# Patient Record
Sex: Female | Born: 1935 | Race: White | Hispanic: No | Smoking: Former smoker
Health system: Southern US, Community
[De-identification: ages and names within clinical notes are randomized; demographics above are authoritative.]

## PROBLEM LIST (undated history)

## (undated) DIAGNOSIS — I739 Peripheral vascular disease, unspecified: Secondary | ICD-10-CM

## (undated) DIAGNOSIS — I480 Paroxysmal atrial fibrillation: Secondary | ICD-10-CM

## (undated) DIAGNOSIS — I499 Cardiac arrhythmia, unspecified: Secondary | ICD-10-CM

## (undated) DIAGNOSIS — I2510 Atherosclerotic heart disease of native coronary artery without angina pectoris: Secondary | ICD-10-CM

## (undated) DIAGNOSIS — I340 Nonrheumatic mitral (valve) insufficiency: Secondary | ICD-10-CM

## (undated) DIAGNOSIS — I272 Pulmonary hypertension, unspecified: Secondary | ICD-10-CM

## (undated) DIAGNOSIS — I10 Essential (primary) hypertension: Secondary | ICD-10-CM

## (undated) DIAGNOSIS — N289 Disorder of kidney and ureter, unspecified: Secondary | ICD-10-CM

## (undated) DIAGNOSIS — K922 Gastrointestinal hemorrhage, unspecified: Secondary | ICD-10-CM

## (undated) DIAGNOSIS — R001 Bradycardia, unspecified: Secondary | ICD-10-CM

## (undated) HISTORY — DX: Atherosclerotic heart disease of native coronary artery without angina pectoris: I25.10

## (undated) HISTORY — DX: Pulmonary hypertension, unspecified: I27.20

## (undated) HISTORY — DX: Nonrheumatic mitral (valve) insufficiency: I34.0

## (undated) HISTORY — DX: Paroxysmal atrial fibrillation: I48.0

## (undated) HISTORY — PX: AMPUTATION: SHX166

## (undated) HISTORY — DX: Peripheral vascular disease, unspecified: I73.9

## (undated) HISTORY — DX: Gastrointestinal hemorrhage, unspecified: K92.2

## (undated) HISTORY — DX: Essential (primary) hypertension: I10

## (undated) HISTORY — PX: CHOLECYSTECTOMY: SHX55

## (undated) HISTORY — DX: Disorder of kidney and ureter, unspecified: N28.9

## (undated) HISTORY — DX: Bradycardia, unspecified: R00.1

## (undated) HISTORY — PX: CORONARY ARTERY BYPASS GRAFT: SHX141

---

## 1999-02-23 ENCOUNTER — Other Ambulatory Visit: Admission: RE | Admit: 1999-02-23 | Discharge: 1999-02-23 | Payer: Self-pay | Admitting: Family Medicine

## 1999-04-04 ENCOUNTER — Encounter: Payer: Self-pay | Admitting: Emergency Medicine

## 1999-04-04 ENCOUNTER — Inpatient Hospital Stay (HOSPITAL_COMMUNITY): Admission: EM | Admit: 1999-04-04 | Discharge: 1999-04-21 | Payer: Self-pay | Admitting: *Deleted

## 1999-04-06 ENCOUNTER — Encounter: Payer: Self-pay | Admitting: *Deleted

## 1999-04-08 ENCOUNTER — Encounter: Payer: Self-pay | Admitting: *Deleted

## 1999-04-09 ENCOUNTER — Encounter: Payer: Self-pay | Admitting: Thoracic Surgery (Cardiothoracic Vascular Surgery)

## 1999-04-14 ENCOUNTER — Encounter: Payer: Self-pay | Admitting: Thoracic Surgery (Cardiothoracic Vascular Surgery)

## 1999-04-15 ENCOUNTER — Encounter: Payer: Self-pay | Admitting: Thoracic Surgery (Cardiothoracic Vascular Surgery)

## 1999-04-16 ENCOUNTER — Encounter: Payer: Self-pay | Admitting: Thoracic Surgery (Cardiothoracic Vascular Surgery)

## 1999-04-17 ENCOUNTER — Encounter: Payer: Self-pay | Admitting: Thoracic Surgery (Cardiothoracic Vascular Surgery)

## 1999-04-18 ENCOUNTER — Encounter: Payer: Self-pay | Admitting: Thoracic Surgery (Cardiothoracic Vascular Surgery)

## 1999-04-19 ENCOUNTER — Encounter: Payer: Self-pay | Admitting: Thoracic Surgery (Cardiothoracic Vascular Surgery)

## 2000-11-05 ENCOUNTER — Encounter: Admission: RE | Admit: 2000-11-05 | Discharge: 2000-12-26 | Payer: Self-pay | Admitting: Family Medicine

## 2001-03-05 ENCOUNTER — Ambulatory Visit (HOSPITAL_COMMUNITY): Admission: RE | Admit: 2001-03-05 | Discharge: 2001-03-05 | Payer: Self-pay | Admitting: Gastroenterology

## 2001-03-05 ENCOUNTER — Encounter (INDEPENDENT_AMBULATORY_CARE_PROVIDER_SITE_OTHER): Payer: Self-pay

## 2001-03-12 ENCOUNTER — Ambulatory Visit (HOSPITAL_COMMUNITY): Admission: RE | Admit: 2001-03-12 | Discharge: 2001-03-12 | Payer: Self-pay | Admitting: Gastroenterology

## 2001-03-12 ENCOUNTER — Encounter (INDEPENDENT_AMBULATORY_CARE_PROVIDER_SITE_OTHER): Payer: Self-pay

## 2001-08-29 ENCOUNTER — Encounter: Payer: Self-pay | Admitting: Family Medicine

## 2001-08-29 ENCOUNTER — Ambulatory Visit (HOSPITAL_COMMUNITY): Admission: RE | Admit: 2001-08-29 | Discharge: 2001-08-29 | Payer: Self-pay | Admitting: Family Medicine

## 2001-10-21 ENCOUNTER — Ambulatory Visit (HOSPITAL_COMMUNITY): Admission: RE | Admit: 2001-10-21 | Discharge: 2001-10-21 | Payer: Self-pay | Admitting: General Surgery

## 2002-10-25 IMAGING — CT CT HEAD W/O CM
1 series · 16 of 28 positions shown, 20 images · non-contrast
Comparison: none

FINDINGS
CLINICAL DATA: HEADACHES.
CT HEAD WITHOUT CONTRAST MEDIA
NO EVIDENCE OF ACUTE INTRACRANIAL ABNORMALITY.  NO MASS OR MASS EFFECT, HYDROCEPHALUS, EXTRA-AXIAL
FLUID COLLECTION, MIDLINE SHIFT, HEMORRHAGE, INFARCT.  ACUTE INFARCT MAY BE MISSED BY CT FOR 24-48
HOURS.  MILD GENERALIZED CEREBRAL VOLUME LOSS SLIGHTLY INCREASED IN THE FRONTAL LOBES IS NOTED.
BONY CALVARIUM AND VISUALIZED PARANASAL SINUSES GROSSLY UNREMARKABLE.
IMPRESSION
NO EVIDENCE OF ACUTE INTRACRANIAL ABNORMALITY.
MILD ATROPHY, SKIGHTLY INCREASED IN THE FRONTAL LOBES BILATERALLY.

[Series 2653: — · axial · 0.40mm/px · z∈[-597,-472]mm · 16 of 28 slices shown, 20 images]
[im 2/28  brain]
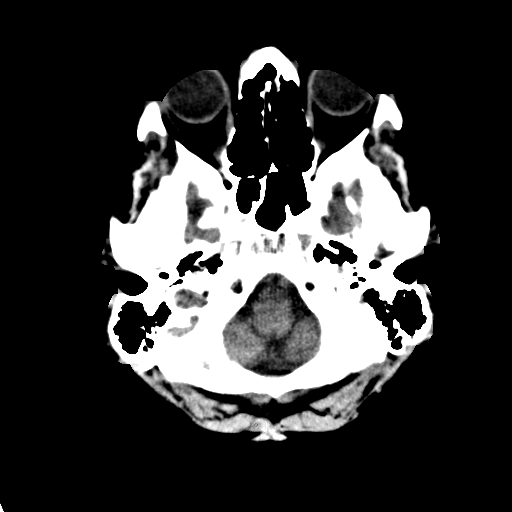
[im 2/28  bone]
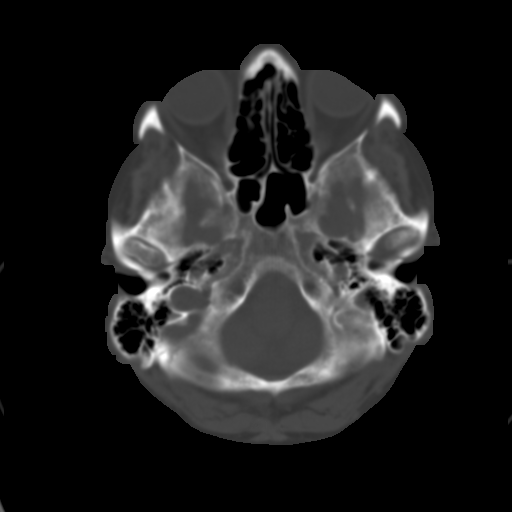
[im 4/28  brain]
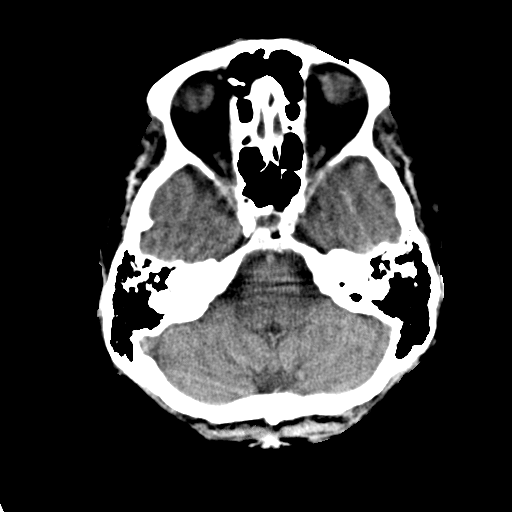
[im 6/28  brain]
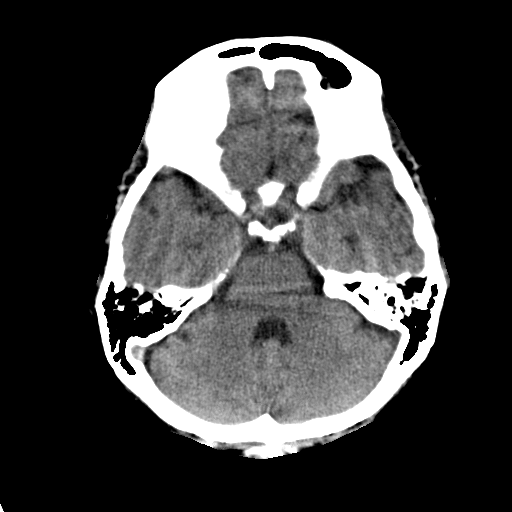
[im 7/28  brain]
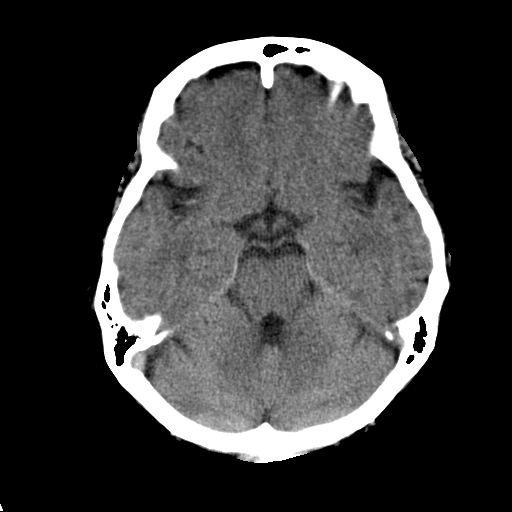
[im 9/28  brain]
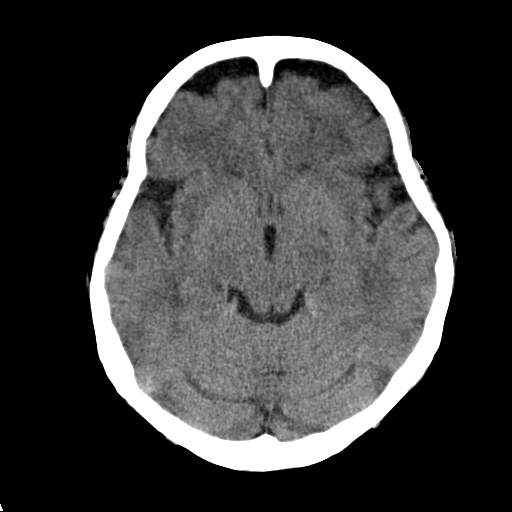
[im 9/28  bone]
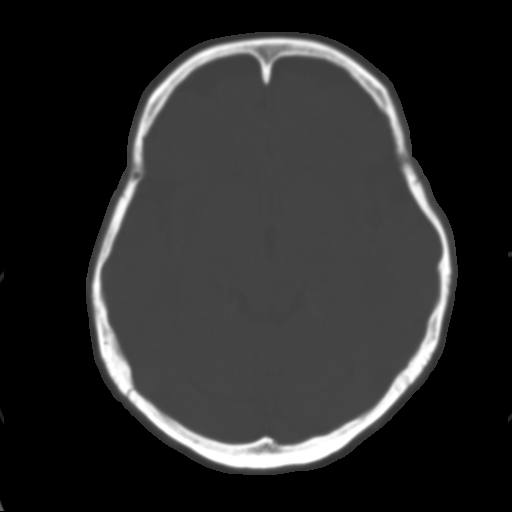
[im 10/28  brain]
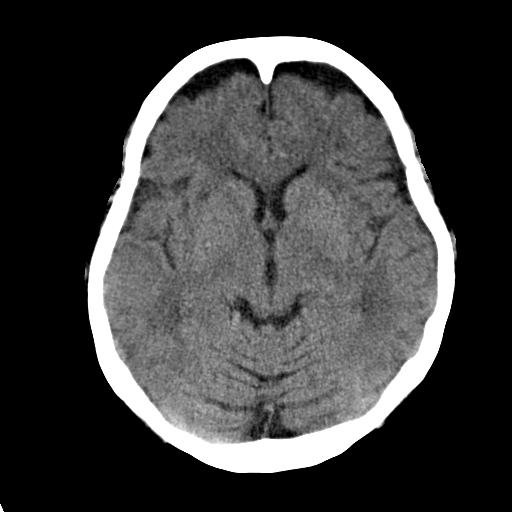
[im 12/28  brain]
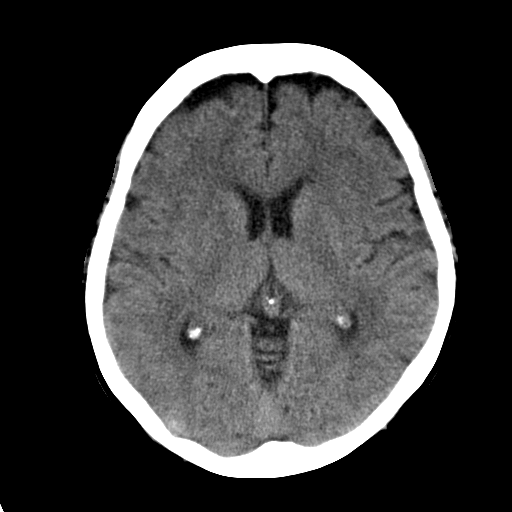
[im 14/28  brain]
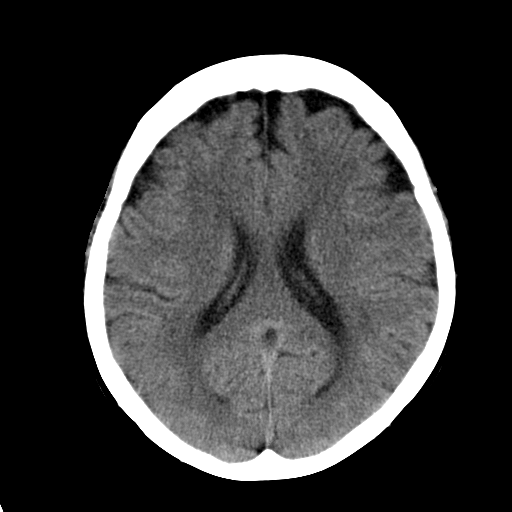
[im 15/28  brain]
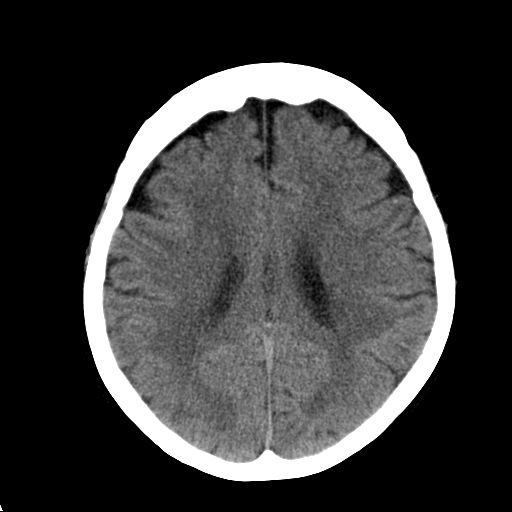
[im 15/28  bone]
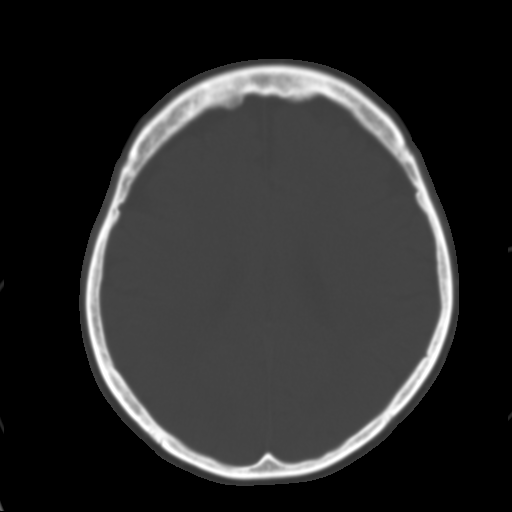
[im 17/28  brain]
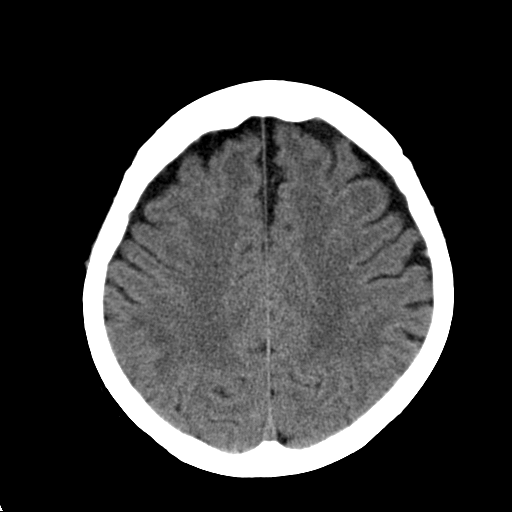
[im 19/28  brain]
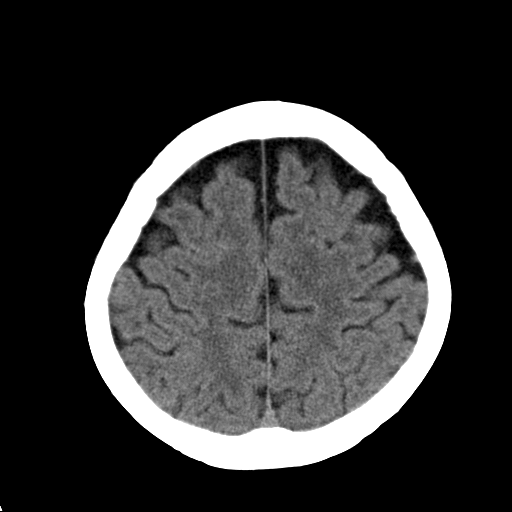
[im 20/28  brain]
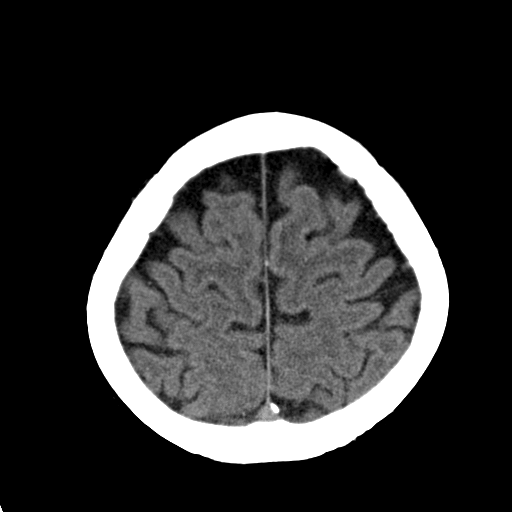
[im 22/28  brain]
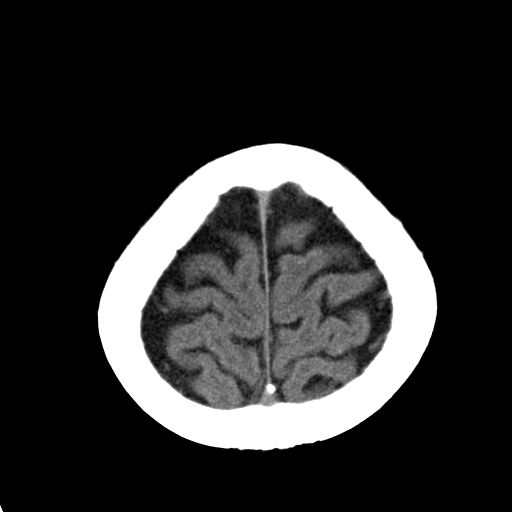
[im 22/28  bone]
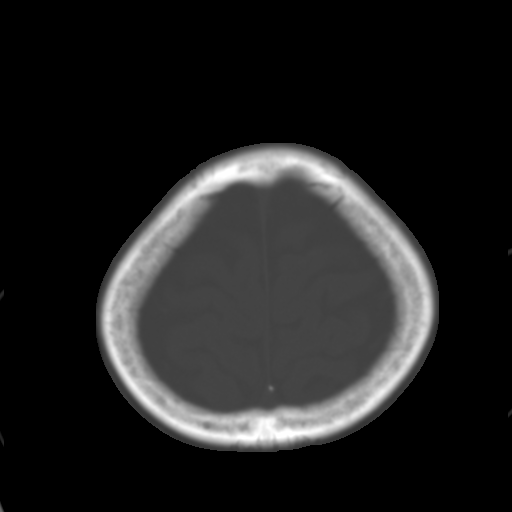
[im 23/28  brain]
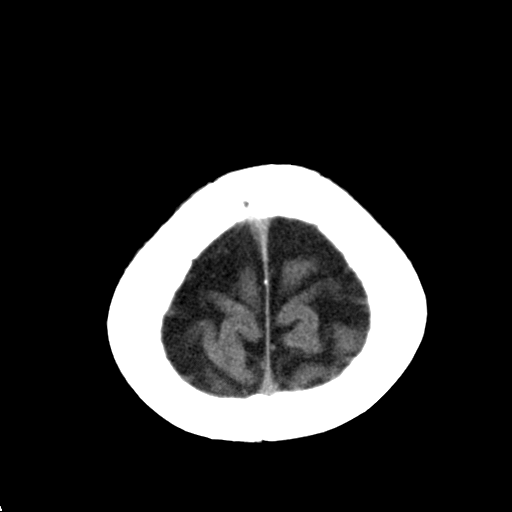
[im 25/28  brain]
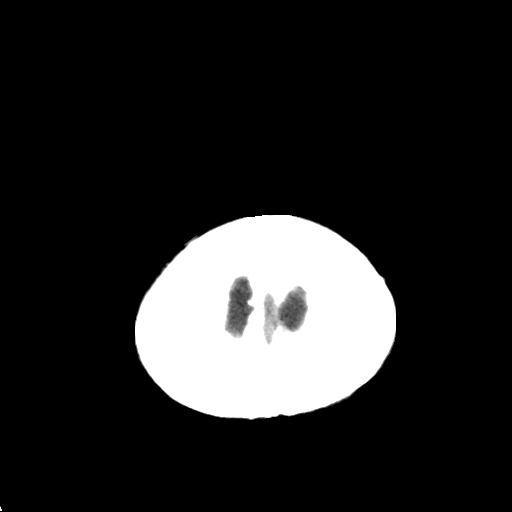
[im 27/28  brain]
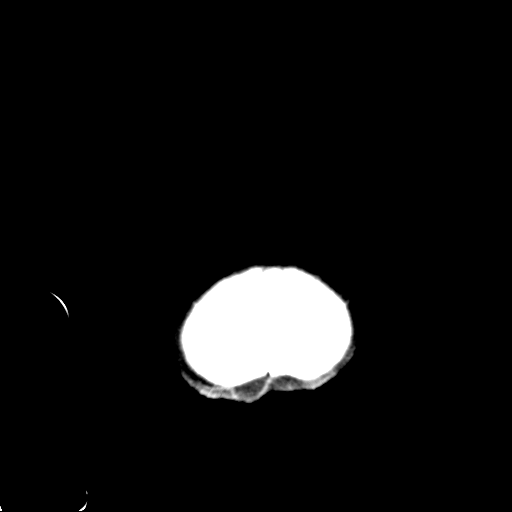

[16 of 28 positions shown; findings below may reference images not displayed]

## 2003-07-09 ENCOUNTER — Ambulatory Visit (HOSPITAL_COMMUNITY): Admission: RE | Admit: 2003-07-09 | Discharge: 2003-07-09 | Payer: Self-pay | Admitting: Cardiology

## 2004-05-04 ENCOUNTER — Ambulatory Visit: Payer: Self-pay | Admitting: Family Medicine

## 2004-08-09 ENCOUNTER — Ambulatory Visit: Payer: Self-pay | Admitting: Family Medicine

## 2004-11-09 ENCOUNTER — Ambulatory Visit: Payer: Self-pay | Admitting: Family Medicine

## 2004-12-14 ENCOUNTER — Ambulatory Visit: Payer: Self-pay | Admitting: Family Medicine

## 2005-02-20 ENCOUNTER — Ambulatory Visit: Payer: Self-pay | Admitting: Family Medicine

## 2005-05-23 ENCOUNTER — Ambulatory Visit: Payer: Self-pay | Admitting: Family Medicine

## 2005-05-24 ENCOUNTER — Ambulatory Visit (HOSPITAL_COMMUNITY): Admission: RE | Admit: 2005-05-24 | Discharge: 2005-05-24 | Payer: Self-pay | Admitting: Family Medicine

## 2005-05-28 ENCOUNTER — Ambulatory Visit: Payer: Self-pay | Admitting: Family Medicine

## 2005-06-18 ENCOUNTER — Ambulatory Visit: Payer: Self-pay | Admitting: Family Medicine

## 2005-08-29 ENCOUNTER — Ambulatory Visit: Payer: Self-pay | Admitting: Cardiology

## 2005-09-26 ENCOUNTER — Ambulatory Visit: Payer: Self-pay | Admitting: Family Medicine

## 2005-11-05 ENCOUNTER — Ambulatory Visit: Payer: Self-pay | Admitting: Family Medicine

## 2005-12-11 ENCOUNTER — Ambulatory Visit: Payer: Self-pay | Admitting: Family Medicine

## 2006-03-18 ENCOUNTER — Ambulatory Visit: Payer: Self-pay | Admitting: Family Medicine

## 2006-06-27 ENCOUNTER — Ambulatory Visit: Payer: Self-pay | Admitting: Family Medicine

## 2006-06-30 ENCOUNTER — Ambulatory Visit: Payer: Self-pay | Admitting: Cardiology

## 2006-06-30 ENCOUNTER — Inpatient Hospital Stay (HOSPITAL_COMMUNITY): Admission: EM | Admit: 2006-06-30 | Discharge: 2006-07-03 | Payer: Self-pay | Admitting: Cardiology

## 2006-06-30 ENCOUNTER — Encounter: Payer: Self-pay | Admitting: Emergency Medicine

## 2006-07-01 ENCOUNTER — Encounter: Payer: Self-pay | Admitting: Cardiology

## 2006-07-31 ENCOUNTER — Ambulatory Visit: Payer: Self-pay | Admitting: Cardiology

## 2006-08-01 ENCOUNTER — Ambulatory Visit: Payer: Self-pay | Admitting: Gastroenterology

## 2006-08-05 ENCOUNTER — Encounter (INDEPENDENT_AMBULATORY_CARE_PROVIDER_SITE_OTHER): Payer: Self-pay | Admitting: Specialist

## 2006-08-05 ENCOUNTER — Ambulatory Visit: Payer: Self-pay | Admitting: Gastroenterology

## 2006-08-14 ENCOUNTER — Ambulatory Visit: Payer: Self-pay | Admitting: Family Medicine

## 2006-09-02 ENCOUNTER — Ambulatory Visit: Payer: Self-pay | Admitting: Family Medicine

## 2006-09-09 ENCOUNTER — Ambulatory Visit: Payer: Self-pay | Admitting: Family Medicine

## 2006-09-16 ENCOUNTER — Ambulatory Visit: Payer: Self-pay | Admitting: Family Medicine

## 2006-09-25 ENCOUNTER — Ambulatory Visit: Payer: Self-pay | Admitting: Family Medicine

## 2006-10-28 ENCOUNTER — Ambulatory Visit: Payer: Self-pay | Admitting: Family Medicine

## 2006-10-30 ENCOUNTER — Ambulatory Visit: Payer: Self-pay | Admitting: Cardiology

## 2007-04-30 ENCOUNTER — Ambulatory Visit: Payer: Self-pay | Admitting: Cardiology

## 2007-12-30 ENCOUNTER — Ambulatory Visit: Payer: Self-pay | Admitting: Vascular Surgery

## 2008-01-05 ENCOUNTER — Ambulatory Visit (HOSPITAL_COMMUNITY): Admission: RE | Admit: 2008-01-05 | Discharge: 2008-01-05 | Payer: Self-pay | Admitting: Vascular Surgery

## 2008-01-05 ENCOUNTER — Ambulatory Visit: Payer: Self-pay | Admitting: Vascular Surgery

## 2008-01-13 ENCOUNTER — Ambulatory Visit: Payer: Self-pay | Admitting: Vascular Surgery

## 2008-01-28 ENCOUNTER — Inpatient Hospital Stay (HOSPITAL_COMMUNITY): Admission: RE | Admit: 2008-01-28 | Discharge: 2008-01-31 | Payer: Self-pay | Admitting: Vascular Surgery

## 2008-01-29 ENCOUNTER — Ambulatory Visit: Payer: Self-pay | Admitting: Vascular Surgery

## 2008-01-29 ENCOUNTER — Encounter: Payer: Self-pay | Admitting: Vascular Surgery

## 2008-02-10 ENCOUNTER — Ambulatory Visit: Payer: Self-pay | Admitting: Vascular Surgery

## 2008-05-19 ENCOUNTER — Ambulatory Visit: Payer: Self-pay | Admitting: Cardiology

## 2008-05-27 ENCOUNTER — Ambulatory Visit: Payer: Self-pay | Admitting: *Deleted

## 2008-06-02 ENCOUNTER — Ambulatory Visit: Payer: Self-pay | Admitting: Vascular Surgery

## 2008-06-17 ENCOUNTER — Inpatient Hospital Stay (HOSPITAL_COMMUNITY): Admission: RE | Admit: 2008-06-17 | Discharge: 2008-06-22 | Payer: Self-pay | Admitting: *Deleted

## 2008-06-17 ENCOUNTER — Ambulatory Visit: Payer: Self-pay | Admitting: Cardiology

## 2008-06-17 ENCOUNTER — Ambulatory Visit: Payer: Self-pay | Admitting: Vascular Surgery

## 2008-06-29 ENCOUNTER — Ambulatory Visit: Payer: Self-pay | Admitting: Cardiology

## 2008-07-07 ENCOUNTER — Ambulatory Visit: Payer: Self-pay | Admitting: Cardiology

## 2008-07-13 ENCOUNTER — Ambulatory Visit: Payer: Self-pay | Admitting: Vascular Surgery

## 2008-08-18 ENCOUNTER — Ambulatory Visit: Payer: Self-pay | Admitting: Cardiology

## 2008-08-31 ENCOUNTER — Ambulatory Visit: Payer: Self-pay | Admitting: Vascular Surgery

## 2008-12-30 ENCOUNTER — Encounter (INDEPENDENT_AMBULATORY_CARE_PROVIDER_SITE_OTHER): Payer: Self-pay | Admitting: *Deleted

## 2009-03-03 ENCOUNTER — Ambulatory Visit: Payer: Self-pay | Admitting: Vascular Surgery

## 2009-03-15 DIAGNOSIS — I4891 Unspecified atrial fibrillation: Secondary | ICD-10-CM | POA: Insufficient documentation

## 2009-03-15 DIAGNOSIS — E119 Type 2 diabetes mellitus without complications: Secondary | ICD-10-CM | POA: Insufficient documentation

## 2009-03-15 DIAGNOSIS — I739 Peripheral vascular disease, unspecified: Secondary | ICD-10-CM | POA: Insufficient documentation

## 2009-03-15 DIAGNOSIS — N259 Disorder resulting from impaired renal tubular function, unspecified: Secondary | ICD-10-CM | POA: Insufficient documentation

## 2009-03-15 DIAGNOSIS — I2510 Atherosclerotic heart disease of native coronary artery without angina pectoris: Secondary | ICD-10-CM | POA: Insufficient documentation

## 2009-03-15 DIAGNOSIS — I080 Rheumatic disorders of both mitral and aortic valves: Secondary | ICD-10-CM | POA: Insufficient documentation

## 2009-03-15 DIAGNOSIS — I10 Essential (primary) hypertension: Secondary | ICD-10-CM

## 2009-03-16 ENCOUNTER — Ambulatory Visit: Payer: Self-pay | Admitting: Cardiology

## 2009-06-21 ENCOUNTER — Encounter (INDEPENDENT_AMBULATORY_CARE_PROVIDER_SITE_OTHER): Payer: Self-pay | Admitting: *Deleted

## 2009-08-18 ENCOUNTER — Ambulatory Visit: Payer: Self-pay | Admitting: Vascular Surgery

## 2009-09-05 ENCOUNTER — Encounter: Payer: Self-pay | Admitting: Cardiology

## 2009-09-07 ENCOUNTER — Ambulatory Visit: Payer: Self-pay | Admitting: Cardiology

## 2009-09-12 ENCOUNTER — Encounter: Payer: Self-pay | Admitting: Cardiology

## 2009-11-02 ENCOUNTER — Encounter: Payer: Self-pay | Admitting: Cardiology

## 2009-12-28 ENCOUNTER — Encounter: Payer: Self-pay | Admitting: Cardiology

## 2010-02-16 ENCOUNTER — Telehealth: Payer: Self-pay | Admitting: Gastroenterology

## 2010-04-13 ENCOUNTER — Ambulatory Visit: Payer: Self-pay | Admitting: Vascular Surgery

## 2010-04-19 ENCOUNTER — Ambulatory Visit: Payer: Self-pay | Admitting: Cardiology

## 2010-04-19 ENCOUNTER — Encounter: Payer: Self-pay | Admitting: Cardiology

## 2010-06-15 NOTE — Miscellaneous (Signed)
  Clinical Lists Changes  Observations: Added new observation of DOPPLER LEG:  DUPLEX:  Monophasic Doppler waveforms noted throughout the fem-fem   bypass graft with an increased velocity of 241 cm/s noted at the   proximal anastomosis level.  Biphasic Doppler waveforms noted throughout   the left lower extremity bypass graft in its native vessels with no   increase in velocities.      IMPRESSION:   1. Patent right-to-left femoral-femoral and left femoral-to-popliteal       bypass grafts with increased velocity noted, as described above.   2. Significant increase of the left ankle brachial index noted when       compared to the previous examination with the right ankle brachial       index remaining stable.  (08/18/2009 9:27)      Venous Doppler  Procedure date:  08/18/2009  Findings:       DUPLEX:  Monophasic Doppler waveforms noted throughout the fem-fem   bypass graft with an increased velocity of 241 cm/s noted at the   proximal anastomosis level.  Biphasic Doppler waveforms noted throughout   the left lower extremity bypass graft in its native vessels with no   increase in velocities.      IMPRESSION:   1. Patent right-to-left femoral-femoral and left femoral-to-popliteal       bypass grafts with increased velocity noted, as described above.   2. Significant increase of the left ankle brachial index noted when       compared to the previous examination with the right ankle brachial       index remaining stable.

## 2010-06-15 NOTE — Letter (Signed)
Summary: [facility]  [facility]   Imported By: Marylou Mccoy 11/25/2009 16:03:50  _____________________________________________________________________  External Attachment:    Type:   Image     Comment:   External Document

## 2010-06-15 NOTE — Letter (Signed)
Summary: Colonoscopy Letter  [facility]  [address] [place], [place] [address]   Phone: [phone]  Fax: [phone]      June 21, 2009 MRN: [id number]   [name] [address] [place], [place]  [address]   Dear [name],   According to your medical record, it is time for you to schedule a Colonoscopy. The American Cancer Society recommends this procedure as a method to detect early colon cancer. Patients with a family history of colon cancer, or a personal history of colon polyps or inflammatory bowel disease are at increased risk.  This letter has beeen generated based on the recommendations made at the time of your procedure. If you feel that in your particular situation this may no longer apply, please contact our office.  Please call our office at [phone] to schedule this appointment or to update your records at your earliest convenience.  Thank you for cooperating with [place] to provide you with the very best care possible.   Sincerely,  Vania Rea. Jarold Motto, M.D.  [facility]astro[facility]ology [facility] [phone]

## 2010-06-15 NOTE — Progress Notes (Signed)
Summary: Schedule Office Visit to Discuss [facility]   Phone Note Outgoing Call Call back at [facility] Phone [phone]   Call placed by: Harlow Mares CMA Duncan Dull),  February 16, 2010 3:25 PM Call placed to: Patient Summary of Call: patient needs to [facility] an office visit to decide about haveing an ECL instead of a colonoscopy. She will call back to schedule an office visit in November she states that she is too busy to make one now. I made sure she had our phone number.  Initial call taken by: Harlow Mares CMA Duncan Dull),  February 16, 2010 3:26 PM

## 2010-06-15 NOTE — Assessment & Plan Note (Signed)
Summary: [facility]      Allergies Added:   Visit Type:  Follow-up Primary Provider:  Dr. Ardeen Garland  CC:  CAD and Atrial Fibrillaiton.  History of Present Illness: The patient returns for six-month followup. Since I last saw her she has had no new cardiovascular complaints. She will occasionally feel palpitations but these are not particularly symptomatic. She does not describe presyncope or syncope. She has no new shortness of breath. She has no chest pressure, neck or arm discomfort. She describes no PND or orthopnea.  Current Medications (verified): 1)  Januvia 100 Mg Tabs (Sitagliptin Phosphate) .Marland Kitchen.. 1 By Mouth Daily 2)  Furosemide 40 Mg Tabs (Furosemide) .Marland Kitchen.. 1 Po  Daily 3)  Crestor 40 Mg Tabs (Rosuvastatin Calcium) .Marland Kitchen.. 1 By Mouth Daiy 4)  Amlodipine Besylate 10 Mg Tabs (Amlodipine Besylate) .Marland Kitchen.. 1 By Mouth Daily 5)  Glimepiride 4 Mg Tabs (Glimepiride) .... 2 By Mouth Daily  Allergies (verified): 1)  ! Sulfa  Past History:  Past Medical History:  1. Paroxysmal atrial fibrillation.   2. Peripheral vascular disease (status post fem-fem bypass in       September 2009).   3. Junctional bradycardia in the setting of hyperkalemia in the past.   4. Gastrointestinal bleeding.   5. Coronary artery disease status post CABG (LIMA to the LAD, SVG to       PDA, SVG to posterolateral, SVG to diagonal 2000).   6. Mitral regurgitation status post St. Jude annuloplasty ring.   7. Mildly reduced ejection fraction (EF 50%).   8. Moderate pulmonary hypertension.   9. Hypertension.   10.Diabetes mellitus.   11.Renal insufficiency in February 2008, acute on chronic.     Past Surgical History: Reviewed history from 03/16/2009 and no changes required. Mitral valve disease status post angioplasty ring in 2000 with CABG.  Cholecystectomy.  Partial amputation of right thumb Lymphocele drained.   Review of Systems       As stated in the HPI and negative for all other  systems.   Vital Signs:  Patient profile:   75 year old female Height:      65 inches Weight:      165 pounds BMI:     27.56 Pulse rate:   66 / minute Resp:     16 per minute BP sitting:   142 / 72  (right arm)  Vitals Entered By: Marrion Coy, CNA (September 07, 2009 3:58 PM)     Physical Exam  General:  Well developed, well nourished, in no acute distress. Head:  normocephalic and atraumatic Eyes:  PERRLA/EOM intact; conjunctiva and lids normal. Mouth:  poor dentition, gums and palate normal. Oral mucosa normal. Neck:  Neck supple, no JVD. No masses, thyromegaly or abnormal cervical nodes. Chest Wall:  well healed sternotomy scar Lungs:  Clear bilaterally to auscultation and percussion. Abdomen:  Bowel sounds positive; abdomen soft and non-tender without masses, organomegaly, or hernias noted. No hepatosplenomegaly. Msk:  Back normal, normal gait. Muscle strength and tone normal. Skin:  Intact without lesions or rashes. Psych:  Normal affect.   Detailed Cardiovascular Exam  Neck    Carotids: Carotids full and equal bilaterally without bruits.      Neck Veins: Normal, no JVD.    Heart    Inspection: no deformities or lifts noted.      Palpation: normal PMI with no thrills palpable.      Auscultation: S1 and S2 within normal limits, no S3, no S4, no clicks, no  rubs, 2/6 apical systolic murmur radiating out the aortic outflow tract, no diastolic murmurs  Vascular    Abdominal Aorta: no palpable masses, pulsations, or audible bruits.      Femoral Pulses: bilateral femoral bruits    Pedal Pulses: absent bilateral posterior tibialis, dorsalis pedis 2+ on the left and absent on the right,    Radial Pulses: normal radial pulses bilaterally.      Peripheral Circulation: no clubbing, cyanosis, or edema noted with normal capillary refill.     EKG  Procedure date:  09/07/2009  Findings:      atrial flutter with 31 conduction, nonspecific T-wave changes  Impression &  Recommendations:  Problem # 1:  ATRIAL FIBRILLATION, PAROXYSMAL (ICD-427.31) I again had a discussion with her about this. She steadfastly refuses Coumadin. With renal insufficiency I do not think she is a reasonable candidate for Pradaxa.  She understands the risk of stroke.  Problem # 2:  HYPERTENSION (ICD-401.9) Her blood pressure is controlled and she will continue the meds as listed.  Problem # 3:  MITRAL REGURGITATION (ICD-396.3) I will reevaluate her might mitral regurgitation in the fall with an echocardiogram. I will see her at that time. I will also be reassessing her mildly reduced ejection fraction.  Problem # 4:  CAD (ICD-414.00) She has no new symptoms. She reports she is having her diabetes and lipids followed closely by her primary physician. No further cardiovascular testing is suggested. Her LDL should be less than 70 and HDL greater than 50.

## 2010-06-15 NOTE — Consult Note (Signed)
Summary: [facility] Office Note  [facility] Office Note   Imported By: Roderic Ovens 10/06/2009 15:38:30  _____________________________________________________________________  External Attachment:    Type:   Image     Comment:   External Document

## 2010-06-15 NOTE — Assessment & Plan Note (Signed)
Summary: [facility]  Medications Added LANTUS 100 UNIT/ML SOLN (INSULIN GLARGINE) as directed      Allergies Added:   Visit Type:  Follow-up Primary Provider:  Dr. Ardeen Garland  CC:  CAD and Atrial Fibrillation.  History of Present Illness: The patient presents for follow up of her CAD and fib.  Since I last saw her she has done well. He has been working in the garden. She has had no shortness of breath. She denies PND or orthopnea. She has no chest pressure, neck or arm discomfort. She has no palpitations, presyncope or syncope.  Current Medications (verified): 1)  Januvia 100 Mg Tabs (Sitagliptin Phosphate) .Marland Kitchen.. 1 By Mouth Daily 2)  Furosemide 40 Mg Tabs (Furosemide) .Marland Kitchen.. 1 Po  Daily 3)  Crestor 40 Mg Tabs (Rosuvastatin Calcium) .Marland Kitchen.. 1 By Mouth Daiy 4)  Amlodipine Besylate 10 Mg Tabs (Amlodipine Besylate) .Marland Kitchen.. 1 By Mouth Daily 5)  Glimepiride 4 Mg Tabs (Glimepiride) .... 2 By Mouth Daily 6)  Lantus 100 Unit/ml Soln (Insulin Glargine) .... As Directed  Allergies (verified): 1)  ! Sulfa  Past History:  Past Medical History: Reviewed history from 09/07/2009 and no changes required.  1. Paroxysmal atrial fibrillation.   2. Peripheral vascular disease (status post fem-fem bypass in       September 2009).   3. Junctional bradycardia in the setting of hyperkalemia in the past.   4. Gastrointestinal bleeding.   5. Coronary artery disease status post CABG (LIMA to the LAD, SVG to       PDA, SVG to posterolateral, SVG to diagonal 2000).   6. Mitral regurgitation status post St. Jude annuloplasty ring.   7. Mildly reduced ejection fraction (EF 50%).   8. Moderate pulmonary hypertension.   9. Hypertension.   10.Diabetes mellitus.   11.Renal insufficiency in February 2008, acute on chronic.     Past Surgical History: Reviewed history from 03/16/2009 and no changes required. Mitral valve disease status post angioplasty ring in 2000 with CABG.  Cholecystectomy.  Partial  amputation of right thumb Lymphocele drained.   Review of Systems       As stated in the HPI and negative for all other systems.   Vital Signs:  Patient profile:   75 year old female Height:      65 inches Weight:      161 pounds BMI:     26.89 Pulse rate:   77 / minute Resp:     16 per minute BP sitting:   122 / 64  (right arm)  Vitals Entered By: Marrion Coy, CNA (April 19, 2010 10:36 AM)  Physical Exam  General:  Well developed, well nourished, in no acute distress. Head:  normocephalic and atraumatic Mouth:  Poor dentition, gums and palate normal. Oral mucosa normal. Neck:  Neck supple, no JVD. No masses, thyromegaly or abnormal cervical nodes. Chest Wall:  well healed sternotomy scar Lungs:  Clear bilaterally to auscultation and percussion. Abdomen:  Bowel sounds positive; abdomen soft and non-tender without masses, organomegaly, or hernias noted. No hepatosplenomegaly. Msk:  Back normal, normal gait. Muscle strength and tone normal. Extremities:  bilateral mild edema Neurologic:  Alert and oriented x 3. Skin:  Intact without lesions or rashes. Cervical Nodes:  no significant adenopathy Inguinal Nodes:  no significant adenopathy Psych:  Normal affect.   Detailed Cardiovascular Exam  Neck    Carotids: Carotids full and equal bilaterally without bruits.      Neck Veins: Normal, no JVD.  Heart    Inspection: no deformities or lifts noted.      Palpation: normal PMI with no thrills palpable.      Auscultation: S1 and S2 within normal limits, no S3, no S4, no clicks, no rubs, 2/6 apical systolic murmur radiating out the aortic outflow tract, no diastolic murmurs  Vascular    Abdominal Aorta: no palpable masses, pulsations, or audible bruits.      Femoral Pulses: bilateral femoral bruits    Pedal Pulses: absent bilateral posterior tibialis, dorsalis pedis 2+ on the left and absent on the right,    Radial Pulses: normal radial pulses bilaterally.       Peripheral Circulation: no clubbing, cyanosis, or edema noted with normal capillary refill.     EKG  Procedure date:  04/19/2010  Findings:      probable junctional rhythm with premature ventricular contractions, right axis deviation, nonspecific T wave inversion  Impression & Recommendations:  Problem # 1:  CAD (ICD-414.00) She has no new symptoms.  No change in therapy is indicated. Orders: EKG w/ Interpretation (93000)  Problem # 2:  ATRIAL FIBRILLATION, PAROXYSMAL (ICD-427.31) She has refused Coumadin in the past. She has no symptomatic arrhythmias. No change in therapy is indicated. Orders: EKG w/ Interpretation (93000)  Problem # 3:  HYPERTENSION (ICD-401.9) Her blood pressure was controlled. She will continue on meds as listed.  Problem # 4:  DM (ICD-250.00) Her labs follow her diabetes cholesterol and renal insufficiency are followed by her primary physician.  Patient Instructions: 1)  Your physician recommends that you continue on your current medications as directed. Please refer to the Current Medication list given to you today. 2)  Your physician recommends that you schedule a follow-up appointment in: 12 months with Dr Antoine Poche in [place]

## 2010-06-15 NOTE — Letter (Signed)
Summary: [facility] Office Progress Note   [facility] Progress Note   Imported By: Roderic Ovens 01/04/2010 11:37:47  _____________________________________________________________________  External Attachment:    Type:   Image     Comment:   External Document

## 2010-08-29 LAB — CBC
HCT: 28 % — ABNORMAL LOW (ref 36.0–46.0)
Hemoglobin: 9.4 g/dL — ABNORMAL LOW (ref 12.0–15.0)
MCHC: 32.3 g/dL (ref 30.0–36.0)
MCV: 81.9 fL (ref 78.0–100.0)
Platelets: 211 10*3/uL (ref 150–400)
RBC: 3.79 MIL/uL — ABNORMAL LOW (ref 3.87–5.11)
WBC: 4.6 10*3/uL (ref 4.0–10.5)
WBC: 6.9 10*3/uL (ref 4.0–10.5)

## 2010-08-29 LAB — GLUCOSE, CAPILLARY
Glucose-Capillary: 117 mg/dL — ABNORMAL HIGH (ref 70–99)
Glucose-Capillary: 119 mg/dL — ABNORMAL HIGH (ref 70–99)
Glucose-Capillary: 126 mg/dL — ABNORMAL HIGH (ref 70–99)
Glucose-Capillary: 129 mg/dL — ABNORMAL HIGH (ref 70–99)
Glucose-Capillary: 135 mg/dL — ABNORMAL HIGH (ref 70–99)
Glucose-Capillary: 198 mg/dL — ABNORMAL HIGH (ref 70–99)
Glucose-Capillary: 79 mg/dL (ref 70–99)
Glucose-Capillary: 84 mg/dL (ref 70–99)
Glucose-Capillary: 92 mg/dL (ref 70–99)

## 2010-08-29 LAB — URINALYSIS, ROUTINE W REFLEX MICROSCOPIC
Glucose, UA: 250 mg/dL — AB
Leukocytes, UA: NEGATIVE
Nitrite: NEGATIVE
Specific Gravity, Urine: 1.015 (ref 1.005–1.030)
pH: 6 (ref 5.0–8.0)

## 2010-08-29 LAB — CULTURE, ROUTINE-ABSCESS: Gram Stain: NONE SEEN

## 2010-08-29 LAB — BASIC METABOLIC PANEL
BUN: 36 mg/dL — ABNORMAL HIGH (ref 6–23)
CO2: 24 mEq/L (ref 19–32)
Calcium: 8.9 mg/dL (ref 8.4–10.5)
Chloride: 100 mEq/L (ref 96–112)
GFR calc Af Amer: 30 mL/min — ABNORMAL LOW (ref >60)
GFR calc non Af Amer: 22 mL/min — ABNORMAL LOW (ref >60)
GFR calc non Af Amer: 25 mL/min — ABNORMAL LOW (ref >60)
Glucose, Bld: 68 mg/dL — ABNORMAL LOW (ref 70–99)
Potassium: 3.7 mEq/L (ref 3.5–5.1)
Potassium: 4.1 mEq/L (ref 3.5–5.1)
Sodium: 138 mEq/L (ref 135–145)

## 2010-08-29 LAB — GRAM STAIN

## 2010-08-29 LAB — ANAEROBIC CULTURE

## 2010-08-29 LAB — URINE MICROSCOPIC-ADD ON

## 2010-08-29 LAB — WOUND CULTURE: Culture: NO GROWTH

## 2010-08-29 LAB — COMPREHENSIVE METABOLIC PANEL
ALT: 23 U/L (ref 0–35)
AST: 28 U/L (ref 0–37)
Albumin: 3.9 g/dL (ref 3.5–5.2)
Calcium: 9.8 mg/dL (ref 8.4–10.5)
Chloride: 102 mEq/L (ref 96–112)
Creatinine, Ser: 1.87 mg/dL — ABNORMAL HIGH (ref 0.4–1.2)
GFR calc Af Amer: 32 mL/min — ABNORMAL LOW (ref >60)
Sodium: 140 mEq/L (ref 135–145)

## 2010-08-29 LAB — APTT: aPTT: 35 seconds (ref 24–37)

## 2010-09-12 ENCOUNTER — Other Ambulatory Visit: Payer: Self-pay | Admitting: Cardiology

## 2010-09-26 NOTE — Assessment & Plan Note (Signed)
[facility]                            CARDIOLOGY OFFICE NOTE   [name], [name]                       MRN:          [id number]  DATE:08/18/2008                            DOB:          14-Jan-1936    PRIMARY CARE PHYSICIAN:  Delaney Meigs, MD   REASON FOR PRESENTATION:  Evaluate the patient with bradycardia and  hypertension.   HISTORY OF PRESENT ILLNESS:  The patient returns for followup of the  above.  I have been following her with a bradyarrhythmia that she had  demonstrated in the hospital recently.  She has not had any symptoms  related to this.  She has had no presyncope or syncope.  She has had  occasional palpitations that are short lived.  She has been followed for  her blood pressure as well.  At the last visit, I was limited in  treatment and she has had contraindications to most medications.  She  kept a blood pressure diary and her blood pressure was in the 160s to  170s systolic and is now in the low 150s.  She has not had any elevated  diastolics.  She has had continued problems with lymphocele that  required surgical treatment.  She has had the drains removed from her  leg.  She is followed by Dr. Edilia Bo and has had decreased lower  extremity swelling.  She is starting to feel better from this.  She is  not having any chest discomfort or shortness of breath.   PAST MEDICAL HISTORY:  1. Paroxysmal atrial fibrillation.  2. Peripheral vascular disease (status post fem-fem bypass in      September 2009).  3. Junctional bradycardia in the setting of hyperkalemia in the past.  4. Gastrointestinal bleeding.  5. Coronary artery disease status post CABG (LIMA to the LAD, SVG to      PDA, SVG to posterolateral, SVG to diagonal).  6. Mitral regurgitation status post St. Jude annuloplasty ring.  7. Mildly reduced ejection fraction (EF 50%).  8. Moderate pulmonary hypertension.  9. Hypertension.  10.Diabetes mellitus.  11.Renal  insufficiency in February 2008, acute on chronic.  12.Cholecystectomy.  13.Partial amputation of right thumb.  14.Lymphocele drained.   ALLERGIES:  CODEINE.   MEDICATIONS:  1. Glimepiride 4 mg b.i.d.  2. Januvia 100 mg daily.  3. Amlodipine 10 mg daily.  4. Furosemide 40 mg daily.  5. Antara b.i.d.  6. Crestor 20 mg daily.   REVIEW OF SYSTEMS:  As stated in the HPI, and otherwise negative for  other systems.   PHYSICAL EXAMINATION:  GENERAL:  The patient is in no distress.  VITAL SIGNS:  Blood pressure 156/68, heart rate 74 and regular, weight  175 pounds, and body mass index 29.  NECK:  Jugular venous distention at the angle of the jaw at 45 degrees;  carotid upstroke brisk and symmetric; no bruits, no thyromegaly.  LYMPHATICS:  No adenopathy.  LUNGS:  Clear to auscultation bilaterally.  BACK:  No costovertebral angle tenderness.  CHEST:  Well-healed sternotomy scar.  HEART:  PMI not displaced or sustained; S1 and  S2 within normal limits;  no S3, no S4; no clicks, no rubs, 2/6 apical systolic murmur radiating  out the aortic outflow tract, no diastolic murmurs.  ABDOMEN:  Flat;positive bowel sounds, normal in frequency and pitch;  midline bruit; no rebound; no guarding; no midline pulsatile mass; no  hepatomegaly, no splenomegaly.  SKIN:  No rashes.  No nodules.  EXTREMITIES:  Upper pulses 2+, unable to appreciate femoral pulses,  unable to appreciate dorsalis pedis and posterior tibialis bilaterally,  moderate right greater than left lower extremity tense edema.  NEURO:  Grossly intact.   ASSESSMENT AND PLAN:  1. Hypertension.  Her blood pressure does remain slightly elevated      than above target.  However, she has contraindications to ACE      inhibitors with renal insufficiency, beta blockers with      bradycardia.  She is on a diuretic and a calcium blocker.  At this      point, I am going to suggest therapeutic lifestyle changes to      include weight loss to try  to improve the situation.  If she does      not have therapeutic target, blood pressures going forward, I will      probably suggest clonidine.  2. Bradycardia.  She has not had any symptoms related to this.  No      further treatment is necessary.  3. Atrial fibrillation.  She has had paroxysms of this.  She refuses      Coumadin.  She refuses aspirin.  She is not symptomatic with this.      No further treatment is suggested.  4. Coronary artery disease as above.  She refuses aspirin.  She will      continue with secondary risk reduction.  5. Followup.  I will see her back in about 6 months or sooner if she      has any problems.     Rollene Rotunda, MD, [facility]  Electronically Signed    JH/MedQ  DD: 08/18/2008  DT: 08/19/2008  Job #: [id number]   cc:   Delaney Meigs, M.D.

## 2010-09-26 NOTE — Procedures (Signed)
BYPASS GRAFT EVALUATION   INDICATION:  Lower extremity bypass graft.   HISTORY:  Diabetes:  yes  Cardiac:  yes  Hypertension:  yes  Smoking:  Previous  Previous Surgery:  Right to left femoral femoral bypass graft and left  femoral popliteal bypass graft on 01/28/2008   SINGLE LEVEL ARTERIAL EXAM                               RIGHT              LEFT  Brachial:                    172                173  Anterior tibial:             135                141  Posterior tibial:            86                 128  Peroneal:  Ankle/brachial index:        0.78               0.82   PREVIOUS ABI:  Date: 08/31/2008  RIGHT:  0.74  LEFT:  0.87   LOWER EXTREMITY BYPASS GRAFT DUPLEX EXAM:   DUPLEX:  Monophasic/ biphasic Doppler waveforms noted throughout the  lower extremity bypass graft and native vessels with increased velocity  of 221 cm/ sec noted near the proximal anastomosis of the femoral  femoral bypass graft.   IMPRESSION:  1. Patent right to left femoral femoral and left femoral popliteal      bypass grafts with mildly increased velocity noted, as described      above.  2. Stable bilateral  ankle brachial indices.   ___________________________________________  [name]. Edilia Bo, M.D.   CH/MEDQ  D:  03/03/2009  T:  03/03/2009  Job:  [id number]

## 2010-09-26 NOTE — Op Note (Signed)
NAMEKELISSA, MERLIN NO.:  [id number]   MEDICAL RECORD NO.:  [id number]          PATIENT TYPE:  INP   LOCATION:  3312                         FACILITY:  [facility]   PHYSICIAN:  [name]. 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   POSTOPERATIVE DIAGNOSIS:  Nonhealing wound of the left leg with  multilevel arterial occlusive disease.   PROCEDURES:  1. Right-to-left fem-fem bypass with 8-mm Dacron graft.  2. Left femoral to above-knee popliteal artery bypass with a non-      reverse translocated saphenous vein graft.  3. Intraoperative arteriogram.   SURGEON:  [name]. Edilia Bo, MD   ASSISTANT:  Jerold Coombe, PA   ANESTHESIA:  General.   INDICATIONS:  This is a 75 year old woman who had developed a small  wound in lateral aspect of her left heel in May.  Despite aggressive  outpatient care, this wound gradually enlarged and she was found to have  multilevel arterial occlusive disease on exam.  Arteriogram showed a  long common iliac artery occlusion on the left and a superficial femoral  artery occlusion on the left and she is brought in for attempted  revascularization as her best chance for limb salvage.   TECHNIQUE:  The patient was taken to the operating room and received a  general anesthetic.  Both groins in the entire left lower extremity were  prepped and draped in the usual sterile fashion.  An oblique incision  was made above the inguinal crease on the left side and through this  incision, the common femoral, superficial femoral, and deep femoral  arteries were controlled.  Of note, the arteries were markedly  calcified, especially the common femoral artery.  There was really no  place to clamp the artery proximally because of the  severe calcific  disease.  Through this incision, the saphenofemoral junction was  dissected free.  Three additional incisions along the medial aspect of  the left leg, the greater saphenous vein was harvested down to the level  of the knee with branches divided between clips and 3-0 silk ties.  A  longitudinal incision was made above the knee through which above-knee  popliteal artery was exposed and appeared to be patent at this level,  although it too was quite calcified.  The tunnel was created between the  2 incisions.  Next, an oblique incision was made in the right groin and  again the common femoral, superficial femoral, and deep femoral arteries  were controlled.  Again, there was marked calcific disease in the common  femoral artery and had to go up high on the external iliac artery in  order to find an area of the artery to clamp.  Next, the tunnel was  created between the 2 groin incisions and 8 mm Dacron graft tunneled  between the 2 incisions.  The patient was then heparinized.  Attention  was first turned  to the right femoral anastomosis.  The external iliac  artery was clamped proximally and then the deep femoral and superficial  femoral arteries were controlled and longitudinal arteriotomy was made  in the common femoral artery.  I had to crush the plaque in order that I  could sew to the artery.  There was severe calcific disease throughout  an endarterectomy analyzing this would have been difficult and then it  would be very difficult to establish an endpoint.  The 8-mm graft was  widely spatulated and sewn end-to-side to the common femoral artery  using continuous 5-0 Prolene suture.  Prior to completing anastomosis,  arteries were back bled and flushed and was good inflow.  The  anastomosis was completed and the graft clamp.  The left limb of the fem-  fem graft was then brought down for anastomosis to the common femoral  artery on the left side.  The superficial  femoral and deep femoral  arteries were controlled.  A longitudinal arteriotomy was made in the  severely diseased artery.  I used a Fogarty balloon to occlude  proximally.  The Dacron grafts was cut to the appropriate length,  spatulated, and sewn end-to-side to the artery using continuous 5-0  Prolene suture.  Next, with the artery still occluded, a longitudinal  graftotomy was made in the hood of the left limb of the fem-fem graft  and here the vein, which had been harvested was anastomosed end to side  to the distal hood of the left limb of the fem-fem graft.  Of note, the  saphenofemoral junction had previously been clamped and the vein excised  from the femoral vein.  The femoral vein oversewn with 5-0 Prolene  suture and the proximal valve had been sharply excised.  With the vein  now sewn to the fem-fem graft, the left femoral anastomosis was  completed and after it was back bled and flushed appropriately.  Next  using retrograde [place] valvulotome, the valves were sharply excised and  the vein was flushed with heparinized saline and clamped.  It was marked  with twisting and then brought through the previously created tunnel.  The tourniquet was placed on the upper thigh.  The leg was exsanguinated  with an Esmarch bandage and the tourniquet inflated to 300 mmHg.  Under  tourniquet control, a longitudinal arteriotomy was made in the above-  knee popliteal artery.  The vein grafts were cut to appropriate length,  spatulated, and sewn end-to-side to the above-knee popliteal artery  using continuous 6-0 Prolene suture parachuting both the heel and toe  vein anastomosis.  At the completion, there was a good Doppler signal in  the foot.  Hemostasis was obtained in the wounds and the heparin was  partially reversed with protamine.  Intraoperative arteriogram had been  obtained, which showed no technical problems.  The groin incisions were  closed with deep layer of 2-0 Vicryl, the  subcutaneous layer with 2-0  Vicryl, and the skin closed with 4-0 subcuticular stitch.  The remaining  incisions were closed with deep layer of 3-0 Vicryl and the skin closed  with staples.  Sterile dressing was applied.  The patient tolerated the  procedure well, was transferred to the recovery room in satisfactory  condition.  All needle and sponge counts were correct.      [name]. Edilia Bo, M.D.  Electronically Signed     CSD/MEDQ  D:  01/28/2008  T:  01/29/2008  Job:  [id number]   cc:  Denny Peon. Ulice Brilliant, D.P.M.  Delaney Meigs, M.D.  Rollene Rotunda, MD, [facility]

## 2010-09-26 NOTE — H&P (Signed)
HISTORY AND PHYSICAL EXAMINATION   June 02, 2008   Re:  [name], [name]                DOB:  Feb 05, 1936   I saw the patient in the office today for continued followup after her  previous bypass.  This is a pleasant 75 year old woman who had presented  with a nonhealing wound on the lateral aspect of her left heel.  Despite  aggressive outpatient care this was enlarging and she was found to have  multilevel arterial occlusive disease.  She underwent an arteriogram  which showed a long segment common iliac artery occlusion and a  superficial femoral artery occlusion on the left and she was brought in  in September of 2009 and underwent a right to left fem-fem bypass with  an 8 mm Dacron graft and a left femoral to above knee popliteal artery  bypass with a nonreverse translocated saphenous vein graft.  She had  moderate amount of swelling develop in the right groin postoperatively  which has been persistent.  She was evaluated in our office earlier this  month by Dr. Madilyn Fireman after she had been seen by Dr. Antoine Poche with a mass  in the right groin.  She was set up to come in today to have an  ultrasound and further evaluation.  Of note, she has had no fever or  chills.  She has had no real pain associated with the swelling in the  right groin.  Of note, the wound on her left foot has healed and she is  having no claudication or rest pain.   PAST MEDICAL HISTORY:  Significant for adult onset diabetes.  She does  not require insulin.  In addition, she has hypertension and  hypercholesterolemia.  She has a remote history of a myocardial  infarction.  She has no history of congestive heart failure.   PAST SURGICAL HISTORY:  Significant for previous coronary  revascularization by Dr. Dorris Fetch in 2000.  Vein was taken from the  right leg for this procedure.   FAMILY HISTORY:  Her mother had cancer.  Her father had an MI at age 29.  She is not aware of any history of  premature cardiovascular disease.   SOCIAL HISTORY:  She is widowed.  She has three children.  She quit  tobacco in 2000.   ALLERGIES:  No known drug allergies.   MEDICATIONS:  1. Glimepiride 4 mg p.o. b.i.d.  2. Januvia 100 mg p.o. daily.  3. Actos 45 mg p.o. daily.  4. Amlodipine 5 mg p.o. daily.  5. Metoprolol 50 mg p.o. b.i.d.  6. Lasix 40 mg p.o. daily.  7. Crestor 20 mg p.o. daily.   REVIEW OF SYSTEMS:  GENERAL:  She has had no recent weight gain, weight  loss or problems with her appetite.  She is 160 pounds.  CARDIAC:  She has had no chest pain, chest pressure, palpitations or  arrhythmias.  PULMONARY:  She has had no productive cough, bronchitis, asthma or  wheezing.  GI:  She does have a history of a hiatal hernia.  She has had no change  in her bowel habits and has no dysphasia.  GU:  She has had no dysuria or frequency.  VASCULAR:  She has had no claudication or rest pain.  Her ulcer on her  left foot has healed.  She has had no history of stroke, TIAs or  amaurosis fugax.  She has no history of DVT or  phlebitis.  NEURO:  She has had no dizziness, blackouts, headaches or seizures.  ORTHO:  She has had no significant arthritis, muscle pain or joint pain.  HEMATOLOGIC:  She has had no bleeding problems or clotting disorders.   PHYSICAL EXAMINATION:  General:  This is a pleasant 75 year old woman  who appears her stated age.  Vital signs:  Her blood pressure is 140/69,  heart rate is 60.  She is afebrile in the office today.  Neck:  Neck is  supple.  There is no cervical lymphadenopathy.  I do not detect any  carotid bruits.  Lungs:  Are clear bilaterally to auscultation.  Cardiac:  She has a regular rate and rhythm.  Abdomen:  Soft and  nontender.  I cannot palpate an aneurysm.  She has a palpable graft  pulse in her fem-fem positive and a palpable popliteal pulse on the  left.  Both feet are warm and well-perfused without ischemic ulcers.  She has moderate swelling  overlying the proximal fem-fem graft on the  right side.  She has incisions above her inguinal crease which are  healed nicely.  There is no significant erythema or drainage to suggest  infection.   She had a Doppler study in our office today which shows an ABI of 72% on  the left and 61% on the right.  Her fem-fem bypass graft is patent and  her left fem-pop bypass graft is patent.  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   In reviewing her operative report her femoral arteries were  significantly calcified bilaterally.  On the right side the 8 mm graft  was sewn end-to-side to the common femoral artery because of her severe  calcific disease a Fogarty balloon had to be used to control the left  common femoral artery proximally.  The left limb of the fem-fem graft  was then sewn end-to-side to the common femoral artery.  A longitudinal  graftotomy was made over the hood of the left limb of the fem-fem graft  and the vein fem-pop bypass graft was sewn to this.   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  Although she is asymptomatic and has had no  evidence of infection I think this could continue to gradually enlarge  and could become infected and compromise her graft.  For this reason I  have recommended exploration and evacuation of the lymphocele.  When  this does occur which is fairly rare it oftentimes is related to seepage  of lymphatic fluid through the graft and the risk is that this could  become a recurrent problem.  Regardless, I am afraid that if this is not  addressed it could become a source of infection or that the size of the  collection could compromise the graft.  The other possibility would be  hematoma although I think this would be less likely as this probably  would have resolved by now and least likely would be infection although   this is certainly a possibility.  If at the time of exploration she is  found to have a grossly infected graft then she would require removal of  her fem-fem graft with a vein patch angioplasty of the femoral arteries  which will be complicated given her severely calcific disease.  The only  option for revascularization on the left would then be an axillofemoral  bypass graft.  Hopefully, however, this is simply a lymphocele and we  can evacuate this.  If there is a probable drainage we could consider  placement of a VAC to help this seal.  She is agreeable to proceeding  with surgery.  We have discussed the indications for the procedure and  the potential complications including but not limited to wound healing  problems, infection and the need to remove the graft if it were clearly  infected.  All of her questions have been answered and she is agreeable  to proceed.  Her surgery has been scheduled for February 4.   [name]. Edilia Bo, M.D.  Electronically Signed   CSD/MEDQ  D:  06/02/2008  T:  06/03/2008  Job:  [id number]   cc:   Delaney Meigs, M.D.  Rollene Rotunda, MD, [facility]  Denny Peon. Ulice Brilliant, D.P.M.

## 2010-09-26 NOTE — Procedures (Signed)
BYPASS GRAFT EVALUATION   INDICATION:  Followup lower extremity bypass graft.   HISTORY:  Diabetes:  yes  Cardiac:  yes  Hypertension:  yes  Smoking:  Previous  Previous Surgery:  Right to left femoral to femoral graft, left femoral  to popliteal bypass graft with saphenous vein.   SINGLE LEVEL ARTERIAL EXAM                               RIGHT              LEFT  Brachial:                    171                178  Anterior tibial:             156                154  Posterior tibial:            85                 117  Peroneal:  Ankle/brachial index:        0.88               0.87   PREVIOUS ABI:  Date: 08/18/2009  RIGHT:  0.77  LEFT:  0.96   LOWER EXTREMITY BYPASS GRAFT DUPLEX EXAM:   DUPLEX:  Monophasic Doppler waveforms were noted throughout the femoral  to femoral bypass graft.  Biphasic Doppler waveforms were noted  throughout the left femoral to popliteal bypass graft.   IMPRESSION:  Stable ankle brachial indices compared to previous study  with a slight drop in the left ankle where there has been an increase in  the right ankle.  Patent right to left femoral to femoral bypass graft  and patent left femoral to popliteal bypass graft with no increased  velocities.         ___________________________________________  [name]. Edilia Bo, M.D.   OD/MEDQ  D:  04/13/2010  T:  04/13/2010  Job:  [id number]

## 2010-09-26 NOTE — Assessment & Plan Note (Signed)
OFFICE VISIT   [name], [name]  DOB:  25-Apr-1936                                       05/27/2008  CHART#:[id number]   The patient underwent right-to-left femoral-femoral bypass and left  femoral-popliteal bypass on January 28, 2008, carried out by Dr.  Edilia Bo.   Recent office visit with Dr. Antoine Poche revealed a mass in the right  groin.  She was referred to the office urgently.   The patient describes this as being present since her surgery.  No pain  associated with it.   BP is 159/72, pulse 61 per minute.   Right-to-left fem-fem graft is patent.  There is an area of induration,  no erythema noted in the right groin.  No tenderness.   This likely represents an underlying lymphocele.   Will arrange an ultrasound and follow up with Dr. Edilia Bo next week.   Balinda Quails, M.D.  Electronically Signed   PGH/MEDQ  D:  05/27/2008  T:  05/28/2008  Job:  [id number]

## 2010-09-26 NOTE — Assessment & Plan Note (Signed)
OFFICE VISIT   [name], [name]  DOB:  02-Oct-1935                                       02/10/2008  CHART#:[id number]   I saw the patient in the office today for followup after her recent  right to left fem-fem bypass graft and left fem-pop bypass graft.  This  is a pleasant 75 year old woman who developed a wound on the lateral  aspect of her left heel in May.  Despite aggressive outpatient care,  this gradually enlarged.  She was found to have evidence of multilevel  arterial occlusive disease.  She underwent a right to left fem-fem  bypass graft with an 8 mm Dacron graft to the left femoral to above-knee  popliteal artery bypass graft with a vein graft on 01/28/2008.  She  comes in for her first outpatient visit.  Overall, she has been doing  quite well.  She has had no claudication or rest pain.  She has had no  fever.   PHYSICAL EXAMINATION:  Blood pressure 126/70, heart rate is 69.  Lungs  are clear bilaterally to auscultation.  Cardiac exam, she has a regular  rate and rhythm.  Her incisions are healing well.  Both feet are warm  and well-perfused.   ABI on the left is improved from 26% to 65%.  Her ABI on the right is  stable.  Overall I am pleased with her progress.  Will put on the  protocol and follow her grafts closely.  I will see her back at our next  protocol scan.  She knows to call sooner if she has problems.   [name]. Edilia Bo, M.D.  Electronically Signed   CSD/MEDQ  D:  02/10/2008  T:  02/11/2008  Job:  [id number]   cc:   Denny Peon. Ulice Brilliant, D.P.M.  Delaney Meigs, M.D.  Rollene Rotunda, MD, [facility]

## 2010-09-26 NOTE — Assessment & Plan Note (Signed)
[facility]                            CARDIOLOGY OFFICE NOTE   [name], [name]                       MRN:          [id number]  DATE:10/30/2006                            DOB:          January 14, 1936    PRIMARY CARE PHYSICIAN:  Delaney Meigs, M.D.   REASON FOR PRESENTATION:  Evaluate patient with coronary disease.   HISTORY OF PRESENT ILLNESS:  Patient presents for followup of the above.  Since I last saw her, she has been doing well.  She had colonoscopy and  EGD.  She had 20 polyps removed.  She has not had any new symptoms.  She  has had some problems with blood pressure and has had amlodipine  recently added by Dr. Lysbeth Galas.  She was also switched to Crestor.  She  denies any new chest pain or shortness of breath.  She has no new  fatigue.  She is not having any palpitations, presyncope or syncope.  She is not having any PND or orthopnea.   PAST MEDICAL HISTORY:  Coronary artery disease, status post CABG (LIMA  to the LAD, SVG to PDA, SVG to posterolateral and SVG to diagonal), #27  Saint Jude annuloplasty ring, ischemic cardiomyopathy (EF currently  50%), moderate pulmonary hypertension, dental extractions prior to  surgery, hypertension, diabetes mellitus, acute renal insufficiency in  February 2008, bradycardia with hyperkalemia, cholecystectomy, status  post partial amputations, right thumb.   ALLERGIES:  None.   MEDICATIONS:  1. Potassium 20 mEq daily.  2. Metoprolol 100 mg daily.  3. Glimepiride 4 mg daily.  4. Iron.  5. Ramipril 10 mg daily.  6. Metformin 500 mg daily.  7. Furosemide 40 mg daily.  8. Actos 45 mg daily.  9. Amlodipine 5 mg daily.  10.Crestor 20 mg daily.   REVIEW OF SYSTEMS:  As stated in the HPI and otherwise negative for  other systems.   PHYSICAL EXAMINATION:  The patient is in no distress.  Blood pressure  148/60, heart rate 67 and regular.  Weight 163 pounds, body mass index  28.  NECK:  No jugular  venous distention at 45 degrees.  Carotid upstroke  brisk and symmetric.  No bruits, no thyromegaly.  LUNGS:  Clear to auscultation bilaterally.  CHEST:  Well-healed sternotomy scar.  HEART:  PMI not displaced or sustained.  S1 and S2 within normal limits.  No S3, no S4, 2/6 apical systolic murmur, early peaking and radiating  out the aortic outflow tract, no diastolic murmurs.  ABDOMEN:  Flat, positive bowel sounds, normal in frequency and pitch.  No bruits, no rebound, no guarding, no midline pulsatile mass, no  organomegaly.  EXTREMITIES:  Two-plus pulses, trace lower edema.   EKG:  Sinus rhythm, rate 67, axis within normal limits, intervals within  normal limits, poor anterior R-wave progression.   ASSESSMENT AND PLAN:  1. Coronary disease:  Patient is having no symptoms related to this.      No further cardiovascular testing is suggested.  She will continue      with secondary risk reduction.  2. Status post valve repair:  She has no symptomatic recurrence of      this.  No further cardiovascular testing is suggested.  3. Hypertension:  Blood pressure is slightly elevated, but she      recently had her amlodipine dose increased.  This will be followed      by Dr. Lysbeth Galas.  4. Followup:  I will see her back in six months and then hopefully      yearly thereafter.     Rollene Rotunda, MD, [facility]  Electronically Signed    JH/MedQ  DD: 10/30/2006  DT: 10/30/2006  Job #: [id number]   cc:   Delaney Meigs, M.D.

## 2010-09-26 NOTE — Consult Note (Signed)
[name], [name] NO.:  [id number]   MEDICAL RECORD NO.:  [id number]          PATIENT TYPE:  INP   LOCATION:  2033                         FACILITY:  [facility]   PHYSICIAN:  Sierra Ancona, MD      DATE OF BIRTH:  19-May-1935   DATE OF CONSULTATION:  06/21/2008  DATE OF DISCHARGE:                                 CONSULTATION   PRIMARY CARDIOLOGIST:  Sierra Rotunda, MD, [facility].   PRIMARY CARE PHYSICIAN:  Sierra Meigs, MD   REASON FOR CONSULTATION:  Junctional versus bradycardia.   HISTORY OF PRESENT ILLNESS:  A 75 year old Caucasian female with known  history of CAD; coronary artery bypass grafting; peripheral vascular  disease; status post fem-fem bypass in September 2009; diabetes and  hypertension who was admitted for evacuation of right groin lymphocele  the patient had episode of junctional rhythm, bradycardia around  midnight lasting approximately 10 seconds.  The patient had a 2-second  pause noted on telemetry.  We are asked to see and to evaluate the  patient was asymptomatic and was asleep.  During this the patient last  saw Dr. Antoine [name] on May 29, 2008 and was without complaint.  The  patient continues to be without complaint of chest pain, shortness of  breath, or dizziness prior to admission.  The patient states that she  sometimes feels a rapid heart rate at night, but it goes away with she  had when she turns over changes position.  Her main complaint was her  right groin area swelling into the pelvic area.  She denies any other  symptoms.   REVIEW OF SYSTEMS:  Positive for rapid heart rate sometimes at night and  some swelling of the right groin into the pelvic area.  Otherwise  negative for chest pain, shortness of breath, nausea, vomiting, diarrhea  or diaphoresis.   PAST MEDICAL HISTORY:  1. Atrial fibrillation.  2. Transiently post bypass grafting.  3. Peripheral vascular disease, status post fem-fem bypass in      September  2009.  4. Junctional arrhythmia in the setting of hyperkalemia.  5. Gastrointestinal bleed neck CAD status post CABG.  6. Ischemic cardiomyopathy.  7. Mitral valve disease status post angioplasty ring in 2000 with      CABG.  8. Diabetes. next hypertension.   SOCIAL HISTORY:  The patient lives at [place], but she has a  brother who lives the next door and family members who are very close in  proximity.  She is disabled.  She is a widow.  She has 2 sons.  She is a  30-pack year smoker, but quit in 2000.  Negative EtOH.  Negative for  drug use.   FAMILY HISTORY:  Mother deceased with cancer.  Father deceased with MI.  She has 1 brother in good health and 1 sister with back trouble.   CURRENT MEDICATIONS:  1. Amlodipine 5 mg daily.  2. Cefazolin 1 g IV q.8 h.  3. Colace 200 mg daily.  4. Low-molecular weight heparin.  5. Lasix 40 mg daily.  6. Glimepiride 4 mg daily.  7. Insulin per sliding scale.  8. Lopressor 50 mg b.i.d.  9. Actos 45 mg daily.  10.Crestor 40 mg daily.  11.Januvia 20 mg q.p.m.   ALLERGIES:  CODEINE.   CURRENT LABORATORIES:  Sodium 138, potassium 3.7, chloride 104, CO2 of  27, BUN 31, creatinine 1.9, and glucose 82.  Hemoglobin 9.4, hematocrit  28.0, white blood cells 4.6, and platelets 171.   Chest x-ray on June 15, 2008; cardiomegaly, no active disease,  probable chronic mild interstitial prominence.  EKG revealing a  junctional rhythm with ventricular rate of 64 beats per minute with  prolonged QT interval of 0.470.  The patient has no prior EKGs to  compare.   PHYSICAL EXAMINATION:  VITAL SIGNS:  Blood pressure 146/82, pulse 62,  respirations 18, temperature 98.0, and O2 sat 95% on room air.  HEENT:  Head is normocephalic and atraumatic.  EYES:  PERRLA.  Mucous membranes of mouth pink and moist.  Tongue is  midline.  NECK:  Supple.  There is no JVD.  No carotid bruits appreciated.  CARDIOVASCULAR:  Regular rate and rhythm, bradycardic  with 1/6 systolic  murmur auscultated.  Pulses were diminished bilaterally in the lower  extremities.  LUNGS:  Clear to auscultation.  ABDOMEN:  Soft and nontender.  Bowel sounds 2+.  EXTREMITIES:  Right groin dressing is noted.  There is some edema noted  into the pelvis.  Lower extremities have a shiny appearance.  NEUROLOGIC:  Cranial nerves II through XII are grossly intact.   IMPRESSION:  1. Transient bradycardia, junctional.  2. Status post removal of right groin lymphocele.  3. Coronary artery disease, status post coronary artery bypass      grafting, 2000 with postoperative atrial fibrillation.  4. History of junctional rhythm with hyperkalemia, currently brady      with junctional rhythm noted.   PLAN:  The patient is seen and examined in by myself and Dr. Marca [name].  The patient has had a 2-second pause x2 on telemetry while  asleep, otherwise has been in junctional rhythm, heart rate in the 60s,  blood pressure preserved.  I would decrease Lopressor to 25 mg b.i.d.  and set up for Holter monitor 48 hours as an outpatient.  We  will increase her Norvasc 10 mg daily, secondary to hypertension and  follow with Dr. Antoine [name].  Appointment has been made for followup with  Dr. Antoine [name] in approximately 2 weeks and our office will call to have  Holter monitor set up on discharge.      Bettey Mare. Lyman Bishop, NP      Sierra Ancona, MD  Electronically Signed    KML/MEDQ  D:  06/21/2008  T:  06/22/2008  Job:  [id number]   cc:   [name], M.D.

## 2010-09-26 NOTE — Consult Note (Signed)
VASCULAR SURGERY CONSULTATION   [name], [name]  DOB:  Sep 11, 1935                                       12/30/2007  CHART#:[id number]   I saw the patient in the office today in consultation concerning a  nonhealing wound of the left heel.  She was referred by Dr. Ulice Brilliant.  This  is a pleasant 75 year old woman who developed a small wound on the  lateral aspect of her left heel in May.  Despite aggressive outpatient  care this wound has gradually enlarged and has really shown no signs of  healing.  She was sent for a vascular consultation.  Of note, she does  describe some left calf claudication which occurs at a fairly short  distance.  She has had no rest pain or history of nonhealing wounds.  She denies any claudication on the right side.   PAST MEDICAL HISTORY:  Her past medical history is significant for adult  onset diabetes.  She does not require insulin.  In addition she has  hypertension, hypercholesterolemia.  She has had a previous myocardial  infarction although she cannot remember the date.  She has congestive  heart failure.   PAST SURGICAL HISTORY:  Significant for previous coronary  revascularization by Dr. Dorris Fetch in 2000.  Vein was taken from the  right leg for this procedure.   FAMILY HISTORY:  Her mother had cancer.  Her father had an MI at age 79.  She is unaware of any other history of premature cardiovascular disease.   SOCIAL HISTORY:  She is widowed.  She has three children.  She quit  tobacco in 2000.   REVIEW OF SYSTEMS AND MEDICATIONS:  Her review of systems and  medications are documented on the medical history form in her chart.   PHYSICAL EXAMINATION:  General:  This is a pleasant 75 year old woman  who appears her stated age.  Vital signs:  Blood pressure is 149/74,  heart rate is 63.  Neck:  Neck is supple.  There is no cervical  lymphadenopathy.  I do not detect any carotid bruits.  Lungs:  Are clear  bilaterally to  auscultation.  Cardiac:  She has a regular rate and  rhythm.  Abdomen:  Soft and nontender.  I cannot palpate an aneurysm.  She has normal pitched bowel sounds.  Vascular:  She has a barely  palpable right femoral pulse.  I cannot palpate a left femoral pulse.  I  cannot palpate popliteal or pedal pulses on either side.   She has monophasic Doppler signals in the right foot and markedly  dampened monophasic Doppler signals in the left foot in the dorsalis  pedis and posterior tibial position bilaterally.  She has had a wound  that measures approximately a centimeter in diameter the lateral aspect  of her left heel with some mild surrounding erythema.  There is no  drainage.  She has no significant lower extremity swelling.   She did have a Doppler study on 12/18/2007 at [facility] which  showed an ABI of 55% on the right and 26% on the left.   Based on her exam and Doppler study I think she has multilevel arterial  occlusive disease on the left with both inflow disease and infrainguinal  arterial occlusive disease.  Given the nonhealing wound and her history  of diabetes clearly this  is a limb threatening situation.  I think  without revascularization the wound will progress and she will  ultimately require primary amputation.  I think her best chance for limb  salvage is to proceed with arteriography to see what options we might  have for revascularization.  If she had a potential iliac lesion on the  left amenable to angioplasty this could potentially be addressed at the  same time of this arteriogram.  Otherwise will make further  recommendations pending results of her arteriogram.  We have discussed  the indications for the procedure and the potential complications  including but not limited to bleeding, arterial injury, arterial  dissection, arterial thrombosis, kidney failure and diarrhea.  All her  questions were answered.  She is agreeable to proceed.  Her procedure  has  been scheduled for 01/05/2008.   [name]. Edilia Bo, M.D.  Electronically Signed  CSD/MEDQ  D:  12/30/2007  T:  12/31/2007  Job:  [id number]   cc:   Denny Peon. Ulice Brilliant, D.P.M.  Delaney Meigs, M.D.  Rollene Rotunda, MD, [facility]

## 2010-09-26 NOTE — Assessment & Plan Note (Signed)
[facility]                            CARDIOLOGY OFFICE NOTE   [name], [name]                       MRN:          [id number]  DATE:07/07/2008                            DOB:          [birth year]    PRIMARY CARE PHYSICIAN:  Delaney Meigs, MD   REASON FOR PRESENTATION:  Evaluate the patient with bradycardia.   HISTORY OF PRESENT ILLNESS:  The patient was recently hospitalized by  Dr. Edilia Bo for management of lymphocele on the right groin.  During  that hospitalization, she was noted to have bradycardia and a 2-second  pause on telemetry.  She was not symptomatic with this.  We discontinued  her metoprolol.  We increased her Norvasc for better blood pressure  control.  She has been home and has still drains in her leg and is  seeing Dr. Edilia Bo for this.  She has had no cardiac complaints.  She  denies any palpitations, presyncope, or syncope.  She has had no chest  discomfort or shortness of breath.   PAST MEDICAL HISTORY:  1. Paroxysmal atrial fibrillation (this was transient, status post      bypass grafting).  2. Peripheral vascular disease (status post fem-fem bypass in      September 2009).  3. Junctional bradycardia in the setting of hyperkalemia in the past.  4. Gastrointestinal bleeding.  5. Coronary artery disease status post CABG (LIMA to the LAD, SVG to      PDA, SVG to posterolateral, SVG to diagonal).  6. Mitral regurgitation with a [employer] annuloplasty ring.  7. Mildly reduced ejection fraction (EF 50%).  8. Moderate pulmonary hypertension.  9. Hypertension.  10.Diabetes mellitus.  11.Acute renal insufficiency in February 2008.  12.Cholecystectomy.  13.Partial amputation of the right thumb.   ALLERGIES AND INTOLERANCES:  CODEINE.   MEDICATIONS:  1. Glimepiride 4 mg b.i.d.  2. Januvia 100 mg daily.  3. Actos 45 mg daily.  4. Amlodipine 10 mg daily.  5. Furosemide 40 mg daily.  6. Crestor 40 mg daily.   REVIEW OF  SYSTEMS:  As stated in the HPI, and otherwise negative for  other systems.   PHYSICAL EXAMINATION:  GENERAL:  The patient is in no distress.  VITAL SIGNS:  Blood pressure 153/54, heart rate 79 and regular, weight  175 pounds, and body mass index 29.  HEENT:  Eyelids unremarkable; pupils equal, round, and react to light;  fundi not visualized, oral mucosa unremarkable.  NECK:  No jugular venous distention at 45 degrees; carotid upstroke  brisk and symmetric; no bruits, no thyromegaly.  LYMPHATICS:  No adenopathy.  LUNGS:  Clear to auscultation bilaterally.  BACK:  No costovertebral angle tenderness.  CHEST:  Well-healed sternotomy scar.  HEART:  PMI not displaced or sustained; S1 and S2 within normal limits;  no S3, 2/6 apical systolic murmur; early peak and radiating at the  aortic outflow tract; no diastolic murmur.  ABDOMEN:  Flat; positive bowel sounds; normal in frequency and pitch; no  bruits, no rebound, no guarding; no midline pulsatile mass; no  organomegaly.  SKIN:  No rashes.  No nodules.  EXTREMITIES:  Upper 2+ pulses; unable to appreciate right femoral, 1+  left femoral, unable to appreciate dorsalis pedis and posterior tibialis  bilaterally, no edema.  NEURO:  Grossly intact.   EKG; sinus rhythm, rate 79, right axis deviation; premature ventricular  contraction; QT prolonged; no acute ST-T wave changes, nonspecific  flattening.   ASSESSMENT AND PLAN:  1. Bradycardia.  The patient is not having any symptomatic      bradycardia.  We stopped the Lopressor.  At this point, there is no      indication for further monitoring or certainly not a pacemaker.      She will let me know if she has any palpitations or dizziness or      presyncope or syncope in the future.  2. Hypertension.  Blood pressure is slightly elevated.  She has been      intolerant of ACE inhibitors with renal insufficiency.  I do not      want to use a beta blocker.  At this point, my choices are a  little      bit limited, and I would like to have her keep a blood pressure      diary to follow up on this, and I gave her instructions on this.  I      will review this in 6 weeks.  3. Coronary artery disease.  The patient has refused aspirin in the      past.  I will continue to address this with her.  She needs no      further testing at this point, but needs continued risk reduction.  4. Diabetes per Dr. Lysbeth Galas.  5. Atrial fibrillation.  The patient has not had any paroxysms with      this.  She has refused aspirin and Coumadin.  No further therapy is      warranted.  6. Mildly reduced ejection fraction.  We will follow this up with      echocardiograms in the future.  She has no shortness of breath at      this point.  7. Moderate pulmonary hypertension is probably secondary to diastolic      dysfunction and is not causing any symptoms.  8. Renal insufficiency.  She has had this followed and it has been      stable with labs in the hospital.  We will continue to follow this,      but we will avoid meds as listed.  9. Hyperlipidemia.  If she has not had a lipid profile at the next      appointment, I will order one      with a goal LDL less than 100 and HDL greater than 50.  For now,      she remains on the Crestor.     Rollene Rotunda, MD, [facility]  Electronically Signed    JH/MedQ  DD: 07/07/2008  DT: 07/08/2008  Job #: [id number]   cc:   Delaney Meigs, M.D.

## 2010-09-26 NOTE — H&P (Signed)
HISTORY AND PHYSICAL EXAMINATION   January 13, 2008   Re:  [name], [name]                DOB:  May 25, 1935   REASON FOR ADMISSION:  Nonhealing wound of the left leg.   HISTORY:  This is a pleasant 75 year old woman who had developed a small  wound in the lateral aspect of her left heel in May.  Despite aggressive  outpatient care, this wound has gradually enlarged and has really shown  no signs of healing.  She had described some left calf claudication that  occurs at a fairly short distance, but no significant rest pain or  previous nonhealing ulcers.  She had no right lower extremity  claudication.   PAST MEDICAL HISTORY:  Significant for adult onset diabetes.  She does  not require insulin.  In addition she has hypertension,  hypercholesterolemia.  She has had a remote history of myocardial  infarction.  She also has a history of congestive heart failure.   PAST SURGICAL HISTORY:  Significant for previous coronary  revascularization by Dr. Dorris Fetch in 2000.  Vein was taken from the  right leg for this procedure.   FAMILY HISTORY:  Mother had cancer.  Father had an MI at age 64.  She is  unaware of any history of premature cardiovascular disease.   SOCIAL HISTORY:  She is widowed.  She has 3 children.  She quit smoking  tobacco in 2000.   ALLERGIES:  No known drug allergies.   MEDICATIONS:  1. Glimepiride 4 mg p.o. b.i.d.  2. Januvia 100 mg p.o. daily.  3. Actos 45 mg p.o. daily.  4. Amlodipine 5 mg p.o. daily.  5. Metoprolol 50 mg p.o. b.i.d.  6. Furosemide 4 mg p.o. daily.  7. Crestor or 20 mg p.o. daily.   REVIEW OF SYSTEMS:  GENERAL:  She has had no weight loss, weight gain,  problem with her appetite or fever.  She is 160 pounds, 5 feet 5 inches  tall.  CARDIAC:  She has had no chest pain, chest pressure, palpitations or  arrhythmias.  PULMONARY:  She has had no recent productive cough, bronchitis, asthma  or wheezing.  GI:  She does  have a history of a hiatal hernia.  She has had no change  in her bowel habits and had no problems swallowing.  GU:  She has had no dysuria or frequency.  VASCULAR:  She has left calf claudication.  She has had no rest pain or  nonhealing ulcers prior to her current ulcer.  She has had no history of  stroke, TIAs or amaurosis fugax.  She has had no history of DVT or  phlebitis.  NEURO:  She has had no dizziness, blackouts, headaches or seizures.  ORTHO:  She has had no arthritis, joint pain, muscle pain or rash.  PSYCHIATRIC:  She had no depression or nervousness.  HEMATOLOGIC:  She has had no bleeding problems or clotting disorders.   PHYSICAL EXAMINATION:  This is a pleasant 75 year old woman who appears  her stated age.  Blood pressure is 150/62, heart rate is 63.  Neck is  supple.  There is no cervical lymphadenopathy.  I do not detect any  carotid bruits.  Lungs:  Are clear bilaterally to auscultation.  Cardiac  exam, she has a regular rate and rhythm.  Abdomen:  Soft and nontender.  I cannot palpate an aneurysm.  She has normal pitched bowel sounds.  She  has  a palpable but diminished right femoral pulse.  I cannot palpate a  left femoral pulse.  I cannot palpate popliteal or pedal pulses on  either side.  She has monophasic Doppler signals in the right foot and a  markedly dampened monophasic Doppler signal in the left foot in the  dorsalis pedis and posterior tibial positions.  The wound measures  approximately a cm in diameter on the lateral aspect of the left heel  with no significant surrounding erythema and no drainage currently.  She  has no significant lower extremity swelling.   Previous Doppler study at [facility] showed an ABI of 55% right and 26% on  the left.   She underwent arteriogram which showed a left common iliac artery  occlusion with reconstitution of the external iliac artery on the left.  On the right side she has some mild disease of the common iliac  artery  but really no focal stenosis.  She has bilateral superficial femoral  artery occlusions with reconstitution of the above-knee popliteal  arteries.  She has some tibial occlusive disease bilaterally.   I have explained I think her best chance for limb salvage is a right to  left fem-fem bypass graft and left fem to above-knee pop bypass.  She  has undergone vein mapping and appears to have a reasonable greater  saphenous vein on the left side.  She has had a vein taken from the  right leg previously.  She may have a stenosis in the right common  femoral artery and if in fact that this is found, we may use some vein  to do a patch angioplasty on the right prior to placing a fem-fem graft.  I have discussed indications for the procedure and the potential  complications including but not limited to wound healing problems,  bleeding, graft infection, graft thrombosis, and limb loss.  All of her  questions were answered.  She is agreeable to proceed.  She did not  schedule her case until mid September and she is scheduled for September  16.  I have instructed her to keep a close eye on her wound and notify  us if there is any progression at all of her heel ulcer.   [name]. Edilia Bo, M.D.  Electronically Signed   CSD/MEDQ  D:  01/13/2008  T:  01/14/2008  Job:  [id number]   cc:   Denny Peon. Ulice Brilliant, D.P.M.  Delaney Meigs, M.D.  Rollene Rotunda, MD, [facility]

## 2010-09-26 NOTE — Op Note (Signed)
[name], [name] NO.:  [id number]   MEDICAL RECORD NO.:  [id number]          PATIENT TYPE:  INP   LOCATION:  2033                         FACILITY:  [facility]   PHYSICIAN:  Loreta Ave, MD DATE OF BIRTH:  03-17-1936   DATE OF PROCEDURE:  DATE OF DISCHARGE:                               OPERATIVE REPORT   PREOPERATIVE DIAGNOSIS:  Right groin seroma with exposed Dacron.   POSTOPERATIVE DIAGNOSIS:  Right groin seroma with exposed Dacron.   PROCEDURE PERFORMED:  Washout of right groin seroma cavity with  debridement of seroma cavity and rectus femoris muscle flap coverage.   SURGEON:  Loreta Ave, MD   ANESTHESIA:  General endotracheal anesthesia.   ASSISTANT:  None.   COMPLICATIONS:  None.   SPECIMENS:  Aerobic and anaerobic cultures were taken from and around  the Dacron graft and sent to microbiology.   DRAINS:  Jackson-Pratt x2 in the right thigh.   ESTIMATED BLOOD LOSS:  40 mL.   IV FLUIDS:  800 mL of crystalloid.   URINE OUTPUT:  Not recorded.   INDICATIONS:  [name] is a 75 year old female with peripheral  vascular disease.  In September 2009, she underwent a right-to-left fem-  fem bypass by Dr. Durwin Nora.  She recovered well, but presented with a  chronic seroma cavity several weeks ago to clinic.  She was brought to  the operating room 3 days ago where her right groin lymphocele was  washed out.  Cultures were taken.  Cultures to date have grown nothing.  She presents at this time for debridement of the right groin lymphocele  cavity and rotation of her right rectus femoris muscle flap to  obliterate dead space above the graft.   A discussion of the risks of surgery was held with [name] which  include but are not limited to bleeding, infection, damage to the nearby  structures, failure to obliterate dead space, failure to cure chronic  lymphocele, and the need for future surgery, the need for eventual  Dacron graft  removal.  [name] understands these risks and desires to  proceed.   DESCRIPTION OF OPERATION:  The patient was brought to the operating room  and placed in the supine position on the operating table.  After a  smooth and routine induction of general anesthesia, the right groin and  thigh were prepped with chlorhexidine and draped into a sterile field.  Sutures removed from the skin and the Vicryl in the superficial fascia  was removed.  This exposed an 8 x 10 x 6 the lymphocele cavity over the  fem-fem bypass conduit.  The bypass graft had some fibrinous material  adherent to it.  This was abraded off gently and sent for culture.  The  anterior wall of the lymphocele cavity was excised.  Next, a Bovie  scratch pad was used to abrade the posterior wall of the lymphocele  cavity.  The Dacron graft was adhered to a portion of the posterior wall  and the Dacron graft was gently abraded as well.  There was no bleeding  from the graft.  Once it was noted to be a large cavity overlying the  Dacron graft, decision was made to definitively obliterate the dead  space with the rectus femoris muscle flap.  A linear incision was made  overlying the muscle at the mid thigh.  Skin was incised with a 15 blade  and dissection proceeded down to the muscle fascia with electrocautery.  Next, the muscle was elevated off the surrounding quadriceps with blunt  dissection.  Electrocautery was used to dissect underneath the muscle  progressing distally and transecting the rectus femoris at its insertion  onto the patellar tendon.  Dissection proceeded superiorly on the  undersurface of the muscle with electrocautery.  Once the muscle was  freed from its fascial attachments, a subfascial tunnel was made to the  right groin wound.  Blunt dissection was used from the thigh wound  tunneling superiorly and electrocautery was used from the inferior  aspect of the right groin wound to create the tunnel.  The tunnel  was  created to be twice the size of the diameter of the rectus femoris  muscle at its superior most location.  Next, the muscle was passed from  inferior to superior and allowed to sit tension-free in the right groin  wound.   The right thigh was irrigated with normal saline.  The 0 Vicryl figure-  of-eight stitches were placed in the muscle fascia.  Two round 19-French  Blake drains were placed via separate stab incisions and tunneled under  the rectus femoris muscle fascia and into the right groin wound.  Next,  2-0 vertical mattress nylon sutures and staples were used to close the  right thigh wound.  Next, attention was turned to the right groin.   The muscle was found to drape in tension-free manner well past the  superior most aspect of the superior/medial most aspect of the wound.  The 0 Vicryl sutures were placed in an interrupted fashion to tack the  rectus femoris muscle to the superior most aspect of the wound and the  medial most aspect of the wound.  This allowed the muscle to double back  on itself and the distal most extent of the muscle was then tacked out  laterally.  This obliterated the entire lymphocele cavity.  Care was  taken not to place any stitches near the Dacron graft.  Next, the wound  was irrigated copiously with normal saline and the superficial fascia  was closed with a running 3-0 Monocryl stitch.  Next, interrupted buried  3-0 Monocryl sutures were placed in the dermis and Dermabond was applied  to the skin.  Sterile dressing was applied to the right thigh wound as  was a circumferential Ace wrap.  Sponge and needle counts were reported  as correct x2.  The patient was extubated and transferred to the  recovery room in stable condition.      Loreta Ave, MD  Electronically Signed     CF/MEDQ  D:  06/21/2008  T:  06/22/2008  Job:  [id number]

## 2010-09-26 NOTE — Assessment & Plan Note (Signed)
OFFICE VISIT   LIBERTIE, HAUSLER  DOB:  [birth year]                                       08/31/2008  CHART#:[id number]   I saw the patient for followup of her previous bypass.  The patient had  a right to left fem-fem bypass with an 8 mm Dacron graft and a left  femoral to above knee pop bypass with a vein graft in September of 2009.  She later developed a lymphocele in the right groin and underwent  evacuation and placement of a VAC in February of 2010.  However, I asked  Dr. Noelle Penner from plastic surgery to evaluate the patient and he  recommended a rectus femoris flap which was performed on February 8.  She did well from that standpoint and this wound has now completely  healed.  She comes in for a routine graft study.   On examination blood pressure is 148/73, heart rate is 62.  Her  incisions have all healed nicely.   Duplex scan today shows that her fem-fem bypass graft is working well  and her fem-pop bypass graft is patent.  She has an ABI of 74% on the  right and 87% on the left.  She does have significant edema bilaterally.   I have reassured her that both of her grafts are working well.  We have  discussed the importance of intermittent leg elevation to help with  erythema.  I plan on seeing her back in 6 months with followup ABIs.  She knows to call sooner if she has problems.   [name]. Edilia Bo, M.D.  Electronically Signed   CSD/MEDQ  D:  08/31/2008  T:  09/01/2008  Job:  [id number]   cc:   Rollene Rotunda, MD, [facility]  Delaney Meigs, M.D.  Denny Peon. Ulice Brilliant, D.P.M.  Loreta Ave, MD

## 2010-09-26 NOTE — Assessment & Plan Note (Signed)
[facility]                            CARDIOLOGY OFFICE NOTE   KATY, BRICKELL                       MRN:          [id number]  DATE:05/19/2008                            DOB:          06-19-1935    PRIMARY CARE PHYSICIAN:  Delaney Meigs, M.D.   REASON FOR PRESENTATION:  Evaluate the patient with coronary disease.   HISTORY OF PRESENT ILLNESS:  The patient presents for followup of the  above.  Since I last saw her, she had treatment for nonhealing left leg  wound.  She had right to left fem-fem bypass with a 8-mm Dacron graft.  She had left femoral to above the knee popliteal bypass graft.  She  actually did relatively well with this surgery.  Of note, she has had  atrial fibrillation documented.  So, she had some junctional arrhythmia.  I did have her on Coumadin last year.  However, this was discontinued  because she apparently had very significant bruising in under her skin.  She also had a history of GI bleeding with multiple colonic polyps.  She  did not want to take Coumadin.  In fact, she does not take aspirin  because it bothers her stomach.  We talked about this.  She will take it  sporadically, but refuses to take it routinely.   She says she is getting along relatively well.  She is complaining of  mass in her right groin that came up several weeks after surgery, after  they had removed staples.  She has yet to show this to Dr. Edilia Bo.  She  has not had any chest pressure, neck, or arm discomfort.  She has not  had any palpitation, presyncopal, or syncope.  She denies any PND or  orthopnea.  She says her legs do feel better since surgery.   PAST MEDICAL HISTORY:  1. Coronary artery disease status post CABG (LIMA to the LAD, SVG to      PDA, SVG to posterolateral, SVG to diagonal).  2. #27 St. Jude annuloplasty ring.  3. Ischemic cardiomyopathy (EF currently 50%).  4. Moderate pulmonary hypertension.  5. Atrial fibrillation.  6. Junctional rhythm.  7. Dental extractions prior to surgery.  8. Hypertension.  9. Diabetes mellitus.  10.Acute renal insufficiency in February 2008.  11.Bradycardia with hyperkalemia.  12.Peripheral vascular disease with surgeries as described above.  13.Cholecystectomy.  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   ALLERGIES:  None.   MEDICATIONS:  1. Glimepiride 4 mg daily.  2. Furosemide 40 mg daily.  3. Actos 45 mg daily.  4. Amlodipine 5 mg daily.  5. Crestor 20 mg daily.  6. Januvia.  7. Metoprolol 50 mg b.i.d.   REVIEW OF SYSTEMS:  As stated in the HPI and otherwise negative for  other systems.   PHYSICAL EXAMINATION:  GENERAL:  The patient is in no distress.  VITAL SIGNS:  Blood pressure 160/70, heart rate 61 and irregular, weight  178 pounds, and body mass index 29.  HEENT:  Eyes unremarkable.  Pupils equal, round, and reactive to light.  Fundi  not visualized.  Oral mucosa unremarkable.  NECK:  No jugular venous distention at 45 degrees, carotid upstroke  brisk and symmetric.  No bruits.  No thyromegaly.  LYMPHATICS:  No cervical, axillary, or inguinal adenopathy.  LUNGS:  Clear to auscultation bilaterally.  BACK:  No costovertebral angle tenderness.  CHEST:  Well-healed sternotomy scar.  HEART:  PMI not displaced or sustained, S1 and S2 within normal limits.  No S3, 2/6 apical systolic murmur early peak and radiating at the aortic  outflow tract, no diastolic murmurs.  ABDOMEN:  Flat, positive bowel sounds, normal in frequency and pitch.  No bruits, no rebound, no guarding.  No midline pulsatile mass.  No  hepatomegaly.  No splenomegaly.  SKIN:  No rashes.  No nodules.  EXTREMITIES:  Upper pulses 2+, unable to appreciate right femoral as she  has a large mass in her groin that is nonpulsatile, nonfluctuant,  nontender, nonerythematous, right femoral pulses 1+, unable to  appreciate dorsalis pedis and posterior tibialis bilaterally, no edema.   NEURO:  Oriented to person, place, and time, cranial nerves II-XII  grossly intact, motor grossly intact.   EKG is regular rhythm, no P-waves clearly seen, it may represent a  junctional rhythm.  No acute ST-wave changes.   ASSESSMENT AND PLAN:  1. Coronary artery disease.  She is having no ongoing symptoms.  No      further cardiovascular testing as suggested.  She will continue      with risk reduction.  2. Peripheral vascular disease.  She is status post revascularization      surgery as described.  I am worried about this large mass in her      right groin.  She was having some trouble getting in to see Dr.      Durwin Nora and apparently, it was not due to see him until this spring.      I am going to move this up to have her seen as soon as possible to      evaluate this.  3. Atrial fibrillation.  She has had evidence of this, but refuses      Coumadin with apparent significant bruising and history of      bleeding.  She in fact refuses aspirin.  4. Diabetes mellitus, per Dr. Lysbeth Galas.  5. Followup.  I will see the patient back in 6 months or sooner if she      has any problems.     Rollene Rotunda, MD, [facility]  Electronically Signed    JH/MedQ  DD: 05/19/2008  DT: 05/20/2008  Job #: [id number]   cc:   Delaney Meigs, M.D.

## 2010-09-26 NOTE — Op Note (Signed)
[name], [name]                ACCOUNT NO.:  [id number]   MEDICAL RECORD NO.:  [id number]          PATIENT TYPE:  AMB   LOCATION:  SDS                          FACILITY:  [facility]   PHYSICIAN:  [name]. Edilia Bo, M.D.DATE OF BIRTH:  Dec 20, 1935   DATE OF PROCEDURE:  01/05/2008  DATE OF DISCHARGE:                               OPERATIVE REPORT   PREOPERATIVE DIAGNOSIS:  Nonhealing wound on the left heel with  multilevel arterial occlusive disease.   POSTOPERATIVE DIAGNOSIS:  Nonhealing wound on the left heel with  multilevel arterial occlusive disease.   PROCEDURE:  1. Ultrasound-guided access to the right common femoral artery.  2. Aortogram with bilateral iliac arteriogram and bilateral lower      extremity runoff.   SURGEON:  [name]. Edilia Bo, MD   INDICATIONS:  This is a 75 year old woman who had developed a wound  along the lateral aspect of the left heel in May.  Despite aggressive  outpatient care, this has failed to heal.  On examination, she had no  femoral pulse in the left with markedly dampened flow in the left foot  and an ABI of 26%.  She was brought in for diagnostic arteriography to  see what options she might have for revascularization.   TECHNIQUE:  The patient was taken to the operating room and received 1/2  mg of Versed and 25 mcg of fentanyl.  Both groins were prepped and  draped in the usual sterile fashion.  Under ultrasound guidance and  after the skin was anesthetized, the right common femoral artery was  cannulated and a guidewire was introduced into the infrarenal aorta  under fluoroscopic control.  A 5-French sheath was introduced over the  wire.  A pigtail catheter was then positioned at the L1 vertebral body  and flush aortogram obtained.  The catheter was then repositioned above  the aortic bifurcation and oblique iliac projections were obtained and  bilateral lower extremity runoff films were obtained.  I then exchanged  the pigtail  catheter for an end-hole catheter and measured a pressure  gradient across the right iliac system.  There was minimal resting  gradient across the iliac system on the right.   FINDINGS:  There are single renal arteries bilaterally with no  significant renal artery stenosis identified.  The left common iliac  artery is occluded at its origin of this reconstitution of the external  iliac artery on the left via collaterals from the middle, sacral, and  right hypogastric artery.  On the right side, there is some moderate  diffuse disease of the right common iliac artery.  However, again there  was really minimal gradient noted across this area and no focal stenosis  identified.  The hypogastric artery is patent bilaterally.  The right  external iliac artery is patent.  There is some mild disease of the  right common femoral artery.  The superficial femoral artery on the  right is occluded at its origin with reconstitution of the above-knee  popliteal artery.  There is two-vessel runoff seen on the right via the  anterior tibial and posterior  tibial arteries which have moderate  diffuse disease.  The peroneal artery otherwise is occluded.   On the left side, the external iliac, common femoral, and deep femoral  artery are widely patent.  The superficial femoral artery is occluded at  its origin with reconstitution of the above-knee popliteal artery.  There is three-vessel runoff on the left although there is moderate  diffuse disease throughout all tibial vessels on the left.   CONCLUSIONS:  1. Left common iliac artery occlusion.  2. Bilateral superficial femoral artery occlusions.  3. Tibial occlusive disease as described above.      [name]. Edilia Bo, M.D.  Electronically Signed     CSD/MEDQ  D:  01/05/2008  T:  01/06/2008  Job:  [id number]   cc:   Denny Peon. Ulice Brilliant, D.P.M.  Rollene Rotunda, MD, [facility]  Delaney Meigs, M.D.

## 2010-09-26 NOTE — Assessment & Plan Note (Signed)
[facility]                            CARDIOLOGY OFFICE NOTE   [name], [name]                       MRN:          [id number]  DATE:04/30/2007                            DOB:          [birth year]    PRIMARY CARE PHYSICIAN:  [name], M.D.   REASON FOR PROCEDURE:  Evaluate patient with coronary disease.   HISTORY OF PRESENT ILLNESS:  Patient returns to followup of the above.  She has been doing very well since I last saw her.  She has been having  no chest discomfort, neck or arm discomfort.  She has been having no  palpitations, presyncope, or syncope.  She denies any PND or orthopnea.  Her blood pressure, she says, has been well controlled.   PAST MEDICAL HISTORY:  Coronary artery disease, status post CABG (LIMA  to the LAD, SVG to PDA, SVG to posterolateral, and SVG to diagonal).  A  #27 St. Jude annuloplasty ring, ischemic cardiomyopathy (EF currently  50%), moderate pulmonary hypertension, dental extractions prior to  surgery, hypertension, diabetes mellitus, acute renal insufficiency in  February, 2008, bradycardia with hyperkalemia, cholecystectomy, status  post partial amputation of the right thumb.   ALLERGIES:  None.   CURRENT MEDICATIONS:  1. Metoprolol 100 mg daily.  2. Glimepiride 4 mg daily.  3. Iron.  4. Ramipril 10 mg daily.  5. Furosemide 40 mg daily.  6. Actos 45 mg daily.  7. Amlodipine 5 mg daily.  8. Crestor 20 mg daily.  9. Januvia 100 mg daily.   REVIEW OF SYSTEMS:  As stated in the HPI and otherwise negative for  other systems.   PHYSICAL EXAMINATION:  Patient is in no distress.  Blood pressure 168/82, heart rate 58 and regular. Weight 165 pounds.  Body Mass Index 28.  HEENT:  Eyelids unremarkable.  Pupils are equal, round and reactive to  light.  Fundi not visualized.  Oral mucosa unremarkable.  NECK:  No jugular venous distention.  Waveform within normal limits.  Carotid upstroke brisk and  symmetric.  No bruits, no thyromegaly.  LYMPHATICS:  No adenopathy.  LUNGS:  Clear to auscultation bilaterally.  BACK:  No costovertebral angle tenderness.  CHEST:  Unremarkable.  HEART:  PMI not displaced or sustained.  S1 and S2 within normal limits.  No S3, a 2/6 apical systolic murmur, early peaking and radiating out the  aortic outflow tract.  No diastolic murmurs.  ABDOMEN:  Flat, positive bowel sounds.  Normal in frequency and pitch.  No bruits, rebound, guarding.  There are no midline pulsatile masses.  No organomegaly.  SKIN:  No rashes, no nodules.  EXTREMITIES:  Pulses 2+ throughout.  No edema.   EKG:  Atrial fibrillation, rate within normal limits, axis within normal  limits, left ventricular hypertrophy with right voltage criteria, QTC  slightly prolonged.   ASSESSMENT/PLAN:  1. Atrial fibrillation:  This is a new finding for this patient.  I am      going to look back and see if she has had any history of this,      although I do  not recall this.  She looks like she has a reasonable      rate, and I will apply a 24-hour Holter monitor to judge this.  I      am going to have to make a decision about whether she can tolerate      anticoagulation.  She has an anemia history with many polyps      recently and GI bleeding.  I will discuss this with Dr. Lysbeth [name] and      review her chart.  For now, she will continue on the medications as      listed.  2. Coronary disease:  She is having no symptoms related to this.  No      further cardiovascular testing is suggested.  3. Followup:  I will probably see her back in one month, given this      new-onset atrial fibrillation.     Sierra Rotunda, MD, [facility]  Electronically Signed    JH/MedQ  DD: 04/30/2007  DT: 05/01/2007  Job #: [id number]

## 2010-09-26 NOTE — Discharge Summary (Signed)
[name], [name] NO.:  [id number]   MEDICAL RECORD NO.:  [id number]          PATIENT TYPE:  INP   LOCATION:  2031                         FACILITY:  [facility]   PHYSICIAN:  [name]. Edilia Bo, M.D.DATE OF BIRTH:  Nov 21, 1935   DATE OF ADMISSION:  01/28/2008  DATE OF DISCHARGE:  01/31/2008                               DISCHARGE SUMMARY   DISCHARGE DIAGNOSES:  1. Nonhealing wound of left leg with multilevel arterial occlusive      disease.  2. Adult-onset diabetes, noninsulin dependent,  3. Hypertension.  4. Dyslipidemia.  5. Coronary artery disease.   PROCEDURES PERFORMED:  January 28, 2008:  1. Right-to-left fem-fem bypass with 8-mm Dacron graft.  2. Left femoral to above-knee popliteal arterial bypass graft with      nonreverse translocated saphenous vein.  3. Intraoperative arteriogram by Dr. Edilia Bo.   COMPLICATIONS:  None.   CONDITION AT DISCHARGE:  Stable, improving.   DISCHARGE MEDICATIONS:  1. Glimepiride 4 mg p.o. b.i.d.  2. Januvia 100 mg p.o. daily.  3. Actos 45 mg p.o. daily.  4. Amlodipine 5 mg p.o. daily.  5. Metoprolol 50 mg p.o. b.i.d.  6. Lasix 40 mg p.o. daily.  7. Crestor 20 mg p.o. daily.   She is given a prescription for Tylox 1 p.o. q.4 h. p.r.n. pain, total  number of 40 were given.   DISPOSITION:  She is being discharged home in stable condition with her  wounds healing well.  She is given careful instructions regarding the  care of her wounds.  She is instructed further on her activities.  She  is to see Dr. Edilia Bo in 2 weeks with ABIs.  The office will arrange  visit.  Brief identifying statement with complete details, please refer  to the typed history and physical.  Briefly, this very pleasant 75-year-  old woman was referred to Dr. Edilia Bo for nonhealing ulcers on her left  leg.  She was found to have multilevel arterial occlusive disease.  Dr.  Edilia Bo recommended right-to-left femoral-femoral bypass and left  femoral to above-knee popliteal bypass.  She was informed of the risks  and benefits of the procedure and after careful consideration, elected  to proceed with surgery.   HOSPITAL COURSE:  Preoperative workup was completed as an outpatient.  She was brought in through same-day surgery and underwent the  aforementioned revascularization procedures.  For complete details,  please refer to the typed operative report.  The procedure was without  complications.  She was returned to the post anesthesia care unit,  extubated.  Following stabilization, she was transferred to a bed on a  surgical step-down unit.  She was observed overnight and was able to be  transferred to a bed on a surgical convalescent floor.   Postoperatively, her diet and activity level were advanced.  She was  walking and improving with physical therapy.  Her wounds were healing  well.  She was desirous of discharge on January 31, 2008, and was  discharged home in stable condition.      Wilmon Arms, PA  [name]. Edilia Bo, M.D.  Electronically Signed    KEL/MEDQ  D:  01/31/2008  T:  01/31/2008  Job:  [id number]

## 2010-09-26 NOTE — Op Note (Signed)
NAMEAIMAR, [name] NO.:  [id number]   MEDICAL RECORD NO.:  [id number]          PATIENT TYPE:  INP   LOCATION:  2033                         FACILITY:  [facility]   PHYSICIAN:  [name]. Edilia Bo, M.D.DATE OF BIRTH:  [birth year]   DATE OF PROCEDURE:  06/17/2008  DATE OF DISCHARGE:                               OPERATIVE REPORT   PREOPERATIVE DIAGNOSIS:  Lymphocele in the right groin.   POSTOPERATIVE DIAGNOSIS:  Lymphocele in the right groin.   PROCEDURE:  Exploration of right groin with evacuation of lymphocele and  placement of VAC.   SURGEON:  [name]. Edilia Bo, MD   ASSISTANT:  Jerold Coombe, PA   ANESTHESIA:  General.   INDICATIONS:  This is a 75 year old woman who had presented with a  nonhealing wound on her left heel.  She had multilevel arterial  occlusive disease and ultimately underwent a right-to-left fem-fem  bypass with an 8-mm Dacron graft and then a left femoral to above-knee  pop bypass for the vein graft taken off the hood of the fem-fem graft on  the left.  She presented with swelling in the right groin and was felt  this most likely represented the lymphocele, although possibility of  infection was also entertained.  She was brought to the operating room  for exploration.   TECHNIQUE:  The patient was taken to the operating room and in  anticipation of possibly having to remove the graft and perform a left  axillofemoral bypass graft to maintain flow into the fem-pop bypass  graft on the left, the left chest, abdomen, both groins, and the entire  left lower extremity were prepped and draped in the usual sterile  fashion.  The incision in the right groin was then opened and there was  clearly a large lymphocele present here with coagulum and clear fluid.  There was no evidence of infection, Gram-stain, and culture was sent.  I  evacuated all of the lymphocele and irrigated with copious amounts of  saline.  There did not appear  to be any seepage of fluid through the  Dacron graft.  There was no obvious evidence of lymphatic drainage.  Hemostasis was obtained in the wound.  Then I closed the tissue over the  graft with a running 2-0 Vicryl suture.  I then closed both ends of the  incision with interrupted 3-0 Vicryl and with interrupted 3-0 nylons  leaving the central part of the wound open where I placed a  layer of Mepitel and then a VAC sponge was placed and this was connected  to suction at 125-cm suction.  A sterile dressing was applied.  The  patient tolerated the procedure well and was transferred to the recovery  room in satisfactory condition.  All needle and sponge counts were  correct.      [name]. Edilia Bo, M.D.  Electronically Signed     CSD/MEDQ  D:  06/17/2008  T:  06/17/2008  Job:  [id number]   cc:   Delaney Meigs, M.D.  Rollene Rotunda, MD, [facility]  Denny Peon. Ulice Brilliant, D.P.M.

## 2010-09-26 NOTE — Procedures (Signed)
BYPASS GRAFT EVALUATION   INDICATION:  Follow up left lower extremity revascularization.  Right  groin lump.   HISTORY:  Diabetes:  Yes.  Cardiac:  Yes.  Hypertension:  Yes.  Smoking:  Quit.  Previous Surgery:  Status post (right-to-left) fem-fem bypass graft and  left femoropopliteal artery bypass graft on 01/28/08 by Dr. Edilia Bo.   SINGLE LEVEL ARTERIAL EXAM                               RIGHT              LEFT  Brachial:                    186                177  Anterior tibial:             113                132  Posterior tibial:            104                133  Peroneal:  Ankle/brachial index:        0.61               0.72   PREVIOUS ABI:  Date: 02/10/08  RIGHT:  0.47  LEFT:  0.65   LOWER EXTREMITY BYPASS GRAFT DUPLEX EXAM:   DUPLEX:  1. Patent left femoropopliteal artery bypass graft.  2. Patent right-to-left femoral-femoral bypass grafts where imaged.  3. Hematoma-like structure surrounding the proximal femoral-femoral      bypass graft measuring 7.14 cm X 11.34 cm.   IMPRESSION:  1. Bilateral ankle brachial indices appear fairly stable.  2. Patent left femoropopliteal artery bypass graft.  3. Patent femoral-femoral bypass graft where imaged.  4. Nonvascularized hematoma-like structure in right groin, measuring      7.14 cm X 11.34 cm and left mid thigh measuring 0.72 cm X 6.59 cm.   ___________________________________________  [name]. Edilia Bo, M.D.   AS/MEDQ  D:  06/02/2008  T:  06/02/2008  Job:  [id number]

## 2010-09-26 NOTE — Procedures (Signed)
VASCULAR LAB EXAM   INDICATION:  Preop evaluation.   HISTORY:  Diabetes:  No.  Cardiac:  No.  Hypertension:  Yes.   EXAM:  Left lower extremity vein mapping.   IMPRESSION:  1. The left greater saphenous vein is patent and compressible with      diameter measurements ranging from 0.12-0.48 cm ankle to groin.  2. The left lesser saphenous vein is patent and compressible with      diameter measurements ranging from 0.29-0.48 cm ankle to knee.  3. See attached worksheet for additional measurements.   ___________________________________________  [name]. Edilia Bo, M.D.   CH/MEDQ  D:  01/13/2008  T:  01/14/2008  Job:  [id number]

## 2010-09-26 NOTE — Assessment & Plan Note (Signed)
OFFICE VISIT   [name], [name]  DOB:  06/13/1935                                       07/13/2008  CHART#:[id number]   I saw the patient in the office today for followup after evacuation of a  lymphocele on her right groin.  She had undergone a fem-fem bypass graft  in September of last year and later presented with a lymphocele in the  right groin.  Ultimately she had placement of a rectus femoris flap to  close the wound by Dr. Noelle Penner on February 8.  She comes in for a postop  visit.  Overall she has been doing quite well and has no lower extremity  claudication or rest pain.   PHYSICAL EXAMINATION:  On examination the groin incision is healing  nicely and she has a palpable fem-fem graft pulse.  She has two large  drains in the thigh and is scheduled to see Dr. Noelle Penner later today and  potentially have these removed.   Overall I am pleased with her progress and I will see her back in 6  weeks.  She knows to call sooner if she has problems.   [name]. Edilia Bo, M.D.  Electronically Signed   CSD/MEDQ  D:  07/13/2008  T:  07/14/2008  Job:  [id number]

## 2010-09-26 NOTE — Procedures (Signed)
BYPASS GRAFT EVALUATION   INDICATION:  Lower extremity bypass grafts.   HISTORY:  Diabetes:  Yes.  Cardiac:  Yes.  Hypertension:  Yes.  Smoking:  Previous.  Previous Surgery:  Right-to-left fem-fem and left fem-pop bypass grafts  on 01/28/2008.   SINGLE LEVEL ARTERIAL EXAM                               RIGHT              LEFT  Brachial:                    172                168  Anterior tibial:             128                150  Posterior tibial:            87                 123  Peroneal:  Ankle/brachial index:        0.74               0.87   PREVIOUS ABI:  Date: 06/02/2008  RIGHT:  0.61  LEFT:  0.72   LOWER EXTREMITY BYPASS GRAFT DUPLEX EXAM:   DUPLEX:  1. Monophasic to biphasic Doppler waveforms noted throughout the lower      extremity bypass grafts and their native vessels with an elevated      velocity of 225 cm/s noted at the proximal anastomosis region of      the right-to-left fem-fem bypass graft.  2. Trace amounts of interstitial fluid are noted at the right groin      and left proximal to distal thigh levels.   IMPRESSION:  1. Patent right-to-left fem-fem and left fem-pop bypass grafts with an      increased velocity noted, as described above.  2. Stable bilateral ankle brachial indices.   ___________________________________________  [name]. Edilia Bo, M.D.   CH/MEDQ  D:  08/31/2008  T:  08/31/2008  Job:  [id number]

## 2010-09-26 NOTE — Discharge Summary (Signed)
[name], [name] NO.:  [id number]   MEDICAL RECORD NO.:  [id number]          PATIENT TYPE:  INP   LOCATION:  2033                         FACILITY:  [facility]   PHYSICIAN:  [name]. Edilia Bo, M.D.DATE OF BIRTH:  [birth year]   DATE OF ADMISSION:  06/17/2008  DATE OF DISCHARGE:  06/22/2008                               DISCHARGE SUMMARY   DISCHARGE DIAGNOSES:  1. Draining lymphocele of right groin.  2. Coronary artery disease.  3. Peripheral vascular disease, status post femoral-femoral bypass      grafting.  4. Diabetes.  5. Hypertension.   PROCEDURES PERFORMED:  1. Exploration of right groin with evacuation of lymphocele and      placement of wound VAC that was done on June 17, 2008, by Dr.      Edilia Bo.  2. Washout right groin with rectus femoris muscle flap closure by Dr.      Noelle Penner on June 21, 2008.   COMPLICATIONS:  None.   CONDITION ON DISCHARGE:  Stable and improving.   DISCHARGE MEDICATIONS:  She is instructed to resume the following  medications:  1. Glimepiride 4 mg p.o. b.i.d.  2. Januvia 100 mg p.o. daily.  3. Actos 45 mg p.o. daily.  4. Lasix 40 mg p.o. daily.  5. Crestor 20 mg p.o. daily.  6. She is given prescription for Percocet 5/325 one p.o. q.4 h. p.r.n.      pain.  7. Keflex 500 mg p.o. t.i.d. for 2 weeks.  8. Norvasc 10 mg p.o. daily which is a new dose for her.  9. Lopressor 25 mg p.o. b.i.d. which is a new dose for her.   DISPOSITION:  She is being discharged home in stable condition with her  wounds healing well.  She is given an appointment to see Dr. Edilia Bo in  2 weeks, Dr. Antoine Poche on July 07, 2008, 9:30 a.m., and she will be  seeing Dr. Noelle Penner at his discretion.  She is discharged home with 2  Jackson-Pratt drains in place after receiving instructions from the  nurses regarding the care of her drains.  These will be managed by Dr.  Noelle Penner.   BRIEF IDENTIFYING STATEMENT:  For complete details, please  refer the  typed history and physical.  Briefly, this very pleasant 75 year old  woman underwent a right to left femoral-femoral bypass graft with 8-mm  Dacron.  She was complaining of a mass in her right groin which  developed several weeks following her surgery.  This was evaluated by  Dr. Edilia Bo and found to be a lymphocele.  He recommended drainage.  She  was informed of the risks and benefits of the procedure, and after  careful consideration, elected to proceed with surgery.   HOSPITAL COURSE:  Preoperative workup was completed as an outpatient.  She was brought in through same-day surgery and underwent the  aforementioned evacuation of her lymphocele with placement of a wound  VAC.  For complete details, please refer the typed operative report.  The procedure was without complications.  She was returned to the  postanesthesia care unit, extubated.  Following  stabilization, she was  transferred to a bed on a surgical convalescent floor.  She remained in  the hospital with wound VAC in place.  On June 20, 2008, it was  apparent that the wound VAC was not functioning appropriately.  We asked  Dr. Noelle Penner to evaluate the wound and he recommended rotation of a rectus  femoris muscle flap.  She was informed of the risks and benefits of the  procedure, and after careful consideration, elected to proceed with  surgery.  On June 21, 2008, she was returned to the operating room  for the muscle flap rotation.  The procedure was without complications.  For complete details, please refer the typed operative report.  She was  able to be returned to a bed on a surgical convalescent floor.  Postoperatively, she displayed steady improvement.  She was walking.  Dr. Noelle Penner was satisfied with this procedure and felt that it would be  okay for her discharge.  She was taught about JP drainage and care of  the Jackson-Pratt drains.   During her hospitalization, she was evaluated by Dr. Antoine Poche  and  changes were made in her cardiac medications.  She remained stable  throughout the entire course of her hospitalization and was able to be  discharged home in stable condition on June 22, 2008.      Wilmon Arms, PA      [name]. Edilia Bo, M.D.  Electronically Signed    KEL/MEDQ  D:  06/22/2008  T:  06/23/2008  Job:  [id number]   cc:   [name]. Edilia Bo, M.D.  Rollene Rotunda, MD, [facility]  Loreta Ave, MD

## 2010-09-26 NOTE — Procedures (Signed)
BYPASS GRAFT EVALUATION   INDICATION:  Follow up lower extremity bypass graft.   HISTORY:  Diabetes:  Yes.  Cardiac:  Yes.  Hypertension:  Yes.  Smoking:  Previous.  Previous Surgery:  Right-to-left fem-fem and left fem-pop bypass grafts  on 01/28/2008.   SINGLE LEVEL ARTERIAL EXAM                               RIGHT              LEFT  Brachial:                    182                178  Anterior tibial:             140                175  Posterior tibial:            86                 133  Peroneal:  Ankle/brachial index:        0.77               0.96   PREVIOUS ABI:  Date: 03/03/2009  RIGHT:  0.78  LEFT:  0.82   LOWER EXTREMITY BYPASS GRAFT DUPLEX EXAM:   DUPLEX:  Monophasic Doppler waveforms noted throughout the fem-fem  bypass graft with an increased velocity of 241 cm/s noted at the  proximal anastomosis level.  Biphasic Doppler waveforms noted throughout  the left lower extremity bypass graft in its native vessels with no  increase in velocities.   IMPRESSION:  1. Patent right-to-left femoral-femoral and left femoral-to-popliteal      bypass grafts with increased velocity noted, as described above.  2. Significant increase of the left ankle brachial index noted when      compared to the previous examination with the right ankle brachial      index remaining stable.   ___________________________________________  [name]. Edilia Bo, M.D.   CH/MEDQ  D:  08/18/2009  T:  08/18/2009  Job:  [id number]

## 2010-09-29 NOTE — Cardiovascular Report (Signed)
[place]. [facility]  Patient:    [name]                          MRN: [id number] Proc. Date: 04/05/99 Adm. Date:  [id number] Attending:  Alric Quan CC:         Gweneth Dimitri, M.D.             Rollene Rotunda, M.D. [facility]             Cardiac Catheterization Laboratory                        Cardiac Catheterization  PROCEDURE:  Right and left heart catheterization, coronary angiography, left ventriculography, abdominal aortography.  INDICATIONS:  [name] is a 75 year old woman who presented with new onset congestive heart failure.  She was found to have severe mitral regurgitation on  examination by echocardiogram.  She was referred for right and left heart catheterization.  DESCRIPTION OF PROCEDURE:  A 8 French sheath was placed in the right femoral vein, 6 French sheath in the right femoral artery.  A standard Swan-Ganz catheter was  utilized for the right heart catheterization and standard 6 French Judkins catheters were utilized for the left heart catheterization.   Contrast was Omnipaque.  There were no complications.  RESULTS:  HEMODYNAMICS:  Mean right atrial pressure is 16,  right ventricular pressure 48/18, pulmonary artery 48/28.  Pulmonary capillary wedge:  V wave is 31, mean 25. Left ventricular pressure 126/30.  Aortic pressure 128/78.  Cardiac output by the thermodilution is 2.3 with a cardiac index 1.4.  By the [facility] method cardiac output is 2.1 with a cardiac index of 1.3.  Arterial venous oxygen content difference of 7.6.  LEFT VENTRICULOGRAM:  Left ventriculogram revealed severe akinesis of the inferior wall as well as an inferoapical wall.  Ejection fraction is calculated at 38%.  There is 4+ severe mitral regurgitation.  Abdominal aortogram revealed patent renal arteries.  There is moderate atherosclerosis of the distal abdominal aorta and mild atherosclerosis of the iliac arteries bilaterally.  CORONARY  ARTERIOGRAPHY:  Right dominant system.  Left main:  Left main has an ostial 50% and a distal 60% stenosis.  Left anterior descending:  The LAD has a proximal 75% followed by a mid 70%, followed by a second 70% stenosis further down in the mid vessel.  There is a small first diagonal, a normal sized branching second diagonal which has a 70% at its  origin and a small third diagonal.  Left circumflex:  The left circumflex appears to be a very small vessel and is 00% occluded at its origin and fills by left to left collaterals.  Right coronary artery:  The right coronary artery is a very large super dominant vessel giving rise to a large posterior descending artery, a small first posterolateral branch, a large posterolateral branch, and a small third posterolateral branch.  The right coronary artery is 100% occluded proximally. The distal vessel fills via left to right collaterals.  IMPRESSION: 1. Elevated right and left heart filling pressures with moderate pulmonary    hypertension. 2. Moderately decreased left ventricular systolic function with 4+ severe    mitral regurgitation which appears to be ischemic in etiology. 3. Severe three-vessel coronary artery disease as described.  PLAN:  Cardiovascular Surgery will be consulted for consideration for high risk  coronary artery bypass grafting and a mitral valve repair or replacement.  DD:  04/05/99 TD:  04/07/99 Job: [id number]

## 2010-09-29 NOTE — Op Note (Signed)
[facility]  Patient:    [name], [name] Visit Number: [id number] MRN: [id number]          Service Type: DSU Location: DAY Attending Physician:  Dessa Phi Dictated by:   Elpidio Anis, M.D. Proc. Date: 10/21/01 Admit Date:  10/21/2001   CC:         Dr. Uvaldo Rising, [facility]   Operative Report  PREOPERATIVE DIAGNOSIS:  Recurrent severe vascular headaches.  POSTOPERATIVE DIAGNOSIS:  Recurrent severe vascular headaches.  OPERATION/PROCEDURE:  Right temporal artery biopsy.  SURGEON:  Elpidio Anis, M.D.  DESCRIPTION:  The patient was taken to the operating suite.  She was given deep IV sedation and monitored closely by anesthesia.  Her right temporal area was prepped and draped in a sterile field.  The pulse of the temporal artery was identified.  It was marked with a marking pen.  A longitudinal incision was made over the pulse.  The incision was extended through the subcutaneous tissue to the muscle.  Lying along the muscle the artery was identified.  It was dissected, clipped, and removed.  A 1.5 segment was removed and sent off for pathologic evaluation.  The subcutaneous tissue was closed with 4-0 Dexon. The skin was closed with 4-0 Dexon.  A dressing was placed.  She was transferred to a bed and taken to the day surgery area for short-term monitoring. Dictated by:   Elpidio Anis, M.D. Attending Physician:  Dessa Phi DD:  10/21/01 TD:  10/22/01 Job: [id number]

## 2010-09-29 NOTE — Discharge Summary (Signed)
[place]. [facility]  Patient:    [name]                          MRN: [id number] Adm. Date:  [id number] Disc. Date: 04/20/99 Attending:  Charlett Lango Dictator:   Eugenia Pancoast, P.A.                           Discharge Summary  FINAL DIAGNOSES: 1. Coronary artery disease. 2. Postoperative atrial fibrillation. 3. History of postoperative second degree heart block. 4. Hyperlipidemia. 5. Hypertension. 6. Diabetes mellitus type 2.  PROCEDURES:  April 05, 1999 - left heart catheterization per Dr. Loraine Leriche Pulsipher. April 14, 1999 - coronary artery bypass x 4 with left internal mammary artery to the left anterior descending, saphenous vein graft to the posterior descending artery, saphenous vein graft to the posterolateral artery, and saphenous vein graft to the diagonal artery.  Surgeon - Dr. Dorris Fetch.  HISTORY:  This is a 75 year old female who recently lost her husband.  Patient as history of elevated cholesterol, diabetes and hypertension.  She denies any history of any heart problems.  She was seen in the [place] office on the day of admission for evaluation of shortness of breath and palpitations.  She had been recently started on Zestril.  In the past she had been on zestoretic.  The night  prior o admission she had shortness of breath all night which improved by sitting up. Because of this she was subsequently referred to cardiology for evaluation and as admitted to [facility].  HOSPITAL COURSE:  Patient admitted to [facility] at Dr. Jackey Loge service and was evaluated, subsequently underwent left heart catheterization on April 05, 1999.  The left heart catheterization was significant for three-vessel disease. Because of this disease and patients symptoms, she was referred to Dr. Dorris Fetch for surgical revascularization.  Dr. Dorris Fetch spoke with patient and discussed surgery, risks and  benefits were explained, patient understood and agreed to surgery.  There was a question of mitral regurgitation.  Patient underwent TEE n April 13, 1999.  This showed normal aorta, mitral valve was mildly thickened. There was moderate to severe mitral regurgitation.  Patient subsequently underwent coronary revascularization on April 14, 1999, by Dr. Dorris Fetch.  At this time she had a coronary artery bypass x 4 with LIMA to LAD, saphenous vein graft to he posterior descending artery, saphenous vein graft to the posterolateral artery, and saphenous vein graft to the diagonal artery.  She also had repair of (DICTATION  CUTOFF.) DD:  04/19/99 TD:  04/20/99 Job: [id number]

## 2010-09-29 NOTE — Assessment & Plan Note (Signed)
[facility]                            CARDIOLOGY OFFICE NOTE   [name], [name]                       MRN:          [id number]  DATE:07/31/2006                            DOB:          10-18-1935    PRIMARY CARE PHYSICIAN:  Delaney Meigs, M.D.   REASON FOR PRESENTATION:  Evaluate patient with recent hospitalization  for junctional bradycardia, hyperkalemia and renal failure.   HISTORY OF PRESENT ILLNESS:  The patient was admitted on February 17,  after flu-like illness with decreased p.o. intake. She continued to take  medications and subsequently presented to [facility] with a slow heart  rate. She was noted to be in junctional bradycardia with rates in the  30s. She was hyperkalemic with a potassium at 7 and a creatinine of 2.7.  This was managed medically. She did not have myocardial infarction and  her rhythm eventually returned to sinus rhythm. She was also noted to be  anemic. Gastroenterology saw her and is following as an outpatient.   She says she is finally getting her strength back. She has had no  further palpitations or strong heart beats. She has had no pre-syncope  or syncope. She denies any red blood either from above or below. She has  had no black tarry stools. She denies any chest pressure, neck  discomfort, arm discomfort, activity-induced nausea, vomiting, or  excessive diaphoresis. She has had no PND or orthopnea.   PAST MEDICAL HISTORY:  1. Coronary artery disease status post coronary artery bypass graft      (Left internal mammary artery (LIMA) to the left anterior      descending artery (LAD); saphenous vein graft (SVG) to posterior      descending artery (PDA); saphenous vein graft (SVG) to      posterolateral; and saphenous vein graft (SVG) to diagonal).  2. #27 Saint Jude's annuloplasty ring.  3. Ischemic cardiomyopathy.  4. Initially mildly reduced global ejection fraction of 50% during her  hospitalization. She did have mild mitral regurgitation.  5. Mild to moderate pulmonary hypertension by echocardiography.  6. Dental extraction prior to surgery.  7. Hypertension.  8. Diabetes mellitus.  9. Acute renal insufficiency as described above.  10.A junctional rhythm related to hyperkalemia as described above.  11.Cholecystectomy.  12.Status post partial amputation of the right thumb.   ALLERGIES:  None.   MEDICATIONS:  1. Klor-Con 20 mEq daily.  2. Glimepiride 4 mg b.i.d.  3. Furosemide 40 mg daily.  4. Sular 10 mg daily.  5. Altace 10 mg daily.  6. Vytorin 10/20 daily.  7. Co-enzyme Q.  8. Fish oil.  9. Iron.  10.Toprol 100 mg daily.  11.Actos 45 mg daily.   REVIEW OF SYSTEMS:  As stated in her HPI and otherwise negative for  other systems.   PHYSICAL EXAMINATION:  The patient is in no distress. Weight is 158  pounds. Blood pressure is 168/82, heart rate is 84 and regular.  HEENT: Eyelids unremarkable. Pupils equal, round, and reactive to light.  Fundi not visualized. Oral mucosa is unremarkable.  NECK: No jugular  venous distention at 45 degrees.  Carotid upstroke  brisk and symmetrical. No bruits, no thyromegaly.  LYMPHATICS: No lymphadenopathy.  LUNGS: Clear to auscultation bilaterally.  BACK: No costovertebral angle tenderness.  CHEST: Well-healed sternotomy scar.  HEART: PMI not displaced or sustained. S1, S2 within normal limits. No  S3. No S4. A 2/6 apical systolic murmur, early peak and radiating out  the aortic outflow tract. No diastolic murmurs.  ABDOMEN: Flat, positive bowel sounds, normal in frequency and pitch. No  bruits. No rebounds. No guarding. No midline pulsatile mass. No  organomegaly.  SKIN: No rashes, no nodules.  EXTREMITIES: 2+ pulses. Trace bilateral lower extremity edema.  No  cyanosis or clubbing.  NEURO: Oriented to person, place and time. Cranial nerves II-XII grossly  intact. Motor grossly intact throughout.   EKG: Sinus  bradycardia with a first-degree AV block. Premature  ventricular contractions. Poor anterior R-wave progression. QT mildly  prolonged (disregard the computer measurement).   ASSESSMENT/PLAN:  1. Bradycardia. This seems to have resolved. The patient does not have      any symptoms related to this. This seemed to be related to her      hyperkalemia. At this point, no further cardiovascular testing is      suggested. She will continue the metoprolol 100 mg a day.  2. Acute renal insufficiency. This seems to have resolved as well. She      is going to get a BMET at Dr. Joyce Copa office early next week.  3. Anemia. She was anemic when she left the hospital. She is seeing      Dr. Jarold Motto tomorrow. She was also given instructions to get a      CBC if she does not get one through Dr. Jarold Motto. She will have      this done by Dr. Lysbeth Galas.  4. Hypertension. Blood pressure is elevated. I will leave her on the      current medications, but encouraged her to get a blood pressure      diary. She is going to keep it three times a week and let me know      how it is trending. I will make further recommendations based on      this.  5. Risk reductions. She is following Dr. Lysbeth Galas for management of her      diabetes and      dyslipidemia.  6. Followup. Given her recent problems, I would like to see her at      least in three months.     Rollene Rotunda, MD, [facility]  Electronically Signed    JH/MedQ  DD: 07/31/2006  DT: 07/31/2006  Job #: [id number]   cc:   Delaney Meigs, M.D.

## 2010-09-29 NOTE — Discharge Summary (Signed)
[place]  Patient:    [name]                          MRN: [id number] Adm. Date:  [id number] Disc. Date: 04/14/99 Attending:  Charlett Lango Dictator:   Eugenia Pancoast, P.A.                           Discharge Summary  DATE OF BIRTH:  December 25, 1935  FINAL DIAGNOSES: 1. Coronary artery disease. 2. Postoperative atrial fibrillation. 3. Postoperative second-degree heart block. 4. Hyperlipidemia. 5. Hypertension. 6. History of diabetes mellitus type 2.  PROCEDURES: 1. April 05, 1999, left heart catheterization per Dr. Loraine Leriche Pulsipher. 2. April 14, 1999, coronary artery bypass x 4 with LIMA to the LAD, saphenous    vein graft to posterior descending artery, saphenous vein graft to    posterolateral artery, and saphenous vein graft to the diagonal artery.  SURGEON:  Dr. Dorris Fetch.  HISTORY OF PRESENT ILLNESS:  This is a 75 year old female who recently lost her  husband.  She had a history of elevated cholesterol, diabetes, and hypertension. The patient denied any history of any heart problems, except as a child, which he outgrew.  The patient is normally followed by Dr. Corliss Blacker in [place]. DD:  04/19/99 TD:  04/20/99 Job: [id number]

## 2010-09-29 NOTE — Procedures (Signed)
[place]. [facility]  Patient:    [name]                          MRN: [id number] Proc. Date: 04/13/99 Adm. Date:  [id number] Attending:  Charlett Lango                           Procedure Report  PROCEDURE:  This is a transesophageal echocardiogram to evaluate the patients mitral valve and severity of mitral regurgitation prior to coronary artery bypass graft and mitral valve replacement.  PROCEDURAL NOTE:  The patient was sedated with Demerol 25 mg and Versed 2 mg IV x 1.  The pharynx was anesthetized with topical Hurricaine spray.  The OmniPlane probe was passed without difficulty.  There was inferior posterior and apical akinesis with overall moderately reduced left ventricular function.  The estimated ejection fraction was 35-40%.  The patient did have mild left ventricular enlargement.  The right side appeared to be normal.  The aortic valve was normal, and there was no significant aortic insufficiency.  The mitral valve was mildly thickened.  There was very mild prolapse of the anterior leaflet noted in the four-chamber view.  By color, there was moderate o severe mitral regurgitation.  It should be noted there was no flow reversal noted in the pulmonary veins.  It was felt that the mitral regurgitation was most likely secondary to accommodation of mitral valve prolapse and ______  dilatation. There was mild tricuspid regurgitation.  By color, there was a patent foramina valley  noted.  INTERPRETATION: 1. Inferior posterior and apical akinesis with overall moderate reduction in left    ventricular function. 2. Mild left ventricular enlargement. 3. Mild prolapse of the anterior mitral valve leaflet. 4. Moderate to severe mitral regurgitation, most likely due to a combination of  mild mitral valve prolapse and ______  dilatation. 5. Mild tricuspid regurgitation. 6. Patent foramina valley by color. DD:  04/13/99 TD:   04/16/99 Job: [id number]

## 2010-09-29 NOTE — H&P (Signed)
[name], [name]                ACCOUNT NO.:  [id number]   MEDICAL RECORD NO.:  [id number]          PATIENT TYPE:  INP   LOCATION:  3731                         FACILITY:  [facility]   PHYSICIAN:  Reginia Forts, MD     DATE OF BIRTH:  [birth year]   DATE OF ADMISSION:  06/30/2006  DATE OF DISCHARGE:                              HISTORY & PHYSICAL   PRIMARY CARE PHYSICIAN:  Dr. Lysbeth Galas.   CARDIOLOGIST:  Dr. Antoine Poche.   CHIEF COMPLAINT:  Bradycardia and hyperkalemia transfer.   HISTORY OF PRESENT ILLNESS:  Ms. [name] is a 75 year old Caucasian woman  with a history of coronary artery disease status post CABG and mitral  valve repair, history of chronic renal insufficiency who presents from  [facility] with bradycardia and hyperkalemia and renal  failure.  Patient has no prior history of bradycardia.  She was followed  by nephrology until 2 years ago and states that her kidneys are  currently stable.  2 weeks ago, she developed an upper respiratory  illness with flu-like symptoms.  During the past week she has been weak  with anorexia, diaphoresis, and mild chills.  She continued her  potassium and Lasix during that time.  Over the past 2 days, she has  become weaker and lightheaded upon standing.  She noted slow forceful  heartbeats and subsequently presented to [facility] Emergency Room.  In  the ER, she was noted to be in junctional bradycardia.  Her potassium  was 7.0 and her creatinine was 2.7.  She received calcium bicarbonate  and Kayexalate with mild improvement in her heart rate from 30 to  approximately 45.  She was subsequently transferred to [facility] for  further management.  She denies any orthopnea or paroxysmal nocturnal  dyspnea or chest pain.  She denies any syncope.   PAST MEDICAL HISTORY:  1. Coronary artery disease with severe ischemic mitral regurgitation      status post four-vessel CABG and mitral valve repair with      annuloplasty ring in 2000.   EF historically 35 to 40% by TEE in      2000.  2. Diabetes.  3. Hypertension.  4. Renal insufficiency.  5. Hyperlipidemia.  6. Cholecystectomy.  7. Partial amputation of right thumb.   ALLERGIES:  CODEINE.   MEDICATIONS:  1. Sular 40 mg p.o. daily.  2. Potassium chloride 20 mEq daily.  3. Actos 45 mg p.o. daily.  4. Altace 10 mg p.o. daily.  5. Metformin 750 mg p.o. in a.m., 500 mg p.o. p.m.  6. Glimepiride 4 mg p.o. daily.  7. Metoprolol 200 mg p.o. daily.  8. Lasix 40 mg p.o. daily.  9. Vytorin 10/20 mg p.o. daily.  10.Antibiotic, which is unknown, which was started approximately 2      days ago.   SOCIAL HISTORY:  Patient lives in [place].  She is disabled.  She has  a son and daughter-in-law who live nearby.  Used to smoke 1/2 pack a day  for 25 years.  Quit in 2000.  Denies any alcohol or drug use.  REVIEW OF SYSTEMS:  Notable for rhinitis, diaphoresis, occasional chills  and fevers, myalgias.  The rest of the 12 review of systems was reviewed  and is negative.   PHYSICAL EXAM:  Temperature 98.5, pulse 53, blood pressure 124/70.  Respiratory rate is 18.  In general, patient is awake, alert, oriented x3, in no acute distress.  HEENT is normocephalic, atraumatic.  Pupils equal, round, and reactive  to light.  Extraocular movements are intact.  Neck shows no JVD.  Cardiovascular is regular rhythm, bradycardic with 1/6 holosystolic  murmur at apex.  Lungs demonstrate positive upper airway sounds.  Otherwise, clear to  auscultation bilaterally.  Abdomen has positive bowel sounds.  Soft, nontender, nondistended.  Extremities show no cyanosis, clubbing, or edema.  Psych demonstrates normal affect.  Partial right thumb amputation.  Musculoskeletal demonstrates no significant tenderness.  NEURO:  Cranial nerves II-XII grossly intact.  No focal musculoskeletal  or sensory deficits.  Chest x-ray demonstrates mild increased interstitial prominence.  EKG  demonstrates  structural bradycardia.   Labs demonstrate troponin less than 0.05 x2 sets with a BNP of 643, BUN  of 37, creatinine 2.7, potassium of 6.8.  Current potassium is pending.  White count of 10.5.  Hematocrit of 27.6, and platelet count of 273,000.   ASSESSMENT AND PLAN:  This is a 75 year old Caucasian woman with  coronary artery disease status post CABG and mitral valve repair who  presents with bradycardia secondary to hyperkalemia, dehydration, and  renal failure.  1. Hyperkalemia.  IV hydration has been initiated.  Kayexalate will be      given.  At this time, there is no acute need for dialysis.  Zoll      have been placed in the patient.  Patient has received calcium      bicarb and 2 doses of Kayexalate.  2. Bradycardia.  This appears to be improving with hyperkalemia      treatment.  Beta blocker is being held.  3. Acute renal failure.  IV hydration is being administered.  Diabetes      and cardiac meds are being held due to the currently poor renal      clearance.  If renal function does not improve by morning,      nephrology will be consulted.  4. Upper respiratory infection.  Z-Pak will be initiated.      Reginia Forts, MD  Electronically Signed     RA/MEDQ  D:  06/30/2006  T:  07/01/2006  Job:  [id number]

## 2010-09-29 NOTE — Procedures (Signed)
[place]. [facility]  Patient:    [name]                          MRN: [id number] Proc. Date: 04/14/99 Adm. Date:  [id number] Attending:  Charlett Lango CC:         Guadalupe Maple, M.D.                           Procedure Report  PROCEDURE PERFORMED:  Intraoperative transesophageal echocardiography.  ANESTHESIA:  Guadalupe Maple, M.D.  INDICATIONS:  Ms. [name] is a 75 year old white female with a history of severe three-vessel coronary disease and significant mitral regurgitation who is scheduled to undergo coronary artery bypass grafting and possible mitral valve replacement or replacement by Dr. Dorris Fetch.  Intraoperate transesophageal echocardiography was requested to evaluate the mitral valve and determine its suitability for replacement or repair and also to judge the adequacy of the procedure and to serve as a monitor of intraoperative volume status.  DESCRIPTION OF PROCEDURE:  The patient was brought to the operating room at [facility].  General anesthesia was induced without difficulty. The trachea was intubated easily.  The transesophageal echocardiographic probe was inserted into the esophagus without difficulty.  IMPRESSION:  Prebypass findings: 1. Mild to moderate prolapse of the anterior leaflet of the mitral valve with    2+ mitral insufficiency with a central jet.  There was no evidence of ruptured    chordae.  The pulse wave Doppler interrogation of the left upper and right    upper pulmonary vein were normal with no blunting of systolic forward flow. The    leaflets appeared mildly thickened with no vegetations or fluttering. 2. Mild aortic sclerosis with otherwise normal-appearing aortic valve.  There was a    trileaflet valve which opened normally.  There was no evidence of aortic    insufficiency or stenosis. 3. Moderate left ventricular dysfunction with a dilated left ventricle  with global    left ventricular hypokinesis and ejection fraction estimated at 35 to 40%. 4. The left atrium appeared mildly to moderately enlarged with no evidence of    thrombus in the left atrium or left atrial appendage. 5. A small patent foramen ovale which was evident by Color Flow Doppler. 6. Moderate right ventricular dilatation with no evidence of significant right    ventricular dysfunction. 7. Normal-appearing tricuspid valve with no evidence of tricuspid insufficiency.  Postbypass findings: 1. Successful mitral valve repair using an anuloplasty ring.  There was the  characteristic trapdoor appearance of the anterior leaflet of the mitral    valve with residual trace mitral insufficiency.  There was no evidence of  rocking or instability of the annuloplasty ring. 2. Aortic valve showed mild sclerosis, otherwise normal-appearing unchanged from    prebypass findings. 3. Moderate left ventricular dysfunction with global left ventricular hypokinesis    and moderate left ventricular dilatation which was unchanged from the prebypass    finding. DD:  04/14/99 TD:  04/17/99 Job: [id number]

## 2010-09-29 NOTE — Assessment & Plan Note (Signed)
[facility]                         GASTROENTEROLOGY OFFICE NOTE   NAZARETH, NORENBERG                       MRN:          [id number]  DATE:08/01/2006                            DOB:          09-09-1935    Ms. [name] is a 75 year old white female who is referred through the  courtesy of Dr. Antoine Poche for evaluation of chest pain and iron  deficiency anemia.   Ms. [name] was hospitalized from February 17 to February 20 at [facility] with a flu-like illness, associated anorexia, diaphoresis,  with poor fluid intake.  Was found to have mild acute renal failure and  a junctional bradycardia and was admitted for rehydration.  She has  known coronary artery disease with a 4-vessel bypass in 2000.  Was on  aspirin therapy 81 mg a day at the time of her admission.  She also has  an ischemic cardiomyopathy with an ejection fraction of 35% - 40%, a  history of mitral regurgitation with mitral valve annuloplasty in 2000.  Today has hypertension, hyperlipidemia, type 2 diabetes, mild chronic  renal insufficiency, and has had previous cholecystectomy.  During her  hospitalization apparently stool was guaiac negative but she had a  hemoglobin which was decreased to 9.8 with microcytic indices.   The patient has rather typical burning substernal chest pain with acid  reflux, but denies dysphagia.  Has mild constipation but has not noticed  melena or hematochezia.  Currently is on iron therapy.  Her last  endoscopy and colonoscopy were in December of 2003.  Did have some  Barrett's mucosa in her esophagus at that time but is not on PPI therapy  at this time.  Review of her chart also shows that she had a polypectomy  performed in 2002 with a large polyp in the distal colon.  She also had  some smaller polyps removed at the time of her colonoscopy in December  2003.  She is due for followup at this time.   She denies abdominal pain, clay colored stools,  dark urine, icterus,  fever or chills.  She has no history of coagulopathy.  She denies abuse  of NSAIDs and has been off salicylates for the last month.  She denies  dysphagia.  She follows a regular diet and has had no recent weight  loss.   MEDICATIONS:  1. Potassium 20 mEq a day.  2. Vytorin 10/20 daily.  3. Metoprolol 100 mg daily.  4. Glimepiride 4 mg daily.  5. Sular 10 mg daily.  6. Iron 325 mg twice a day.  7. Ramipril 10 mg daily.  8. Metformin 500 mg daily,  9. Lasix 40 mg daily.  10.Actos 45 mg daily.   She uses p.r.n. nitroglycerin.   PAST MEDICAL HISTORY:  Otherwise remarkable for multiple surgical  procedures including:  1. Cholecystectomy.  2. Bypass surgery.  3. Breast removal.  4. Appendectomy.  5. Heart valve surgery with an annuloplasty.   Has had hypertension for over 20 years and apparently had an MI in 2000.  She has been diabetic for greater than 20 years and  has mild chronic  renal insufficiency but does not require dialysis.   FAMILY HISTORY:  Remarkable for a son, age 91, that had colon carcinoma.  Multiple family members have diabetes and atherosclerosis.   SOCIAL HISTORY:  She is widowed, she lives by herself.  She has an 8th  grade education and is retired from farming.  She use to smoke heavily  but has not smoked in 7 years and denies problems with ethanol abuse or  illicit drug use.   REVIEW OF SYSTEMS:  Negative for any symptoms except shortness of breath  with exertion.  She is not on oxygen.  Her chest pain currently does not  seem to be exercise related.  She denies palpitations.  Review of  systems otherwise noncontributory.  Dr. Lysbeth Galas manages her diabetes.  During her recent hospitalization her serum creatinine was 1.47 at the  time of discharge with an estimated GFR of 35 cc per minute.  At the  time of her admission she did have some electrolyte imbalances with a  low sodium and a high potassium.  Iron saturation at the time  of  admission was 19%.  Hemoglobin on discharge was 9.5, actually with  normochromic normocytic indices on review.  CBC otherwise was normal.  Protime was 13.7.   EXAMINATION:  She is a healthy appearing white female in no distress  appearing her stated age.  She is 5 feet 5 inches tall and weighs 158  pounds.  Blood pressure 140/62 and pulse was 68 and regular.  I could  not appreciate stigmata of chronic liver disease with thyromegaly.  CHEST:  Was not clear and showed some rales at both lung bases, but no  dullness to percussion.  She appeared to be in a regular rhythm and I  could not appreciate any definite murmurs, gallops, or rubs.  There was no hepatosplenomegaly, abdominal masses, or tenderness.  Bowel  sounds were normal.  PERIPHERAL EXTREMITIES:  Unremarkable.  RECTAL:  Deferred.  MENTAL STATUS:  Normal.   ASSESSMENT:  1. Iron deficiency anemia in a patient that has had polyps and has a      history of her son having colon cancer at a young age.  It has been      several years since she had colonoscopy and she certainly is at      risk for possible colon carcinoma.  2. Chronic acid reflux with known Barrett's mucosa in the esophagus      without proton pump inhibitor therapy.  3. Previous salicylate use, probably contributing to her iron loss.  4. Severe atherosclerosis with ischemic cardiomyopathy, history of      chronic congestive heart failure.  5. Adult onset diabetes mellitus with associated mild renal      insufficiency.  6. History of chronic obstructive lung disease from previous smoking.  7. Status post cholecystectomy and appendectomy and heart valve      surgery.  8. Status post cardiac bypass surgery in 2000.   RECOMMENDATIONS:  1. Start Nexium 40 mg a day with standard anti-reflux maneuvers.  2. Outpatient endoscopy and colonoscopy next week. 3. Adjustment in diabetic medications for procedures with close      monitoring during her exam.  4. Other  medications per Dr. Lysbeth Galas and Hochrein.     Vania Rea. Jarold Motto, MD, [facility], FAGA  Electronically Signed    DRP/MedQ  DD: 08/01/2006  DT: 08/01/2006  Job #: [id number]   cc:   Delaney Meigs, M.D.  Rollene Rotunda, MD, [facility]

## 2010-09-29 NOTE — Assessment & Plan Note (Signed)
[facility]                         GASTROENTEROLOGY OFFICE NOTE   CALIE, BUTTREY                       MRN:          [id number]  DATE:08/01/2006                            DOB:          [birth year]    CANCELED DICTATION     Vania Rea. Jarold Motto, MD, [facility], FAGA  Electronically Signed    DRP/MedQ  DD: 08/01/2006  DT: 08/01/2006  Job #: [id number]

## 2010-09-29 NOTE — Discharge Summary (Signed)
NAMECAMERIN, JIMENEZ NO.:  [id number]   MEDICAL RECORD NO.:  [id number]          PATIENT TYPE:  INP   LOCATION:  3731                         FACILITY:  [facility]   PHYSICIAN:  Rollene Rotunda, MD, FACCDATE OF BIRTH:  [birth year]   DATE OF ADMISSION:  06/30/2006  DATE OF DISCHARGE:  07/03/2006                               DISCHARGE SUMMARY   PRIMARY CARDIOLOGIST:  Rollene Rotunda, MD, [facility]   PRIMARY CARE PHYSICIAN:  Delaney Meigs, M.D.   PRIMARY GI:  Vania Rea. Jarold Motto, MD, Clementeen Graham, FACP, FAGA.   PRINCIPAL DIAGNOSIS:  Junctional bradycardia in the setting of  hyperkalemia and acute renal failure.   SECONDARY DIAGNOSES:  1. Coronary artery disease status post four-vessel CABG in 2000.  2. Ischemic cardiomyopathy, ejection fraction of 35-40%.  3. History of ischemic mitral regurgitation status post mitral valve      annuloplasty in 2000.  4. Hypertension.  5. Hyperlipidemia.  6. Type 2 diabetes mellitus.  7. Chronic renal insufficiency.  8. Cholecystectomy.  9. Status post partial amputation of the right thumb.   ALLERGIES:  CODEINE.   PROCEDURES:  None.   HISTORY OF PRESENT ILLNESS:  A 75 year old female with a prior history  of CAD and mitral valve repair as outlined above who was in he usual  state of health until approximately 1 week prior to admission when she  had flu-like symptoms including anorexia, diaphoresis and mild chills  with poor p.o. intake.  Despite her symptoms, she continues to take  Lasix and potassium, and over the 2 days prior to admission became  progressively weaker and lightheaded.  She presented to the [facility] on June 30, 2006 with the sensation of forceful slow heart beat  and was noted to be in junctional bradycardia with rates in the 30s.  Lab work revealed a potassium of 7.0 and a creatinine of 2.7.  She was  treated with calcium bicarbonate and Kayexalate with mild improvement in  the heart rate up to the mid  40s.  She was transferred to [facility] for  further evaluation.   HOSPITAL COURSE:  The patient ruled out for MI and had no additional  problems with bradycardia with correction of potassium.  As a result,  the beta blocker was reinitiated, and she has tolerated this.  She has  been noted to be anemic with a hemoglobin of 8.2 and hematocrit of 25.0,  and GI was consulted.  She has been seen by Dr. Jarold Motto in the past  with EGD in 2003 revealing a hiatal hernia and Barrett's esophagus.  She  also had a colonoscopy in 2002 revealing colon polyps.  She was seen by  Dr. Leone Payor from [facility] who did not feel that she  required inpatient colonoscopy at this time.  He did recommend  initiation of omeprazole therapy, as well as ferrous sulfate and follow-  up Dr. Jarold Motto regarding history of colon polyps and Barrett's  esophagus.  She will, therefore, be discharged home today in  satisfactory condition.   DISCHARGE LABORATORIES:  Hemoglobin 9.5, hematocrit 28.9, WBC 7.1,  platelets 276.  Sodium 139, potassium 3.8, chloride 111, CO2 of 22, BUN  25, creatinine 1.94, glucose 75, calcium 8.6, LVH 156, CK 56, MB 1.6,  troponin I of 0.02, total iron binding capacity of 288, iron 54,  ferritin 50.   DISPOSITION:  The patient is being discharged home today in good  condition.   FOLLOW-UP PLANS AND APPOINTMENTS:  1. She has a follow-up appointment with Dr. Antoine Poche on July 31, 2006      at 1:45 p.m.  2. She does follow-up with Dr. Lysbeth Galas in the next 1-2 weeks.  3. She does follow up with Dr. Jarold Motto within 2-3 weeks.   DISCHARGE MEDICATIONS:  1. Prilosec OTC 20 mg daily.  2. Ferrous sulfate 325 mg t.i.d. with meals.  3. Colace 100 mg daily.  4. Sular 10 mg daily.  5. K-Dur 20 mEq daily.  6. Lasix 40 mg daily.  7. Metformin 500 mg daily.  8. Actos 45 mg daily.  9. Amaryl 4 mg daily.  10.Lopressor 50 mg b.i.d.  11.Vytorin 10/20 mg daily.  12.Nitroglycerin 0.4 mg  sublingual p.r.n. chest pain.  (Her Altace has not been reinitiated at this time.)   OUTSTANDING LABORATORY STUDIES:  LDH, reticulocyte count and peripheral  smear.   DURATION OF DISCHARGE ENCOUNTER:  Thirty-five minutes including  physician time.      Nicolasa Ducking, ANP      Rollene Rotunda, MD, [facility]  Electronically Signed    CB/MEDQ  D:  07/03/2006  T:  07/04/2006  Job:  [id number]   cc:   Vania Rea. Jarold Motto, MD, Caleen Essex, FAGA  Delaney Meigs, M.D.

## 2010-12-21 ENCOUNTER — Encounter: Payer: Self-pay | Admitting: Cardiology

## 2011-02-12 LAB — URINE MICROSCOPIC-ADD ON

## 2011-02-12 LAB — BASIC METABOLIC PANEL
CO2: 24
Chloride: 99
Creatinine, Ser: 1.72 — ABNORMAL HIGH
GFR calc Af Amer: 35 — ABNORMAL LOW

## 2011-02-12 LAB — URINE CULTURE

## 2011-02-12 LAB — POCT I-STAT GLUCOSE: Glucose, Bld: 170 — ABNORMAL HIGH

## 2011-02-12 LAB — GLUCOSE, CAPILLARY
Glucose-Capillary: 122 — ABNORMAL HIGH
Glucose-Capillary: 147 — ABNORMAL HIGH
Glucose-Capillary: 150 — ABNORMAL HIGH
Glucose-Capillary: 156 — ABNORMAL HIGH
Glucose-Capillary: 162 — ABNORMAL HIGH
Glucose-Capillary: 167 — ABNORMAL HIGH
Glucose-Capillary: 199 — ABNORMAL HIGH
Glucose-Capillary: 45 — ABNORMAL LOW
Glucose-Capillary: 60 — ABNORMAL LOW
Glucose-Capillary: 96

## 2011-02-12 LAB — CBC
Hemoglobin: 8.1 — ABNORMAL LOW
MCHC: 33.2
MCHC: 34
MCV: 82.8
Platelets: 138 — ABNORMAL LOW
RBC: 2.89 — ABNORMAL LOW
RBC: 3.38 — ABNORMAL LOW
WBC: 11.2 — ABNORMAL HIGH

## 2011-02-12 LAB — URINALYSIS, ROUTINE W REFLEX MICROSCOPIC
Bilirubin Urine: NEGATIVE
Glucose, UA: 100 — AB
Ketones, ur: NEGATIVE
pH: 5

## 2011-02-12 LAB — POCT I-STAT 7, (LYTES, BLD GAS, ICA,H+H)
Acid-Base Excess: 4 — ABNORMAL HIGH
HCT: 28 — ABNORMAL LOW
Hemoglobin: 9.5 — ABNORMAL LOW
Potassium: 3.7
Sodium: 140
pH, Arterial: 7.473 — ABNORMAL HIGH

## 2011-02-12 LAB — POCT I-STAT 4, (NA,K, GLUC, HGB,HCT): Glucose, Bld: 137 — ABNORMAL HIGH

## 2011-02-14 LAB — URINE MICROSCOPIC-ADD ON

## 2011-02-14 LAB — TYPE AND SCREEN
ABO/RH(D): O POS
Antibody Screen: NEGATIVE

## 2011-02-14 LAB — URINALYSIS, ROUTINE W REFLEX MICROSCOPIC
Bilirubin Urine: NEGATIVE
Hgb urine dipstick: NEGATIVE
Ketones, ur: NEGATIVE
Protein, ur: 100 — AB
Urobilinogen, UA: 0.2

## 2011-02-14 LAB — COMPREHENSIVE METABOLIC PANEL
ALT: 15
AST: 15
Albumin: 3.9
Alkaline Phosphatase: 45
BUN: 27 — ABNORMAL HIGH
Chloride: 101
Potassium: 4.1
Sodium: 137
Total Bilirubin: 0.8
Total Protein: 7.1

## 2011-02-14 LAB — CBC
HCT: 35.6 — ABNORMAL LOW
Platelets: 245
WBC: 8.9

## 2011-02-14 LAB — PROTIME-INR: INR: 0.9

## 2011-04-16 ENCOUNTER — Other Ambulatory Visit: Payer: Self-pay | Admitting: Cardiology

## 2011-06-06 ENCOUNTER — Ambulatory Visit (INDEPENDENT_AMBULATORY_CARE_PROVIDER_SITE_OTHER): Payer: Medicare Other | Admitting: Cardiology

## 2011-06-06 ENCOUNTER — Encounter: Payer: Self-pay | Admitting: Cardiology

## 2011-06-06 DIAGNOSIS — I10 Essential (primary) hypertension: Secondary | ICD-10-CM

## 2011-06-06 DIAGNOSIS — I2510 Atherosclerotic heart disease of native coronary artery without angina pectoris: Secondary | ICD-10-CM

## 2011-06-06 DIAGNOSIS — I4891 Unspecified atrial fibrillation: Secondary | ICD-10-CM

## 2011-06-06 NOTE — Assessment & Plan Note (Signed)
We again had the conversation about CVA risk. She again refuses any anticoagulation. No change in therapy is indicated otherwise.

## 2011-06-06 NOTE — Assessment & Plan Note (Signed)
Her blood pressure is slightly elevated here. However, it is otherwise well controlled by her report. I will make no change to her medical regimen.

## 2011-06-06 NOTE — Progress Notes (Signed)
   HPI The patient presents for one-year followup. She has a history of coronary disease and atrial fibrillation. Since I last saw her she has had no new cardiovascular complaints. She denies any chest pressure, neck or arm discomfort. She doesn't notice any palpitations, presyncope or syncope. She has some chronic lower extremity swelling.   Allergies  Allergen Reactions  . Sulfonamide Derivatives     Current Outpatient Prescriptions  Medication Sig Dispense Refill  . furosemide (LASIX) 40 MG tablet Take 40 mg by mouth daily.       . insulin glargine (LANTUS) 100 UNIT/ML injection Inject into the skin at bedtime.        . NORVASC 10 MG tablet TAKE 1 TABLET ONCE DAILY.  30 each  3  . rosuvastatin (CRESTOR) 10 MG tablet Take 10 mg by mouth daily.        Past Medical History  Diagnosis Date  . Paroxysmal atrial fibrillation   . PVD (peripheral vascular disease)     status post fem-fem bypass in sept/2009  . Junctional bradycardia     in the setting of hyperkalemia in the past  . GI bleeding   . Coronary artery disease   . Mitral regurgitation     status post annuloplasty ring.  . Pulmonary hypertension     moderate  . Hypertension   . Diabetes mellitus   . Renal insufficiency     in feb. 2008, acute on chronic    Past Surgical History  Procedure Date  . Coronary artery bypass graft     LIMA to the LAD, SVG to PDA, SVG to posterolateral, SVG to diagonal 2000  . Cholecystectomy   . Amputation     partial of right thumb   ROS:  As stated in the HPI and negative for all other systems.  PHYSICAL EXAM BP 152/70  Pulse 79  Resp 18  Ht 5\' 5"  (1.651 m)  Wt 172 lb (78.019 kg)  BMI 28.62 kg/m2 GENERAL:  Well appearing HEENT:  Pupils equal round and reactive, fundi not visualized, oral mucosa unremarkable NECK:  No jugular venous distention, waveform within normal limits, carotid upstroke brisk and symmetric, positive bilateral bruits, no thyromegaly LYMPHATICS:  No  cervical, inguinal adenopathy LUNGS:  Clear to auscultation bilaterally BACK:  No CVA tenderness CHEST:  Well healed sternotomy scar. HEART:  PMI not displaced or sustained,S1 and S2 within normal limits, no S3, no S4, no clicks, no rubs, apical systolic murmurs ABD:  Flat, positive bowel sounds normal in frequency in pitch, no bruits, no rebound, no guarding, no midline pulsatile mass, no hepatomegaly, no splenomegaly EXT:  2 plus pulses upper and diminished PT/DP bilateral, bilateral lower extremity edema, no cyanosis no clubbing SKIN:  No rashes no nodules NEURO:  Cranial nerves II through XII grossly intact, motor grossly intact throughout PSYCH:  Cognitively intact, oriented to person place and time  EKG:  Atrial fibrillation, rate 83, axis within normal limits, intervals within normal limits, no acute ST-T wave change  ASSESSMENT AND PLAN

## 2011-06-06 NOTE — Patient Instructions (Addendum)
The current medical regimen is effective;  continue present plan and medications.  Follow up in 1 year with Dr Hochrein.  You will receive a letter in the mail 2 months before you are due.  Please call us when you receive this letter to schedule your follow up appointment.  

## 2011-06-06 NOTE — Assessment & Plan Note (Signed)
The patient has no new sypmtoms.  No further cardiovascular testing is indicated.  We will continue with aggressive risk reduction and meds as listed.  

## 2011-08-20 ENCOUNTER — Encounter: Payer: Self-pay | Admitting: Cardiology

## 2012-03-19 ENCOUNTER — Encounter: Payer: Self-pay | Admitting: Vascular Surgery

## 2013-04-28 ENCOUNTER — Other Ambulatory Visit (HOSPITAL_COMMUNITY): Payer: Self-pay | Admitting: *Deleted

## 2013-04-29 ENCOUNTER — Encounter (HOSPITAL_COMMUNITY)
Admission: RE | Admit: 2013-04-29 | Discharge: 2013-04-29 | Disposition: A | Discharge status: Home / Self Care | Payer: Medicare Other | Source: Ambulatory Visit | Attending: Nephrology | Admitting: Nephrology

## 2013-04-29 DIAGNOSIS — D509 Iron deficiency anemia, unspecified: Secondary | ICD-10-CM | POA: Insufficient documentation

## 2013-04-29 MED ORDER — SODIUM CHLORIDE 0.9 % IV SOLN
1020.0000 mg | Freq: Once | INTRAVENOUS | Status: AC
  Administered 2013-04-29: 1020 mg via INTRAVENOUS
  Filled 2013-04-29: qty 34

## 2013-08-03 ENCOUNTER — Encounter (HOSPITAL_BASED_OUTPATIENT_CLINIC_OR_DEPARTMENT_OTHER): Payer: Medicare Other | Attending: Plastic Surgery

## 2014-02-16 ENCOUNTER — Other Ambulatory Visit: Payer: Self-pay

## 2014-02-16 ENCOUNTER — Inpatient Hospital Stay (HOSPITAL_COMMUNITY)
Admission: EM | Admit: 2014-02-16 | Discharge: 2014-02-20 | DRG: 683 | Disposition: A | Discharge status: Home Health | Payer: Medicare Other | Attending: Internal Medicine | Admitting: Internal Medicine

## 2014-02-16 ENCOUNTER — Emergency Department (HOSPITAL_COMMUNITY): Payer: Medicare Other

## 2014-02-16 ENCOUNTER — Encounter (HOSPITAL_COMMUNITY): Payer: Self-pay | Admitting: Emergency Medicine

## 2014-02-16 DIAGNOSIS — Y9301 Activity, walking, marching and hiking: Secondary | ICD-10-CM | POA: Diagnosis not present

## 2014-02-16 DIAGNOSIS — E11649 Type 2 diabetes mellitus with hypoglycemia without coma: Secondary | ICD-10-CM | POA: Diagnosis present

## 2014-02-16 DIAGNOSIS — I480 Paroxysmal atrial fibrillation: Secondary | ICD-10-CM | POA: Diagnosis present

## 2014-02-16 DIAGNOSIS — E1122 Type 2 diabetes mellitus with diabetic chronic kidney disease: Secondary | ICD-10-CM | POA: Diagnosis present

## 2014-02-16 DIAGNOSIS — L03116 Cellulitis of left lower limb: Secondary | ICD-10-CM | POA: Diagnosis present

## 2014-02-16 DIAGNOSIS — L03115 Cellulitis of right lower limb: Secondary | ICD-10-CM | POA: Diagnosis present

## 2014-02-16 DIAGNOSIS — Z87891 Personal history of nicotine dependence: Secondary | ICD-10-CM

## 2014-02-16 DIAGNOSIS — M109 Gout, unspecified: Secondary | ICD-10-CM | POA: Diagnosis present

## 2014-02-16 DIAGNOSIS — I739 Peripheral vascular disease, unspecified: Secondary | ICD-10-CM | POA: Diagnosis present

## 2014-02-16 DIAGNOSIS — E1129 Type 2 diabetes mellitus with other diabetic kidney complication: Secondary | ICD-10-CM | POA: Diagnosis present

## 2014-02-16 DIAGNOSIS — X58XXXA Exposure to other specified factors, initial encounter: Secondary | ICD-10-CM | POA: Diagnosis present

## 2014-02-16 DIAGNOSIS — I272 Other secondary pulmonary hypertension: Secondary | ICD-10-CM | POA: Diagnosis present

## 2014-02-16 DIAGNOSIS — Z809 Family history of malignant neoplasm, unspecified: Secondary | ICD-10-CM

## 2014-02-16 DIAGNOSIS — W1830XA Fall on same level, unspecified, initial encounter: Secondary | ICD-10-CM | POA: Diagnosis present

## 2014-02-16 DIAGNOSIS — Z951 Presence of aortocoronary bypass graft: Secondary | ICD-10-CM | POA: Diagnosis not present

## 2014-02-16 DIAGNOSIS — S50311A Abrasion of right elbow, initial encounter: Secondary | ICD-10-CM | POA: Diagnosis present

## 2014-02-16 DIAGNOSIS — D649 Anemia, unspecified: Secondary | ICD-10-CM | POA: Diagnosis present

## 2014-02-16 DIAGNOSIS — E871 Hypo-osmolality and hyponatremia: Secondary | ICD-10-CM | POA: Diagnosis present

## 2014-02-16 DIAGNOSIS — R197 Diarrhea, unspecified: Secondary | ICD-10-CM | POA: Diagnosis present

## 2014-02-16 DIAGNOSIS — N179 Acute kidney failure, unspecified: Secondary | ICD-10-CM | POA: Diagnosis present

## 2014-02-16 DIAGNOSIS — I129 Hypertensive chronic kidney disease with stage 1 through stage 4 chronic kidney disease, or unspecified chronic kidney disease: Secondary | ICD-10-CM | POA: Diagnosis present

## 2014-02-16 DIAGNOSIS — T50905A Adverse effect of unspecified drugs, medicaments and biological substances, initial encounter: Secondary | ICD-10-CM | POA: Diagnosis present

## 2014-02-16 DIAGNOSIS — N184 Chronic kidney disease, stage 4 (severe): Secondary | ICD-10-CM | POA: Diagnosis present

## 2014-02-16 DIAGNOSIS — I482 Chronic atrial fibrillation, unspecified: Secondary | ICD-10-CM

## 2014-02-16 DIAGNOSIS — S82831A Other fracture of upper and lower end of right fibula, initial encounter for closed fracture: Secondary | ICD-10-CM

## 2014-02-16 DIAGNOSIS — I2510 Atherosclerotic heart disease of native coronary artery without angina pectoris: Secondary | ICD-10-CM | POA: Diagnosis present

## 2014-02-16 DIAGNOSIS — Z8249 Family history of ischemic heart disease and other diseases of the circulatory system: Secondary | ICD-10-CM | POA: Diagnosis not present

## 2014-02-16 DIAGNOSIS — S8261XA Displaced fracture of lateral malleolus of right fibula, initial encounter for closed fracture: Secondary | ICD-10-CM | POA: Diagnosis present

## 2014-02-16 DIAGNOSIS — S8264XA Nondisplaced fracture of lateral malleolus of right fibula, initial encounter for closed fracture: Secondary | ICD-10-CM | POA: Diagnosis present

## 2014-02-16 DIAGNOSIS — I4891 Unspecified atrial fibrillation: Secondary | ICD-10-CM | POA: Diagnosis present

## 2014-02-16 DIAGNOSIS — R339 Retention of urine, unspecified: Secondary | ICD-10-CM | POA: Diagnosis present

## 2014-02-16 DIAGNOSIS — N189 Chronic kidney disease, unspecified: Secondary | ICD-10-CM

## 2014-02-16 LAB — COMPREHENSIVE METABOLIC PANEL
ALT: 24 U/L (ref 0–35)
ANION GAP: 20 — AB (ref 5–15)
AST: 23 U/L (ref 0–37)
Albumin: 2.1 g/dL — ABNORMAL LOW (ref 3.5–5.2)
Alkaline Phosphatase: 89 U/L (ref 39–117)
BILIRUBIN TOTAL: 0.5 mg/dL (ref 0.3–1.2)
BUN: 110 mg/dL — AB (ref 6–23)
CALCIUM: 8 mg/dL — AB (ref 8.4–10.5)
CHLORIDE: 94 meq/L — AB (ref 96–112)
CO2: 19 meq/L (ref 19–32)
CREATININE: 3.24 mg/dL — AB (ref 0.50–1.10)
GFR, EST AFRICAN AMERICAN: 15 mL/min — AB (ref >90)
GFR, EST NON AFRICAN AMERICAN: 13 mL/min — AB (ref >90)
GLUCOSE: 50 mg/dL — AB (ref 70–99)
Potassium: 3.5 mEq/L — ABNORMAL LOW (ref 3.7–5.3)
Sodium: 133 mEq/L — ABNORMAL LOW (ref 137–147)
Total Protein: 5.4 g/dL — ABNORMAL LOW (ref 6.0–8.3)

## 2014-02-16 LAB — CBC WITH DIFFERENTIAL/PLATELET
Basophils Absolute: 0 10*3/uL (ref 0.0–0.1)
Basophils Relative: 0 % (ref 0–1)
EOS PCT: 2 % (ref 0–5)
Eosinophils Absolute: 0.2 10*3/uL (ref 0.0–0.7)
HEMATOCRIT: 34.7 % — AB (ref 36.0–46.0)
HEMOGLOBIN: 11.8 g/dL — AB (ref 12.0–15.0)
LYMPHS PCT: 4 % — AB (ref 12–46)
Lymphs Abs: 0.2 10*3/uL — ABNORMAL LOW (ref 0.7–4.0)
MCH: 26.9 pg (ref 26.0–34.0)
MCHC: 34 g/dL (ref 30.0–36.0)
MCV: 79 fL (ref 78.0–100.0)
MONO ABS: 0.7 10*3/uL (ref 0.1–1.0)
MONOS PCT: 10 % (ref 3–12)
NEUTROS ABS: 5.8 10*3/uL (ref 1.7–7.7)
Neutrophils Relative %: 84 % — ABNORMAL HIGH (ref 43–77)
Platelets: 221 10*3/uL (ref 150–400)
RBC: 4.39 MIL/uL (ref 3.87–5.11)
RDW: 14.5 % (ref 11.5–15.5)
WBC: 6.9 10*3/uL (ref 4.0–10.5)

## 2014-02-16 LAB — URINE MICROSCOPIC-ADD ON

## 2014-02-16 LAB — URINALYSIS, ROUTINE W REFLEX MICROSCOPIC
Bilirubin Urine: NEGATIVE
GLUCOSE, UA: NEGATIVE mg/dL
KETONES UR: NEGATIVE mg/dL
LEUKOCYTES UA: NEGATIVE
NITRITE: NEGATIVE
Specific Gravity, Urine: 1.02 (ref 1.005–1.030)
UROBILINOGEN UA: 0.2 mg/dL (ref 0.0–1.0)
pH: 5 (ref 5.0–8.0)

## 2014-02-16 MED ORDER — VANCOMYCIN HCL IN DEXTROSE 1-5 GM/200ML-% IV SOLN
1000.0000 mg | INTRAVENOUS | Status: DC
  Administered 2014-02-16 – 2014-02-18 (×2): 1000 mg via INTRAVENOUS
  Filled 2014-02-16 (×3): qty 200

## 2014-02-16 MED ORDER — LOPERAMIDE HCL 2 MG PO CAPS
4.0000 mg | ORAL_CAPSULE | Freq: Once | ORAL | Status: AC
  Administered 2014-02-16: 4 mg via ORAL
  Filled 2014-02-16: qty 2

## 2014-02-16 MED ORDER — PIPERACILLIN-TAZOBACTAM IN DEX 2-0.25 GM/50ML IV SOLN
2.2500 g | Freq: Three times a day (TID) | INTRAVENOUS | Status: DC
  Administered 2014-02-16 – 2014-02-19 (×9): 2.25 g via INTRAVENOUS
  Filled 2014-02-16 (×19): qty 50

## 2014-02-16 MED ORDER — SODIUM CHLORIDE 0.9 % IV BOLUS (SEPSIS)
1000.0000 mL | Freq: Once | INTRAVENOUS | Status: AC
  Administered 2014-02-16: 1000 mL via INTRAVENOUS

## 2014-02-16 NOTE — ED Notes (Signed)
Patient has had two loose stools within five minutes. Patient cleaned and changed. Patient not able to void at this time. In and out cath with no results at this time.

## 2014-02-16 NOTE — ED Notes (Signed)
Pt has 120 ml of urine in bladder by scanner. Pt states she does pee a little at a time. States she takes her fluid pill and does not drink very much

## 2014-02-16 NOTE — H&P (Signed)
[facility] History and Physical  [name] YPP:[id number] DOB: 18-Mar-1936 DOA: 02/16/2014   PCP: Sherrie Mustache, MD  Specialists: Dr. Moshe Cipro is her nephrologist  Chief Complaint: Diarrhea, not making urine, leg pain  HPI: [name] is a 78 y.o. female with a past medical history of coronary artery disease, status post CABG, chronic kidney disease, gout, diabetes mellitus, peripheral artery disease, who is a very poor historian. She presented to the hospital mainly complaining of a pain in both her legs, specially, the right one because she twisted her ankle a few days ago. And she also mentioned, that she's been making less urine for the last few days. She's unable to really elaborate on any of her symptoms. She however, does tell me that she was started on medications for gout about 2-3 weeks ago. This resulted in onset of severe diarrhea. She stopped taking her medicine a few days ago, but the diarrhea has persisted. She denies any abdominal pain. Has had nausea, but no vomiting. No passing out spells. She's had innumerable amounts of loose stool. Denies any blood in the stool. No fever. No chills. She was initially very reluctant to stay in the hospital, but now is agreeable. History is very limited.  Home Medications: Prior to Admission medications   Medication Sig Start Date End Date Taking? Authorizing Provider  amLODipine (NORVASC) 10 MG tablet Take 10 mg by mouth every morning.   Yes Historical Provider, MD  furosemide (LASIX) 40 MG tablet Take 80 mg by mouth 2 (two) times daily.    Yes Historical Provider, MD  glipiZIDE (GLUCOTROL) 10 MG tablet Take 10 mg by mouth daily.  02/11/14  Yes Historical Provider, MD  mometasone (ELOCON) 0.1 % cream Apply 1 application topically 2 (two) times daily.  02/11/14  Yes Historical Provider, MD    Allergies:  Allergies  Allergen Reactions  . Sulfonamide Derivatives Itching    Past Medical History: Past Medical  History  Diagnosis Date  . Paroxysmal atrial fibrillation   . PVD (peripheral vascular disease)     status post fem-fem bypass in sept/2009  . Junctional bradycardia     in the setting of hyperkalemia in the past  . GI bleeding   . Coronary artery disease   . Mitral regurgitation     status post annuloplasty ring.  . Pulmonary hypertension     moderate  . Hypertension   . Diabetes mellitus   . Renal insufficiency     in feb. 2008, acute on chronic    Past Surgical History  Procedure Laterality Date  . Coronary artery bypass graft      LIMA to the LAD, SVG to PDA, SVG to posterolateral, SVG to diagonal 2000  . Cholecystectomy    . Amputation      partial of right thumb    Social History: She lives by herself. However, her family lives close by. Quit smoking 15 years ago. No alcohol use. No illicit drug use. Uses a cane or walker to ambulate.  Family History:  Family History  Problem Relation Age of Onset  . Cancer Mother     deceased  . Heart attack Father     deceased     Review of Systems - History obtained from the patient General ROS: positive for  - fatigue Psychological ROS: negative Ophthalmic ROS: negative ENT ROS: negative Allergy and Immunology ROS: negative Hematological and Lymphatic ROS: negative Endocrine ROS: negative Respiratory ROS: no cough, shortness of breath, or wheezing  Cardiovascular ROS: no chest pain or dyspnea on exertion Gastrointestinal ROS: diarrhea Genito-Urinary ROS: as in hpi Musculoskeletal ROS: as in hpi Neurological ROS: no TIA or stroke symptoms Dermatological ROS: negative  Physical Examination  Filed Vitals:   02/16/14 1945 02/16/14 1951 02/16/14 2030 02/16/14 2054  BP: 130/60 130/60 131/62 131/62  Pulse:  66 36   Temp:      TempSrc:      Resp: '28 23 22 20  ' Height:      Weight:      SpO2:  94% 95%     BP 131/62  Pulse 36  Temp(Src) 98.4 F (36.9 C) (Oral)  Resp 20  Ht '5\' 5"'  (1.651 m)  Wt 72.576 kg (160 lb)   BMI 26.63 kg/m2  SpO2 95%  General appearance: alert, cooperative, appears stated age and no distress Head: Normocephalic, without obvious abnormality, atraumatic Eyes: conjunctivae/corneas clear. PERRL, EOM's intact.  Throat: Slightly dry. Mucous membranes Neck: no adenopathy, no carotid bruit, no JVD, supple, symmetrical, trachea midline and thyroid not enlarged, symmetric, no tenderness/mass/nodules Resp: clear to auscultation bilaterally Cardio: S1-S2 is irregularly irregular. No S3, S4. No rubs, murmurs, bruit. GI: soft, non-tender; bowel sounds normal; no masses,  no organomegaly Extremities: Erythema of both lower extremities with shallow open ulcerations. Warm to touch. Poor peripheral pulses. Pulses: Pulses are poorly palpable in the lower extremity Skin: Erythema over lower extremities Lymph nodes: Cervical, supraclavicular, and axillary nodes normal. Neurologic: She is alert. Oriented x3. No focal neurological deficits are noted  Laboratory Data: Results for orders placed during the hospital encounter of 02/16/14 (from the past 48 hour(s))  CBC WITH DIFFERENTIAL     Status: Abnormal   Collection Time    02/16/14  7:30 PM      Result Value Ref Range   WBC 6.9  4.0 - 10.5 K/uL   RBC 4.39  3.87 - 5.11 MIL/uL   Hemoglobin 11.8 (*) 12.0 - 15.0 g/dL   HCT 34.7 (*) 36.0 - 46.0 %   MCV 79.0  78.0 - 100.0 fL   MCH 26.9  26.0 - 34.0 pg   MCHC 34.0  30.0 - 36.0 g/dL   RDW 14.5  11.5 - 15.5 %   Platelets 221  150 - 400 K/uL   Neutrophils Relative % 84 (*) 43 - 77 %   Neutro Abs 5.8  1.7 - 7.7 K/uL   Lymphocytes Relative 4 (*) 12 - 46 %   Lymphs Abs 0.2 (*) 0.7 - 4.0 K/uL   Monocytes Relative 10  3 - 12 %   Monocytes Absolute 0.7  0.1 - 1.0 K/uL   Eosinophils Relative 2  0 - 5 %   Eosinophils Absolute 0.2  0.0 - 0.7 K/uL   Basophils Relative 0  0 - 1 %   Basophils Absolute 0.0  0.0 - 0.1 K/uL  COMPREHENSIVE METABOLIC PANEL     Status: Abnormal   Collection Time    02/16/14   7:30 PM      Result Value Ref Range   Sodium 133 (*) 137 - 147 mEq/L   Potassium 3.5 (*) 3.7 - 5.3 mEq/L   Chloride 94 (*) 96 - 112 mEq/L   CO2 19  19 - 32 mEq/L   Glucose, Bld 50 (*) 70 - 99 mg/dL   BUN 110 (*) 6 - 23 mg/dL   Creatinine, Ser 3.24 (*) 0.50 - 1.10 mg/dL   Calcium 8.0 (*) 8.4 - 10.5 mg/dL   Total Protein 5.4 (*)  6.0 - 8.3 g/dL   Albumin 2.1 (*) 3.5 - 5.2 g/dL   AST 23  0 - 37 U/L   ALT 24  0 - 35 U/L   Alkaline Phosphatase 89  39 - 117 U/L   Total Bilirubin 0.5  0.3 - 1.2 mg/dL   GFR calc non Af Amer 13 (*) >90 mL/min   GFR calc Af Amer 15 (*) >90 mL/min   Comment: (NOTE)     The eGFR has been calculated using the CKD EPI equation.     This calculation has not been validated in all clinical situations.     eGFR's persistently <90 mL/min signify possible Chronic Kidney     Disease.   Anion gap 20 (*) 5 - 15  URINALYSIS, ROUTINE W REFLEX MICROSCOPIC     Status: Abnormal   Collection Time    02/16/14  8:45 PM      Result Value Ref Range   Color, Urine YELLOW  YELLOW   APPearance CLEAR  CLEAR   Specific Gravity, Urine 1.020  1.005 - 1.030   pH 5.0  5.0 - 8.0   Glucose, UA NEGATIVE  NEGATIVE mg/dL   Hgb urine dipstick TRACE (*) NEGATIVE   Bilirubin Urine NEGATIVE  NEGATIVE   Ketones, ur NEGATIVE  NEGATIVE mg/dL   Protein, ur TRACE (*) NEGATIVE mg/dL   Urobilinogen, UA 0.2  0.0 - 1.0 mg/dL   Nitrite NEGATIVE  NEGATIVE   Leukocytes, UA NEGATIVE  NEGATIVE  URINE MICROSCOPIC-ADD ON     Status: Abnormal   Collection Time    02/16/14  8:45 PM      Result Value Ref Range   Squamous Epithelial / LPF FEW (*) RARE   WBC, UA 0-2  <3 WBC/hpf   RBC / HPF 0-2  <3 RBC/hpf   Bacteria, UA FEW (*) RARE   Casts HYALINE CASTS (*) NEGATIVE   Comment: GRANULAR CAST    Radiology Reports: Dg Ankle Complete Right  02/16/2014   CLINICAL DATA:  Patient fell while walking with a walker 4 days ago. Edema and erythema involving the right ankle. Initial encounter.  EXAM: RIGHT ANKLE  - COMPLETE 3+ VIEW  COMPARISON:  None.  FINDINGS: Minimally displaced avulsion fracture involving the tip of the lateral malleolus. No other fractures. Ankle mortise intact with mild joint space narrowing. Generalized osseous demineralization. Small joint effusion. Small plantar calcaneal spur. Extensive calcification involving the tibioperoneal arteries and the dorsalis pedis and plantar arteries.  IMPRESSION: Minimally displaced avulsion fracture involving the tip of the lateral malleolus. No other fractures. Osseous demineralization.   Electronically Signed   By: Evangeline Dakin M.D.   On: 02/16/2014 20:38    Electrocardiogram: EKG shows atrial fibrillation with a rate in the 80s. PVCs are noted. Nonspecific ST and T wave changes.  Problem List  Principal Problem:   Acute on chronic renal failure Active Problems:   ATRIAL FIBRILLATION, PAROXYSMAL   Peripheral vascular disease   Acute diarrhea   DM type 2 causing renal disease   CKD (chronic kidney disease) stage 4, GFR 15-29 ml/min   Fracture of right ankle, lateral malleolus   ARF (acute renal failure)   Assessment: This is a 78 year old, Caucasian female, who presents with multiple complaints, but mainly appears to have acute on chronic kidney failure, acute, diarrhea most likely due to gout medications. She also has evidence for cellulitis of her lower extremities with shallow ulcerations. X-rays showed a lateral malleolus fracture in the right leg.  Plan: #1 acute and chronic kidney disease stage IV: This is most likely due to volume loss from diarrhea. We will give her IV fluids. Hold her Lasix for now. Ultrasound of the kidneys will be checked in the morning. If renal function does not improve, nephrology input may be necessary. Monitor urine output closely.  #2 right lateral malleolus fracture: Consult orthopedic surgeon. Pain control. Bed rest for now till Orthopedic input is available  #3 lower extremity cellulitis bilaterally  with shallow ulcerations: Treat her with broad-spectrum antibiotics for now. She likely has arterial insufficiency. We will check arterial Dopplers. She does have a history of bypass surgery to the lower extremity. We'll also check venous Doppler. Wound care consultation will be obtained.  #4 diabetes mellitus, type II, causing renal disease: Sliding scale insulin coverage will be initiated. Check HbA1c in the morning.  #5 history of chronic kidney disease stage IV: She is followed by nephrology in [place]. Dialysis has been discussed with her but she currently is not on dialysis. Apparently, has refused graft placement in preparation for dialysis.  #6 history of coronary artery disease, status post CABG: Appears to be stable. Continue to monitor.  #7 history of chronic atrial fibrillation: Also appears to be stable. Monitor on telemetry. She has refused anticoagulation in the past based on cardiology notes.  #8 acute diarrhea: Most likely due to gout medications. Even, though it's not on her home medication list she tells me that she was prescribed colchicine. She has been given Imodium in the ED. We will check stool for C. difficile. Monitor for now. Abdomen is benign. No need for imaging studies.   DVT Prophylaxis: Heparin Code Status: Full code Family Communication: Discussed with the patient  Disposition Plan: Admit to telemetry   Further management decisions will depend on results of further testing and patient's response to treatment.   [facility]  [facility] Pager [phone]  If 7PM-7AM, please contact night-coverage [email or web address] Password [email or web address]  02/16/2014, 10:33 PM

## 2014-02-16 NOTE — Progress Notes (Signed)
ANTIBIOTIC CONSULT NOTE - INITIAL  Pharmacy Consult for Vancomycin & Zosyn Indication: Cellulitis/ wound infection  Allergies  Allergen Reactions  . Sulfonamide Derivatives Itching    Patient Measurements: Height: 5\' 5"  (165.1 cm) Weight: 160 lb (72.576 kg) IBW/kg (Calculated) : 57 Adjusted Body Weight:   Vital Signs: Temp: 98.4 F (36.9 C) (10/06 1636) Temp Source: Oral (10/06 1636) BP: 131/62 mmHg (10/06 2054) Pulse Rate: 36 (10/06 2030) Intake/Output from previous day:   Intake/Output from this shift: Total I/O In: 1000 [I.V.:1000] Out: -   Labs:  Recent Labs  02/16/14 1930  WBC 6.9  HGB 11.8*  PLT 221  CREATININE 3.24*   Estimated Creatinine Clearance: 14.7 ml/min (by C-G formula based on Cr of 3.24). No results found for this basename: VANCOTROUGH, VANCOPEAK, VANCORANDOM, GENTTROUGH, GENTPEAK, GENTRANDOM, TOBRATROUGH, TOBRAPEAK, TOBRARND, AMIKACINPEAK, AMIKACINTROU, AMIKACIN,  in the last 72 hours   Microbiology: No results found for this or any previous visit (from the past 720 hour(s)).  Medical History: Past Medical History  Diagnosis Date  . Paroxysmal atrial fibrillation   . PVD (peripheral vascular disease)     status post fem-fem bypass in sept/2009  . Junctional bradycardia     in the setting of hyperkalemia in the past  . GI bleeding   . Coronary artery disease   . Mitral regurgitation     status post annuloplasty ring.  . Pulmonary hypertension     moderate  . Hypertension   . Diabetes mellitus   . Renal insufficiency     in feb. 2008, acute on chronic    Medications:  Scheduled:   Assessment: ED patient for Vancomycin & Zosyn for cellulitis and wound infection Reduced renal function, CrCl < 20 ml/min Labs reviewed  Goal of Therapy:  Vancomycin trough level 15-20 mcg/ml  Plan:  Vancomycin 1 GM IV every 48 hours Zosyn 2.25 GM IV every 8 hours Vancomycin trough at steady state Monitor renal function Labs per  protocol  Raquel JamesPittman, [place] Bennett 02/16/2014,9:29 PM

## 2014-02-16 NOTE — ED Notes (Signed)
Spoke w/ EDP pt does not want to be admitted. Family called to room to assist in convincing pt to stay.

## 2014-02-16 NOTE — ED Notes (Signed)
Pt given crackers & drink due to glucose, EDP notified.

## 2014-02-16 NOTE — ED Provider Notes (Signed)
CSN: [id number]     Arrival date & time 02/16/14  1631 History   First MD Initiated Contact with Patient 02/16/14 1655     Chief Complaint  Patient presents with  . Urinary Retention     (Consider location/radiation/quality/duration/timing/severity/associated sxs/prior Treatment) HPI Patient states she started having swelling and blistering on her lower extremities that started 2-3 weeks ago. She denies fever or chills. She states last night she was walking and her right leg gave out on her and she twisted her right ankle. She states the only pain she has is in her right ankle. She also reports a decreased appetite and has had diarrhea for about 3 days with 3-4 episodes a day that is described as loose. She denies any abdominal pain, dizziness, or lightheadedness. She states she started having decreased urinary output since last night however she feels the need to urinate and can't. She states she has renal disease. She states she has had bypass surgery in her legs and also coronary artery bypass grafting 15 years ago.  PCP Dr Lysbeth Galas Cardiology Dr Antoine Poche  Past Medical History  Diagnosis Date  . Paroxysmal atrial fibrillation   . PVD (peripheral vascular disease)     status post fem-fem bypass in sept/2009  . Junctional bradycardia     in the setting of hyperkalemia in the past  . GI bleeding   . Coronary artery disease   . Mitral regurgitation     status post annuloplasty ring.  . Pulmonary hypertension     moderate  . Hypertension   . Diabetes mellitus   . Renal insufficiency     in feb. 2008, acute on chronic   Past Surgical History  Procedure Laterality Date  . Coronary artery bypass graft      LIMA to the LAD, SVG to PDA, SVG to posterolateral, SVG to diagonal 2000  . Cholecystectomy    . Amputation      partial of right thumb   Family History  Problem Relation Age of Onset  . Cancer Mother     deceased  . Heart attack Father     deceased   History  Substance  Use Topics  . Smoking status: Former Smoker    Types: Cigarettes    Quit date: 05/14/1998  . Smokeless tobacco: Not on file  . Alcohol Use: No   Lives at home Lives alone  OB History   Grav Para Term Preterm Abortions TAB SAB Ect Mult Living                 Review of Systems  All other systems reviewed and are negative.     Allergies  Sulfonamide derivatives  Home Medications   Prior to Admission medications   Medication Sig Start Date End Date Taking? Authorizing Provider  amLODipine (NORVASC) 10 MG tablet Take 10 mg by mouth every morning.   Yes Historical Provider, MD  furosemide (LASIX) 40 MG tablet Take 80 mg by mouth 2 (two) times daily.    Yes Historical Provider, MD  glipiZIDE (GLUCOTROL) 10 MG tablet Take 10 mg by mouth daily.  02/11/14  Yes Historical Provider, MD  mometasone (ELOCON) 0.1 % cream Apply 1 application topically 2 (two) times daily.  02/11/14  Yes Historical Provider, MD   BP 130/60  Pulse 66  Temp(Src) 98.4 F (36.9 C) (Oral)  Resp 23  Ht 5\' 5"  (1.651 m)  Wt 160 lb (72.576 kg)  BMI 26.63 kg/m2  SpO2 94%  Vital signs  normal   Physical Exam  Nursing note and vitals reviewed. Constitutional: She is oriented to person, place, and time. She appears well-developed and well-nourished.  Non-toxic appearance. She does not appear ill. No distress.  HENT:  Head: Normocephalic and atraumatic.  Right Ear: External ear normal.  Left Ear: External ear normal.  Nose: Nose normal. No mucosal edema or rhinorrhea.  Mouth/Throat: Oropharynx is clear and moist and mucous membranes are normal. No dental abscesses or uvula swelling.  Eyes: Conjunctivae and EOM are normal. Pupils are equal, round, and reactive to light.  Neck: Normal range of motion and full passive range of motion without pain. Neck supple.  Cardiovascular: Normal rate, regular rhythm and normal heart sounds.  Exam reveals no gallop and no friction rub.   No murmur heard. Pulmonary/Chest:  Effort normal and breath sounds normal. No respiratory distress. She has no wheezes. She has no rhonchi. She has no rales. She exhibits no tenderness and no crepitus.  Abdominal: Soft. Normal appearance and bowel sounds are normal. She exhibits no distension. There is no tenderness. There is no rebound and no guarding.  Musculoskeletal: Normal range of motion. She exhibits no edema and no tenderness.  Moves all extremities well. Patient's noted to have some purplish discoloration of her right toes.  Neurological: She is alert and oriented to person, place, and time. She has normal strength. No cranial nerve deficit.  Skin: Skin is warm, dry and intact. No rash noted. No erythema. There is pallor.  Patient is noted to have diffuse redness with clear fluid-filled blistering and orange peel changes of her skin of her lower legs between her ankles and her knees bilaterally. Please see photo.  Psychiatric: She has a normal mood and affect. Her speech is normal and behavior is normal. Her mood appears not anxious.             ED Course  Procedures (including critical care time)  Medications  piperacillin-tazobactam (ZOSYN) IVPB 2.25 g (not administered)  vancomycin (VANCOCIN) IVPB 1000 mg/200 mL premix (1,000 mg Intravenous New Bag/Given 02/16/14 2135)  loperamide (IMODIUM) capsule 4 mg (not administered)  sodium chloride 0.9 % bolus 1,000 mL (0 mLs Intravenous Stopped 02/16/14 2048)     Bladder scan shows 120 cc of urine.  I have discussed lab results with patient and her family. She is adamant she'll not be admitted to the hospital despite urgent bleeding from her family to be admitted. They state that she has refused to get the graft placed for imminent dialysis. Evidently the family called EMS on Saturday, 3 days ago and she refused to be transported to the ED. Patient states she will followup with her PCP next week. My concern is she has a chip fracture of her ankle however she has  cellulitis with open wounds of her lower extremities. I feel what we will do is wrap her legs up and place her in an ASO. I do not feel comfortable placing her in Ace splint or a Cam Walker with the open wounds.  22:00 Pt now willing to stay, a family member that she trusts has left and gone home. Pt now having some diarrhea.   22:10 Dr Rito EhrlichKrishnan, will come see patient for admission.   Labs Review Results for orders placed during the hospital encounter of 02/16/14  CBC WITH DIFFERENTIAL      Result Value Ref Range   WBC 6.9  4.0 - 10.5 K/uL   RBC 4.39  3.87 - 5.11 MIL/uL  Hemoglobin 11.8 (*) 12.0 - 15.0 g/dL   HCT 16.1 (*) 09.6 - 04.5 %   MCV 79.0  78.0 - 100.0 fL   MCH 26.9  26.0 - 34.0 pg   MCHC 34.0  30.0 - 36.0 g/dL   RDW 40.9  81.1 - 91.4 %   Platelets 221  150 - 400 K/uL   Neutrophils Relative % 84 (*) 43 - 77 %   Neutro Abs 5.8  1.7 - 7.7 K/uL   Lymphocytes Relative 4 (*) 12 - 46 %   Lymphs Abs 0.2 (*) 0.7 - 4.0 K/uL   Monocytes Relative 10  3 - 12 %   Monocytes Absolute 0.7  0.1 - 1.0 K/uL   Eosinophils Relative 2  0 - 5 %   Eosinophils Absolute 0.2  0.0 - 0.7 K/uL   Basophils Relative 0  0 - 1 %   Basophils Absolute 0.0  0.0 - 0.1 K/uL  COMPREHENSIVE METABOLIC PANEL      Result Value Ref Range   Sodium 133 (*) 137 - 147 mEq/L   Potassium 3.5 (*) 3.7 - 5.3 mEq/L   Chloride 94 (*) 96 - 112 mEq/L   CO2 19  19 - 32 mEq/L   Glucose, Bld 50 (*) 70 - 99 mg/dL   BUN 782 (*) 6 - 23 mg/dL   Creatinine, Ser 9.56 (*) 0.50 - 1.10 mg/dL   Calcium 8.0 (*) 8.4 - 10.5 mg/dL   Total Protein 5.4 (*) 6.0 - 8.3 g/dL   Albumin 2.1 (*) 3.5 - 5.2 g/dL   AST 23  0 - 37 U/L   ALT 24  0 - 35 U/L   Alkaline Phosphatase 89  39 - 117 U/L   Total Bilirubin 0.5  0.3 - 1.2 mg/dL   GFR calc non Af Amer 13 (*) >90 mL/min   GFR calc Af Amer 15 (*) >90 mL/min   Anion gap 20 (*) 5 - 15  URINALYSIS, ROUTINE W REFLEX MICROSCOPIC      Result Value Ref Range   Color, Urine YELLOW  YELLOW    APPearance CLEAR  CLEAR   Specific Gravity, Urine 1.020  1.005 - 1.030   pH 5.0  5.0 - 8.0   Glucose, UA NEGATIVE  NEGATIVE mg/dL   Hgb urine dipstick TRACE (*) NEGATIVE   Bilirubin Urine NEGATIVE  NEGATIVE   Ketones, ur NEGATIVE  NEGATIVE mg/dL   Protein, ur TRACE (*) NEGATIVE mg/dL   Urobilinogen, UA 0.2  0.0 - 1.0 mg/dL   Nitrite NEGATIVE  NEGATIVE   Leukocytes, UA NEGATIVE  NEGATIVE  URINE MICROSCOPIC-ADD ON      Result Value Ref Range   Squamous Epithelial / LPF FEW (*) RARE   WBC, UA 0-2  <3 WBC/hpf   RBC / HPF 0-2  <3 RBC/hpf   Bacteria, UA FEW (*) RARE   Casts HYALINE CASTS (*) NEGATIVE   Laboratory interpretation all normal except worsening of her anal insufficiency with very elevated BUN over 100. Malnutrition, minimal anemia     Imaging Review Dg Ankle Complete Right  02/16/2014   CLINICAL DATA:  Patient fell while walking with a walker 4 days ago. Edema and erythema involving the right ankle. Initial encounter.  EXAM: RIGHT ANKLE - COMPLETE 3+ VIEW  COMPARISON:  None.  FINDINGS: Minimally displaced avulsion fracture involving the tip of the lateral malleolus. No other fractures. Ankle mortise intact with mild joint space narrowing. Generalized osseous demineralization. Small joint effusion. Small plantar calcaneal  spur. Extensive calcification involving the tibioperoneal arteries and the dorsalis pedis and plantar arteries.  IMPRESSION: Minimally displaced avulsion fracture involving the tip of the lateral malleolus. No other fractures. Osseous demineralization.   Electronically Signed   By: Hulan Saas M.D.   On: 02/16/2014 20:38     EKG Interpretation None      MDM   Final diagnoses:  Closed avulsion fracture of distal fibula, right, initial encounter  Cellulitis of both lower extremities  Chronic renal failure, unspecified stage    Plan admission  Devoria Albe, MD, Franz Dell, MD 02/16/14 2215

## 2014-02-16 NOTE — ED Notes (Signed)
Pt soiled when entered the room. Pt cleaned & new linens put on bed.delay explained.

## 2014-02-16 NOTE — ED Notes (Signed)
Pt states she is not peeing very much. States the doctors have told her her kidneys are shutting down. Pt states her right leg gave out Friday and she fell.

## 2014-02-17 ENCOUNTER — Inpatient Hospital Stay (HOSPITAL_COMMUNITY): Payer: Medicare Other

## 2014-02-17 ENCOUNTER — Encounter (HOSPITAL_COMMUNITY): Payer: Self-pay | Admitting: *Deleted

## 2014-02-17 DIAGNOSIS — R197 Diarrhea, unspecified: Secondary | ICD-10-CM

## 2014-02-17 LAB — GLUCOSE, CAPILLARY
GLUCOSE-CAPILLARY: 94 mg/dL (ref 70–99)
GLUCOSE-CAPILLARY: 42 mg/dL — AB (ref 70–99)
GLUCOSE-CAPILLARY: 62 mg/dL — AB (ref 70–99)
GLUCOSE-CAPILLARY: 84 mg/dL (ref 70–99)
Glucose-Capillary: 45 mg/dL — ABNORMAL LOW (ref 70–99)
Glucose-Capillary: 57 mg/dL — ABNORMAL LOW (ref 70–99)
Glucose-Capillary: 77 mg/dL (ref 70–99)
Glucose-Capillary: 84 mg/dL (ref 70–99)
Glucose-Capillary: 98 mg/dL (ref 70–99)

## 2014-02-17 LAB — CBC
HCT: 32.9 % — ABNORMAL LOW (ref 36.0–46.0)
HEMOGLOBIN: 10.8 g/dL — AB (ref 12.0–15.0)
MCH: 26.2 pg (ref 26.0–34.0)
MCHC: 32.8 g/dL (ref 30.0–36.0)
MCV: 79.7 fL (ref 78.0–100.0)
Platelets: 171 10*3/uL (ref 150–400)
RBC: 4.13 MIL/uL (ref 3.87–5.11)
RDW: 14.4 % (ref 11.5–15.5)
WBC: 4.7 10*3/uL (ref 4.0–10.5)

## 2014-02-17 LAB — COMPREHENSIVE METABOLIC PANEL
ALK PHOS: 137 U/L — AB (ref 39–117)
ALT: 28 U/L (ref 0–35)
ANION GAP: 19 — AB (ref 5–15)
AST: 34 U/L (ref 0–37)
Albumin: 1.8 g/dL — ABNORMAL LOW (ref 3.5–5.2)
BILIRUBIN TOTAL: 0.8 mg/dL (ref 0.3–1.2)
BUN: 109 mg/dL — AB (ref 6–23)
CHLORIDE: 96 meq/L (ref 96–112)
CO2: 19 meq/L (ref 19–32)
Calcium: 7.4 mg/dL — ABNORMAL LOW (ref 8.4–10.5)
Creatinine, Ser: 3.25 mg/dL — ABNORMAL HIGH (ref 0.50–1.10)
GFR, EST AFRICAN AMERICAN: 15 mL/min — AB (ref >90)
GFR, EST NON AFRICAN AMERICAN: 13 mL/min — AB (ref >90)
Glucose, Bld: 49 mg/dL — ABNORMAL LOW (ref 70–99)
POTASSIUM: 3.4 meq/L — AB (ref 3.7–5.3)
SODIUM: 134 meq/L — AB (ref 137–147)
Total Protein: 4.7 g/dL — ABNORMAL LOW (ref 6.0–8.3)

## 2014-02-17 LAB — HEMOGLOBIN A1C
Hgb A1c MFr Bld: 11.5 % — ABNORMAL HIGH (ref <5.7)
Mean Plasma Glucose: 283 mg/dL — ABNORMAL HIGH (ref <117)

## 2014-02-17 LAB — CLOSTRIDIUM DIFFICILE BY PCR: Toxigenic C. Difficile by PCR: NEGATIVE

## 2014-02-17 LAB — PROTIME-INR
INR: 1.1 (ref 0.00–1.49)
PROTHROMBIN TIME: 14.2 s (ref 11.6–15.2)

## 2014-02-17 MED ORDER — HEPARIN SODIUM (PORCINE) 5000 UNIT/ML IJ SOLN
5000.0000 [IU] | Freq: Three times a day (TID) | INTRAMUSCULAR | Status: DC
  Administered 2014-02-17 – 2014-02-20 (×10): 5000 [IU] via SUBCUTANEOUS
  Filled 2014-02-17 (×10): qty 1

## 2014-02-17 MED ORDER — SODIUM CHLORIDE 0.45 % IV SOLN
INTRAVENOUS | Status: DC
  Administered 2014-02-17 (×2): via INTRAVENOUS

## 2014-02-17 MED ORDER — GLUCOSE 40 % PO GEL
1.0000 | ORAL | Status: DC | PRN
  Administered 2014-02-17 – 2014-02-18 (×2): 37.5 g via ORAL
  Filled 2014-02-17 (×2): qty 1

## 2014-02-17 MED ORDER — GLUCOSE 40 % PO GEL
ORAL | Status: AC
  Administered 2014-02-17: 37.5 g
  Filled 2014-02-17: qty 1

## 2014-02-17 MED ORDER — ACETAMINOPHEN 650 MG RE SUPP: 650.0000 mg | Freq: Four times a day (QID) | RECTAL | Status: DC | PRN

## 2014-02-17 MED ORDER — AMLODIPINE BESYLATE 5 MG PO TABS
10.0000 mg | ORAL_TABLET | Freq: Every morning | ORAL | Status: DC
  Administered 2014-02-17 – 2014-02-20 (×4): 10 mg via ORAL
  Filled 2014-02-17 (×4): qty 2

## 2014-02-17 MED ORDER — INSULIN ASPART 100 UNIT/ML ~~LOC~~ SOLN: 0.0000 [IU] | Freq: Three times a day (TID) | SUBCUTANEOUS | Status: DC

## 2014-02-17 MED ORDER — ACETAMINOPHEN 325 MG PO TABS
650.0000 mg | ORAL_TABLET | Freq: Four times a day (QID) | ORAL | Status: DC | PRN
  Administered 2014-02-17 – 2014-02-19 (×4): 650 mg via ORAL
  Filled 2014-02-17 (×4): qty 2

## 2014-02-17 MED ORDER — COLLAGENASE 250 UNIT/GM EX OINT
TOPICAL_OINTMENT | Freq: Every day | CUTANEOUS | Status: DC
  Administered 2014-02-17: 1 via TOPICAL
  Administered 2014-02-18 – 2014-02-20 (×3): via TOPICAL
  Filled 2014-02-17: qty 30

## 2014-02-17 MED ORDER — SACCHAROMYCES BOULARDII 250 MG PO CAPS
250.0000 mg | ORAL_CAPSULE | Freq: Two times a day (BID) | ORAL | Status: DC
  Administered 2014-02-17 – 2014-02-20 (×8): 250 mg via ORAL
  Filled 2014-02-17 (×8): qty 1

## 2014-02-17 MED ORDER — PIPERACILLIN-TAZOBACTAM IN DEX 2-0.25 GM/50ML IV SOLN
INTRAVENOUS | Status: AC
  Filled 2014-02-17: qty 100

## 2014-02-17 MED ORDER — MORPHINE SULFATE 2 MG/ML IJ SOLN: 1.0000 mg | INTRAMUSCULAR | Status: DC | PRN

## 2014-02-17 MED ORDER — SODIUM CHLORIDE 0.9 % IJ SOLN
3.0000 mL | Freq: Two times a day (BID) | INTRAMUSCULAR | Status: DC
  Administered 2014-02-17 – 2014-02-20 (×7): 3 mL via INTRAVENOUS

## 2014-02-17 MED ORDER — ONDANSETRON HCL 4 MG PO TABS: 4.0000 mg | ORAL_TABLET | Freq: Four times a day (QID) | ORAL | Status: DC | PRN

## 2014-02-17 MED ORDER — ONDANSETRON HCL 4 MG/2ML IJ SOLN
4.0000 mg | Freq: Four times a day (QID) | INTRAMUSCULAR | Status: DC | PRN
  Administered 2014-02-18 (×2): 4 mg via INTRAVENOUS
  Filled 2014-02-17 (×2): qty 2

## 2014-02-17 MED ORDER — OXYCODONE HCL 5 MG PO TABS
5.0000 mg | ORAL_TABLET | ORAL | Status: DC | PRN
  Administered 2014-02-17 – 2014-02-18 (×2): 5 mg via ORAL
  Filled 2014-02-17 (×2): qty 1

## 2014-02-17 MED ORDER — ALBUTEROL SULFATE (2.5 MG/3ML) 0.083% IN NEBU: 2.5000 mg | INHALATION_SOLUTION | RESPIRATORY_TRACT | Status: DC | PRN

## 2014-02-17 NOTE — Care Management Utilization Note (Signed)
UR completed 

## 2014-02-17 NOTE — Plan of Care (Signed)
Hypoglycemic Event  CBG: 45  Treatment: 15 GM carbohydrate snack  Symptoms: None  Follow-up CBG: Time:0800 CBG Result:57  Possible Reasons for Event: Inadequate meal intake  Comments/MD notified: MD notified at 0800 pt refused to drink or take snacks so pt was given oral glucose gel at 0800 will recheck in 15 min    Hyman HopesHolt, Sierra Vanriper L  Remember to initiate Hypoglycemia Order Set & complete

## 2014-02-17 NOTE — Progress Notes (Signed)
Patient has not voided since in the emergency department.  Bladder scan showed 11 cc.  Patient states that she does not feel like she needs to urinate.  On call MD notified.  Will continue to monitor.

## 2014-02-17 NOTE — Plan of Care (Signed)
Hypoglycemic Event  CBG: 42  Treatment: 15 GM carbohydrate snack  Symptoms: None  Follow-up CBG: Time:1809 CBG Result:84  Possible Reasons for Event: Inadequate meal intake  Comments/MD notified:MD notified.  Pt has poor intake and will not eat or drink as requested per protocol.  Pt is currently eating dinner.      Reva BoresHolt, Kyrese Gartman L  Remember to initiate Hypoglycemia Order Set & complete

## 2014-02-17 NOTE — Consult Note (Addendum)
WOC wound consult note Reason for Consult:Consult requested for buttocks and bilat legs.  Pt states she is followed as an outpatient for her right leg wound and a physician ordered "a white cream twice a day."  She does not recall the name of the cream and it is not listed in the EMR.  She has a sensitivity to Silvadene. Wound type: Left ankle developed blistering earlier this week, patient states.  This has evolved into 3 areas of partial thickness wounds; 1X1X.1cm, .8X.8X.1cm, 1.2X1.2X.1cm  All are pink and moist with minimal amt yellow drainage and no odor. Generalized edema and erythremia surrounding from cellulitis; pt is on IV antibiotics. Buttocks with patchy areas of partial thickness skin loss including 1.5X1.5X.1cm,she is having frequent loose stools.  Appearance consistent with moisture associated skin damage .Right leg with chronic wound; 6X4.5X.1cm; 80% red, 20% eschar/slough.  No odor , small amt yellow drainage.  Surrounding skin with  3 areas of partial thickness wounds; 3X3X.1cm, 6X2X.1cm, (leg) 1.2X1.2X.1cm (foot) All are pink and moist with minimal amt yellow drainage and no odor.  Dressing procedure/placement/frequency: Barrier cream to protect buttocks and repel moisture.  Foam dressing to left leg and right foot to promote healing.  Santyl to chemically debride nonviable tissue to right leg wound.  Pt can resume follow-up with outpatient physician after discharge. Discussed plan of care and se verbalizes understanding. Please re-consult if further assistance is needed.  Thank-you,  Cammie Mcgeeawn Latiesha Harada MSN, RN, CWOCN, [place], CNS [phone]

## 2014-02-17 NOTE — Progress Notes (Signed)
[facility] PROGRESS NOTE  Sierra PanderLorine E [name] ZOX:[id number] DOB: [birth year] DOA: 02/16/2014 PCP: Josue HectorNYLAND,LEONARD ROBERT, MD  Assessment/Plan: Acute on CKD Stage IV -Likely prerenal from GI losses. -Cr has plateaued. -Will recheck renal function in am.  Right Malleolus Fx -Await ortho recs.  Bilateral LE Cellulitis -Continue antibiotics. -Await arterial/venous dopplers.  Hypoglycemia/DM-II -Continue glucose gel and supportive measures. -DC SSI.  Diarrhea -C Diff negative. -Likely related to gout meds.  Code Status: Full Code Family Communication: patient only  Disposition Plan: To be determined   Consultants:  None   Antibiotics:  Vanc  Zosyn   Subjective: No appetite. Hypoglycemic.  Objective: Filed Vitals:   02/16/14 2054 02/17/14 0117 02/17/14 0505 02/17/14 1327  BP: 131/62 103/72 120/53 104/69  Pulse:  81 102 56  Temp:  98.5 F (36.9 C) 98.4 F (36.9 C) 98.2 F (36.8 C)  TempSrc:  Oral Oral Oral  Resp: 20  20 20   Height:  5\' 5"  (1.651 m)    Weight:  73 kg (160 lb 15 oz) 73 kg (160 lb 15 oz)   SpO2:  93% 97% 97%    Intake/Output Summary (Last 24 hours) at 02/17/14 1820 Last data filed at 02/17/14 1229  Gross per 24 hour  Intake 2246.67 ml  Output      1 ml  Net 2245.67 ml   Filed Weights   02/16/14 1636 02/17/14 0117 02/17/14 0505  Weight: 72.576 kg (160 lb) 73 kg (160 lb 15 oz) 73 kg (160 lb 15 oz)    Exam:   General:  AA Ox3  Cardiovascular: RRR  Respiratory: CTA B  Abdomen: S/NT/+BS  Extremities: trace edema   Neurologic:  Moves all 4 spontaneously.  Data Reviewed: Basic Metabolic Panel:  Recent Labs Lab 02/16/14 1930 02/17/14 0625  NA 133* 134*  K 3.5* 3.4*  CL 94* 96  CO2 19 19  GLUCOSE 50* 49*  BUN 110* 109*  CREATININE 3.24* 3.25*  CALCIUM 8.0* 7.4*   Liver Function Tests:  Recent Labs Lab 02/16/14 1930 02/17/14 0625  AST 23 34  ALT 24 28  ALKPHOS 89 137*  BILITOT 0.5 0.8  PROT 5.4* 4.7*    ALBUMIN 2.1* 1.8*   No results found for this basename: LIPASE, AMYLASE,  in the last 168 hours No results found for this basename: AMMONIA,  in the last 168 hours CBC:  Recent Labs Lab 02/16/14 1930 02/17/14 0625  WBC 6.9 4.7  NEUTROABS 5.8  --   HGB 11.8* 10.8*  HCT 34.7* 32.9*  MCV 79.0 79.7  PLT 221 171   Cardiac Enzymes: No results found for this basename: CKTOTAL, CKMB, CKMBINDEX, TROPONINI,  in the last 168 hours BNP (last 3 results) No results found for this basename: PROBNP,  in the last 8760 hours CBG:  Recent Labs Lab 02/17/14 0851 02/17/14 1112 02/17/14 1653 02/17/14 1733 02/17/14 1809  GLUCAP 77 94 42* 62* 84    Recent Results (from the past 240 hour(s))  CLOSTRIDIUM DIFFICILE BY PCR     Status: None   Collection Time    02/17/14  1:04 AM      Result Value Ref Range Status   C difficile by pcr NEGATIVE  NEGATIVE Final     Studies: Dg Ankle Complete Right  02/16/2014   CLINICAL DATA:  Patient fell while walking with a walker 4 days ago. Edema and erythema involving the right ankle. Initial encounter.  EXAM: RIGHT ANKLE - COMPLETE 3+ VIEW  COMPARISON:  None.  FINDINGS: Minimally displaced avulsion fracture involving the tip of the lateral malleolus. No other fractures. Ankle mortise intact with mild joint space narrowing. Generalized osseous demineralization. Small joint effusion. Small plantar calcaneal spur. Extensive calcification involving the tibioperoneal arteries and the dorsalis pedis and plantar arteries.  IMPRESSION: Minimally displaced avulsion fracture involving the tip of the lateral malleolus. No other fractures. Osseous demineralization.   Electronically Signed   By: Hulan Saas M.D.   On: 02/16/2014 20:38   US Renal  02/17/2014   CLINICAL DATA:  Acute renal failure, difficulty with urination  EXAM: RENAL/URINARY TRACT ULTRASOUND COMPLETE  COMPARISON:  None  FINDINGS: Examination limited by body habitus and patient inability to suspend  respiration.  Right Kidney:  Length: 10.5 cm marked cortical thinning. Increased cortical echogenicity. Multiple shadowing echogenic foci suggesting calcifications though uncertain if represent calculi or medullary calcinosis. No gross evidence of mass or hydronephrosis. No perinephric fluid.  Left Kidney:  Length: 10.2 cm. Marked cortical thinning. Increased cortical echogenicity. Hypoechoic nodule with scattered internal echogenicity at mid LEFT kidney, 1.9 x 1.9 x 2.0 cm. Additional echogenic and occasionally shadowing foci are seen within the LEFT kidney, uncertain if represent nonobstructing calculi or medullary calcinosis. No additional mass or perinephric fluid.  Bladder:  Well distended, unremarkable.  Ureteral jets not visualized.  IMPRESSION: Atrophic kidneys with medical renal disease changes bilaterally.  Echogenic and occasionally shadowing foci throughout both kidneys could represent nonobstructing calculi or medullary calcinosis.  Probable mildly complicated cyst mid LEFT kidney 2.0 cm in greatest size   Electronically Signed   By: Ulyses Southward M.D.   On: 02/17/2014 15:57   US Venous Img Lower Bilateral  02/17/2014   CLINICAL DATA:  Bilateral lower extremity pain and swelling.  EXAM: BILATERAL LOWER EXTREMITY VENOUS DOPPLER ULTRASOUND  TECHNIQUE: Gray-scale sonography with graded compression, as well as color Doppler and duplex ultrasound were performed to evaluate the lower extremity deep venous systems from the level of the common femoral vein and including the common femoral, femoral, profunda femoral, popliteal and calf veins including the posterior tibial, peroneal and gastrocnemius veins when visible. The superficial great saphenous vein was also interrogated. Spectral Doppler was utilized to evaluate flow at rest and with distal augmentation maneuvers in the common femoral, femoral and popliteal veins.  COMPARISON:  None.  FINDINGS: RIGHT LOWER EXTREMITY  Common Femoral Vein: No evidence of  thrombus. Normal compressibility, respiratory phasicity and response to augmentation.  Saphenofemoral Junction: No evidence of thrombus. Normal compressibility and flow on color Doppler imaging.  Profunda Femoral Vein: No evidence of thrombus. Normal compressibility and flow on color Doppler imaging.  Femoral Vein: No evidence of thrombus. Normal compressibility, respiratory phasicity and response to augmentation.  Popliteal Vein: No evidence of thrombus. Normal compressibility, respiratory phasicity and response to augmentation.  Calf Veins: No evidence of thrombus. Normal compressibility and flow on color Doppler imaging.  Superficial Great Saphenous Vein: No evidence of thrombus. Normal compressibility and flow on color Doppler imaging.  Venous Reflux:  None.  Other Findings:  None.  LEFT LOWER EXTREMITY  Common Femoral Vein: No evidence of thrombus. Normal compressibility, respiratory phasicity and response to augmentation.  Saphenofemoral Junction: No evidence of thrombus. Normal compressibility and flow on color Doppler imaging.  Profunda Femoral Vein: No evidence of thrombus. Normal compressibility and flow on color Doppler imaging.  Femoral Vein: No evidence of thrombus. Normal compressibility, respiratory phasicity and response to augmentation.  Popliteal Vein: No evidence of thrombus. Normal compressibility, respiratory phasicity  and response to augmentation.  Calf Veins: No evidence of thrombus. Normal compressibility and flow on color Doppler imaging.  Superficial Great Saphenous Vein: No evidence of thrombus. Normal compressibility and flow on color Doppler imaging.  Venous Reflux:  None.  Other Findings:  None.  IMPRESSION: No evidence of deep venous thrombosis seen in either lower extremity.   Electronically Signed   By: Roque Lias M.D.   On: 02/17/2014 16:59   US Arterial Seg Single  02/17/2014   CLINICAL DATA:  Lower extremity claudication and rest pain bilaterally.  EXAM: NONINVASIVE  PHYSIOLOGIC VASCULAR STUDY OF BILATERAL LOWER EXTREMITIES  TECHNIQUE: Evaluation of both lower extremities were performed at rest, including calculation of ankle-brachial indices with single level Doppler, pressure and pulse volume recording.  COMPARISON:  None.  FINDINGS: Right ABI: Ankle pressures are markedly elevated. ABI measurements are spurious.  Left ABI: Ankle pressures are markedly elevated. ABI measurements or spurious.  Right Lower Extremity: Posterior tibial and dorsalis pedis Doppler waveforms are monophasic.  Left Lower Extremity: Posterior tibial and dorsalis pedis waveforms are monophasic.  IMPRESSION: Severe bilateral lower extremity arterial occlusive disease.   Electronically Signed   By: Maryclare Bean M.D.   On: 02/17/2014 17:02    Scheduled Meds: . amLODipine  10 mg Oral q morning - 10a  . collagenase   Topical Daily  . heparin  5,000 Units Subcutaneous 3 times per day  . insulin aspart  0-9 Units Subcutaneous TID WC  . piperacillin-tazobactam (ZOSYN)  IV  2.25 g Intravenous Q8H  . saccharomyces boulardii  250 mg Oral BID  . sodium chloride  3 mL Intravenous Q12H  . vancomycin  1,000 mg Intravenous Q48H   Continuous Infusions: . sodium chloride 100 mL/hr at 02/17/14 0100    Principal Problem:   Acute on chronic renal failure Active Problems:   ATRIAL FIBRILLATION, PAROXYSMAL   Peripheral vascular disease   Acute diarrhea   DM type 2 causing renal disease   CKD (chronic kidney disease) stage 4, GFR 15-29 ml/min   Fracture of right ankle, lateral malleolus   ARF (acute renal failure)   Bilateral cellulitis of lower leg    Time spent: 30 minutes. Greater than 50% of this time was spent in direct contact with the patient coordinating care.    Chaya Jan  [facility] Pager [phone]  If 7PM-7AM, please contact night-coverage at [email or web address], password [facility] 02/17/2014, 6:20 PM  LOS: 1 day

## 2014-02-18 LAB — CBC
HCT: 32.3 % — ABNORMAL LOW (ref 36.0–46.0)
Hemoglobin: 11.1 g/dL — ABNORMAL LOW (ref 12.0–15.0)
MCH: 27.3 pg (ref 26.0–34.0)
MCHC: 34.4 g/dL (ref 30.0–36.0)
MCV: 79.4 fL (ref 78.0–100.0)
Platelets: 182 10*3/uL (ref 150–400)
RBC: 4.07 MIL/uL (ref 3.87–5.11)
RDW: 14.4 % (ref 11.5–15.5)
WBC: 3.3 10*3/uL — ABNORMAL LOW (ref 4.0–10.5)

## 2014-02-18 LAB — GLUCOSE, CAPILLARY
GLUCOSE-CAPILLARY: 38 mg/dL — AB (ref 70–99)
GLUCOSE-CAPILLARY: 191 mg/dL — AB (ref 70–99)
GLUCOSE-CAPILLARY: 136 mg/dL — AB (ref 70–99)
Glucose-Capillary: 47 mg/dL — ABNORMAL LOW (ref 70–99)
Glucose-Capillary: 171 mg/dL — ABNORMAL HIGH (ref 70–99)
Glucose-Capillary: 184 mg/dL — ABNORMAL HIGH (ref 70–99)
Glucose-Capillary: 164 mg/dL — ABNORMAL HIGH (ref 70–99)

## 2014-02-18 LAB — BASIC METABOLIC PANEL
Anion gap: 20 — ABNORMAL HIGH (ref 5–15)
BUN: 107 mg/dL — ABNORMAL HIGH (ref 6–23)
CO2: 18 mEq/L — ABNORMAL LOW (ref 19–32)
CREATININE: 3.28 mg/dL — AB (ref 0.50–1.10)
Calcium: 7.2 mg/dL — ABNORMAL LOW (ref 8.4–10.5)
Chloride: 93 mEq/L — ABNORMAL LOW (ref 96–112)
GFR, EST AFRICAN AMERICAN: 15 mL/min — AB (ref >90)
GFR, EST NON AFRICAN AMERICAN: 13 mL/min — AB (ref >90)
GLUCOSE: 44 mg/dL — AB (ref 70–99)
POTASSIUM: 3.6 meq/L — AB (ref 3.7–5.3)
Sodium: 131 mEq/L — ABNORMAL LOW (ref 137–147)

## 2014-02-18 LAB — MAGNESIUM: Magnesium: 2.1 mg/dL (ref 1.5–2.5)

## 2014-02-18 MED ORDER — DEXTROSE 50 % IV SOLN
50.0000 mL | Freq: Once | INTRAVENOUS | Status: AC | PRN
  Administered 2014-02-18: 50 mL via INTRAVENOUS

## 2014-02-18 MED ORDER — DEXTROSE 5 % IV SOLN
INTRAVENOUS | Status: DC
  Administered 2014-02-18: 08:00:00 via INTRAVENOUS

## 2014-02-18 MED ORDER — DEXTROSE 50 % IV SOLN
INTRAVENOUS | Status: AC
  Filled 2014-02-18: qty 50

## 2014-02-18 NOTE — Progress Notes (Signed)
Have been working with the MD all day regarding pt hypoglycemia.  Verbal and telephone orders were received throughout the day concerning pt blood sugar.  MD has given orders to stop D5 and saline lock pt IV at this time, but to continue to check CBGs.

## 2014-02-18 NOTE — Progress Notes (Signed)
Hypoglycemic Event  CBG: 38  Treatment: 1 tube instant glucose  Symptoms: Nervous/irritable  Follow-up CBG: Time:0810 CBG Result:47  Possible Reasons for Event: Inadequate meal intake  Comments/MD notified:MD notified that pt will not eat meals or snacks complaining they give her gas.  Pt has been educated about this several times and still refusing to follow plan of care.  MD gave orders to start on D5 solution until she can talk with the patient.      Reva BoresHolt, Lorik Guo L  Remember to initiate Hypoglycemia Order Set & complete

## 2014-02-18 NOTE — Consult Note (Signed)
Reason for Consult:Fracture tip right lateral malleolus Referring Physician: Hospitalist  [name] is an 78 y.o. female.  HPI: She normally uses a walker at home.  She was walking but the walker got away from her for some reason.  She fell and hurt her right ankle and skinned up her right elbow and hit her right shoulder.  X-rays show a very small avulsion fracture of her right lateral malleolus with no displacement.  She had no head injury.  Past Medical History  Diagnosis Date  . Paroxysmal atrial fibrillation   . PVD (peripheral vascular disease)     status post fem-fem bypass in sept/2009  . Junctional bradycardia     in the setting of hyperkalemia in the past  . GI bleeding   . Coronary artery disease   . Mitral regurgitation     status post annuloplasty ring.  . Pulmonary hypertension     moderate  . Hypertension   . Diabetes mellitus   . Renal insufficiency     in feb. 2008, acute on chronic    Past Surgical History  Procedure Laterality Date  . Coronary artery bypass graft      LIMA to the LAD, SVG to PDA, SVG to posterolateral, SVG to diagonal 2000  . Cholecystectomy    . Amputation      partial of right thumb    Family History  Problem Relation Age of Onset  . Cancer Mother     deceased  . Heart attack Father     deceased    Social History:  reports that she quit smoking about 15 years ago. Her smoking use included Cigarettes. She smoked 0.00 packs per day. She does not have any smokeless tobacco history on file. She reports that she does not drink alcohol or use illicit drugs.  Allergies:  Allergies  Allergen Reactions  . Sulfonamide Derivatives Itching    Medications: I have reviewed the patient's current medications.  Results for orders placed during the hospital encounter of 02/16/14 (from the past 48 hour(s))  CBC WITH DIFFERENTIAL     Status: Abnormal   Collection Time    02/16/14  7:30 PM      Result Value Ref Range   WBC 6.9  4.0 - 10.5  K/uL   RBC 4.39  3.87 - 5.11 MIL/uL   Hemoglobin 11.8 (*) 12.0 - 15.0 g/dL   HCT 34.7 (*) 36.0 - 46.0 %   MCV 79.0  78.0 - 100.0 fL   MCH 26.9  26.0 - 34.0 pg   MCHC 34.0  30.0 - 36.0 g/dL   RDW 14.5  11.5 - 15.5 %   Platelets 221  150 - 400 K/uL   Neutrophils Relative % 84 (*) 43 - 77 %   Neutro Abs 5.8  1.7 - 7.7 K/uL   Lymphocytes Relative 4 (*) 12 - 46 %   Lymphs Abs 0.2 (*) 0.7 - 4.0 K/uL   Monocytes Relative 10  3 - 12 %   Monocytes Absolute 0.7  0.1 - 1.0 K/uL   Eosinophils Relative 2  0 - 5 %   Eosinophils Absolute 0.2  0.0 - 0.7 K/uL   Basophils Relative 0  0 - 1 %   Basophils Absolute 0.0  0.0 - 0.1 K/uL  COMPREHENSIVE METABOLIC PANEL     Status: Abnormal   Collection Time    02/16/14  7:30 PM      Result Value Ref Range   Sodium 133 (*)  137 - 147 mEq/L   Potassium 3.5 (*) 3.7 - 5.3 mEq/L   Chloride 94 (*) 96 - 112 mEq/L   CO2 19  19 - 32 mEq/L   Glucose, Bld 50 (*) 70 - 99 mg/dL   BUN 110 (*) 6 - 23 mg/dL   Creatinine, Ser 3.24 (*) 0.50 - 1.10 mg/dL   Calcium 8.0 (*) 8.4 - 10.5 mg/dL   Total Protein 5.4 (*) 6.0 - 8.3 g/dL   Albumin 2.1 (*) 3.5 - 5.2 g/dL   AST 23  0 - 37 U/L   ALT 24  0 - 35 U/L   Alkaline Phosphatase 89  39 - 117 U/L   Total Bilirubin 0.5  0.3 - 1.2 mg/dL   GFR calc non Af Amer 13 (*) >90 mL/min   GFR calc Af Amer 15 (*) >90 mL/min   Comment: (NOTE)     The eGFR has been calculated using the CKD EPI equation.     This calculation has not been validated in all clinical situations.     eGFR's persistently <90 mL/min signify possible Chronic Kidney     Disease.   Anion gap 20 (*) 5 - 15  URINALYSIS, ROUTINE W REFLEX MICROSCOPIC     Status: Abnormal   Collection Time    02/16/14  8:45 PM      Result Value Ref Range   Color, Urine YELLOW  YELLOW   APPearance CLEAR  CLEAR   Specific Gravity, Urine 1.020  1.005 - 1.030   pH 5.0  5.0 - 8.0   Glucose, UA NEGATIVE  NEGATIVE mg/dL   Hgb urine dipstick TRACE (*) NEGATIVE   Bilirubin Urine  NEGATIVE  NEGATIVE   Ketones, ur NEGATIVE  NEGATIVE mg/dL   Protein, ur TRACE (*) NEGATIVE mg/dL   Urobilinogen, UA 0.2  0.0 - 1.0 mg/dL   Nitrite NEGATIVE  NEGATIVE   Leukocytes, UA NEGATIVE  NEGATIVE  URINE MICROSCOPIC-ADD ON     Status: Abnormal   Collection Time    02/16/14  8:45 PM      Result Value Ref Range   Squamous Epithelial / LPF FEW (*) RARE   WBC, UA 0-2  <3 WBC/hpf   RBC / HPF 0-2  <3 RBC/hpf   Bacteria, UA FEW (*) RARE   Casts HYALINE CASTS (*) NEGATIVE   Comment: GRANULAR CAST  CLOSTRIDIUM DIFFICILE BY PCR     Status: None   Collection Time    02/17/14  1:04 AM      Result Value Ref Range   C difficile by pcr NEGATIVE  NEGATIVE  COMPREHENSIVE METABOLIC PANEL     Status: Abnormal   Collection Time    02/17/14  6:25 AM      Result Value Ref Range   Sodium 134 (*) 137 - 147 mEq/L   Potassium 3.4 (*) 3.7 - 5.3 mEq/L   Chloride 96  96 - 112 mEq/L   CO2 19  19 - 32 mEq/L   Glucose, Bld 49 (*) 70 - 99 mg/dL   BUN 109 (*) 6 - 23 mg/dL   Creatinine, Ser 3.25 (*) 0.50 - 1.10 mg/dL   Calcium 7.4 (*) 8.4 - 10.5 mg/dL   Total Protein 4.7 (*) 6.0 - 8.3 g/dL   Albumin 1.8 (*) 3.5 - 5.2 g/dL   AST 34  0 - 37 U/L   ALT 28  0 - 35 U/L   Alkaline Phosphatase 137 (*) 39 - 117 U/L   Total  Bilirubin 0.8  0.3 - 1.2 mg/dL   GFR calc non Af Amer 13 (*) >90 mL/min   GFR calc Af Amer 15 (*) >90 mL/min   Comment: (NOTE)     The eGFR has been calculated using the CKD EPI equation.     This calculation has not been validated in all clinical situations.     eGFR's persistently <90 mL/min signify possible Chronic Kidney     Disease.   Anion gap 19 (*) 5 - 15  CBC     Status: Abnormal   Collection Time    02/17/14  6:25 AM      Result Value Ref Range   WBC 4.7  4.0 - 10.5 K/uL   RBC 4.13  3.87 - 5.11 MIL/uL   Hemoglobin 10.8 (*) 12.0 - 15.0 g/dL   HCT 32.9 (*) 36.0 - 46.0 %   MCV 79.7  78.0 - 100.0 fL   MCH 26.2  26.0 - 34.0 pg   MCHC 32.8  30.0 - 36.0 g/dL   RDW 14.4  11.5  - 15.5 %   Platelets 171  150 - 400 K/uL  PROTIME-INR     Status: None   Collection Time    02/17/14  6:25 AM      Result Value Ref Range   Prothrombin Time 14.2  11.6 - 15.2 seconds   INR 1.10  0.00 - 1.49  HEMOGLOBIN A1C     Status: Abnormal   Collection Time    02/17/14  6:25 AM      Result Value Ref Range   Hemoglobin A1C 11.5 (*) <5.7 %   Comment: (NOTE)                                                                               According to the ADA Clinical Practice Recommendations for 2011, when     HbA1c is used as a screening test:      >=6.5%   Diagnostic of Diabetes Mellitus               (if abnormal result is confirmed)     5.7-6.4%   Increased risk of developing Diabetes Mellitus     References:Diagnosis and Classification of Diabetes Mellitus,Diabetes     TKZS,0109,32(TFTDD 1):S62-S69 and Standards of Medical Care in             Diabetes - 2011,Diabetes Care,2011,34 (Suppl 1):S11-S61.   Mean Plasma Glucose 283 (*) <117 mg/dL   Comment: Performed at [facility], [facility]     Status: Abnormal   Collection Time    02/17/14  7:26 AM      Result Value Ref Range   Glucose-Capillary 45 (*) 70 - 99 mg/dL  GLUCOSE, CAPILLARY     Status: Abnormal   Collection Time    02/17/14  7:52 AM      Result Value Ref Range   Glucose-Capillary 57 (*) 70 - 99 mg/dL  GLUCOSE, CAPILLARY     Status: None   Collection Time    02/17/14  8:51 AM      Result Value Ref Range   Glucose-Capillary 77  70 - 99 mg/dL  GLUCOSE, CAPILLARY  Status: None   Collection Time    02/17/14 11:12 AM      Result Value Ref Range   Glucose-Capillary 94  70 - 99 mg/dL  GLUCOSE, CAPILLARY     Status: Abnormal   Collection Time    02/17/14  4:53 PM      Result Value Ref Range   Glucose-Capillary 42 (*) 70 - 99 mg/dL  GLUCOSE, CAPILLARY     Status: Abnormal   Collection Time    02/17/14  5:33 PM      Result Value Ref Range   Glucose-Capillary 62 (*) 70 - 99 mg/dL  GLUCOSE,  CAPILLARY     Status: None   Collection Time    02/17/14  6:09 PM      Result Value Ref Range   Glucose-Capillary 84  70 - 99 mg/dL  GLUCOSE, CAPILLARY     Status: None   Collection Time    02/17/14  9:19 PM      Result Value Ref Range   Glucose-Capillary 84  70 - 99 mg/dL  GLUCOSE, CAPILLARY     Status: None   Collection Time    02/17/14 11:07 PM      Result Value Ref Range   Glucose-Capillary 98  70 - 99 mg/dL  BASIC METABOLIC PANEL     Status: Abnormal   Collection Time    02/18/14  6:48 AM      Result Value Ref Range   Sodium 131 (*) 137 - 147 mEq/L   Potassium 3.6 (*) 3.7 - 5.3 mEq/L   Chloride 93 (*) 96 - 112 mEq/L   CO2 18 (*) 19 - 32 mEq/L   Glucose, Bld 44 (*) 70 - 99 mg/dL   Comment: CRITICAL RESULT CALLED TO, READ BACK BY AND VERIFIED WITH:     HOLT,J AT 7:50AM ON 02/18/14 BY FESTERMAN,C   BUN 107 (*) 6 - 23 mg/dL   Creatinine, Ser 3.28 (*) 0.50 - 1.10 mg/dL   Calcium 7.2 (*) 8.4 - 10.5 mg/dL   GFR calc non Af Amer 13 (*) >90 mL/min   GFR calc Af Amer 15 (*) >90 mL/min   Comment: (NOTE)     The eGFR has been calculated using the CKD EPI equation.     This calculation has not been validated in all clinical situations.     eGFR's persistently <90 mL/min signify possible Chronic Kidney     Disease.   Anion gap 20 (*) 5 - 15  CBC     Status: Abnormal   Collection Time    02/18/14  6:48 AM      Result Value Ref Range   WBC 3.3 (*) 4.0 - 10.5 K/uL   RBC 4.07  3.87 - 5.11 MIL/uL   Hemoglobin 11.1 (*) 12.0 - 15.0 g/dL   HCT 32.3 (*) 36.0 - 46.0 %   MCV 79.4  78.0 - 100.0 fL   MCH 27.3  26.0 - 34.0 pg   MCHC 34.4  30.0 - 36.0 g/dL   RDW 14.4  11.5 - 15.5 %   Platelets 182  150 - 400 K/uL   Comment: LARGE PLATELETS PRESENT  MAGNESIUM     Status: None   Collection Time    02/18/14  6:48 AM      Result Value Ref Range   Magnesium 2.1  1.5 - 2.5 mg/dL  GLUCOSE, CAPILLARY     Status: Abnormal   Collection Time    02/18/14  7:55 AM  Result Value Ref Range    Glucose-Capillary 38 (*) 70 - 99 mg/dL   Comment 1 Notify RN    GLUCOSE, CAPILLARY     Status: Abnormal   Collection Time    02/18/14  8:11 AM      Result Value Ref Range   Glucose-Capillary 47 (*) 70 - 99 mg/dL   Comment 1 Notify RN    GLUCOSE, CAPILLARY     Status: Abnormal   Collection Time    02/18/14  8:38 AM      Result Value Ref Range   Glucose-Capillary 191 (*) 70 - 99 mg/dL   Comment 1 Notify RN    GLUCOSE, CAPILLARY     Status: Abnormal   Collection Time    02/18/14 10:35 AM      Result Value Ref Range   Glucose-Capillary 171 (*) 70 - 99 mg/dL  GLUCOSE, CAPILLARY     Status: Abnormal   Collection Time    02/18/14 12:18 PM      Result Value Ref Range   Glucose-Capillary 136 (*) 70 - 99 mg/dL   Comment 1 Notify RN      Dg Ankle Complete Right  02/16/2014   CLINICAL DATA:  Patient fell while walking with a walker 4 days ago. Edema and erythema involving the right ankle. Initial encounter.  EXAM: RIGHT ANKLE - COMPLETE 3+ VIEW  COMPARISON:  None.  FINDINGS: Minimally displaced avulsion fracture involving the tip of the lateral malleolus. No other fractures. Ankle mortise intact with mild joint space narrowing. Generalized osseous demineralization. Small joint effusion. Small plantar calcaneal spur. Extensive calcification involving the tibioperoneal arteries and the dorsalis pedis and plantar arteries.  IMPRESSION: Minimally displaced avulsion fracture involving the tip of the lateral malleolus. No other fractures. Osseous demineralization.   Electronically Signed   By: Evangeline Dakin M.D.   On: 02/16/2014 20:38   US Renal  02/17/2014   CLINICAL DATA:  Acute renal failure, difficulty with urination  EXAM: RENAL/URINARY TRACT ULTRASOUND COMPLETE  COMPARISON:  None  FINDINGS: Examination limited by body habitus and patient inability to suspend respiration.  Right Kidney:  Length: 10.5 cm marked cortical thinning. Increased cortical echogenicity. Multiple shadowing echogenic foci  suggesting calcifications though uncertain if represent calculi or medullary calcinosis. No gross evidence of mass or hydronephrosis. No perinephric fluid.  Left Kidney:  Length: 10.2 cm. Marked cortical thinning. Increased cortical echogenicity. Hypoechoic nodule with scattered internal echogenicity at mid LEFT kidney, 1.9 x 1.9 x 2.0 cm. Additional echogenic and occasionally shadowing foci are seen within the LEFT kidney, uncertain if represent nonobstructing calculi or medullary calcinosis. No additional mass or perinephric fluid.  Bladder:  Well distended, unremarkable.  Ureteral jets not visualized.  IMPRESSION: Atrophic kidneys with medical renal disease changes bilaterally.  Echogenic and occasionally shadowing foci throughout both kidneys could represent nonobstructing calculi or medullary calcinosis.  Probable mildly complicated cyst mid LEFT kidney 2.0 cm in greatest size   Electronically Signed   By: Lavonia Dana M.D.   On: 02/17/2014 15:57   US Venous Img Lower Bilateral  02/17/2014   CLINICAL DATA:  Bilateral lower extremity pain and swelling.  EXAM: BILATERAL LOWER EXTREMITY VENOUS DOPPLER ULTRASOUND  TECHNIQUE: Gray-scale sonography with graded compression, as well as color Doppler and duplex ultrasound were performed to evaluate the lower extremity deep venous systems from the level of the common femoral vein and including the common femoral, femoral, profunda femoral, popliteal and calf veins including the posterior tibial, peroneal and  gastrocnemius veins when visible. The superficial great saphenous vein was also interrogated. Spectral Doppler was utilized to evaluate flow at rest and with distal augmentation maneuvers in the common femoral, femoral and popliteal veins.  COMPARISON:  None.  FINDINGS: RIGHT LOWER EXTREMITY  Common Femoral Vein: No evidence of thrombus. Normal compressibility, respiratory phasicity and response to augmentation.  Saphenofemoral Junction: No evidence of thrombus.  Normal compressibility and flow on color Doppler imaging.  Profunda Femoral Vein: No evidence of thrombus. Normal compressibility and flow on color Doppler imaging.  Femoral Vein: No evidence of thrombus. Normal compressibility, respiratory phasicity and response to augmentation.  Popliteal Vein: No evidence of thrombus. Normal compressibility, respiratory phasicity and response to augmentation.  Calf Veins: No evidence of thrombus. Normal compressibility and flow on color Doppler imaging.  Superficial Great Saphenous Vein: No evidence of thrombus. Normal compressibility and flow on color Doppler imaging.  Venous Reflux:  None.  Other Findings:  None.  LEFT LOWER EXTREMITY  Common Femoral Vein: No evidence of thrombus. Normal compressibility, respiratory phasicity and response to augmentation.  Saphenofemoral Junction: No evidence of thrombus. Normal compressibility and flow on color Doppler imaging.  Profunda Femoral Vein: No evidence of thrombus. Normal compressibility and flow on color Doppler imaging.  Femoral Vein: No evidence of thrombus. Normal compressibility, respiratory phasicity and response to augmentation.  Popliteal Vein: No evidence of thrombus. Normal compressibility, respiratory phasicity and response to augmentation.  Calf Veins: No evidence of thrombus. Normal compressibility and flow on color Doppler imaging.  Superficial Great Saphenous Vein: No evidence of thrombus. Normal compressibility and flow on color Doppler imaging.  Venous Reflux:  None.  Other Findings:  None.  IMPRESSION: No evidence of deep venous thrombosis seen in either lower extremity.   Electronically Signed   By: Sabino Dick M.D.   On: 02/17/2014 16:59   US Arterial Seg Single  02/17/2014   CLINICAL DATA:  Lower extremity claudication and rest pain bilaterally.  EXAM: NONINVASIVE PHYSIOLOGIC VASCULAR STUDY OF BILATERAL LOWER EXTREMITIES  TECHNIQUE: Evaluation of both lower extremities were performed at rest, including  calculation of ankle-brachial indices with single level Doppler, pressure and pulse volume recording.  COMPARISON:  None.  FINDINGS: Right ABI: Ankle pressures are markedly elevated. ABI measurements are spurious.  Left ABI: Ankle pressures are markedly elevated. ABI measurements or spurious.  Right Lower Extremity: Posterior tibial and dorsalis pedis Doppler waveforms are monophasic.  Left Lower Extremity: Posterior tibial and dorsalis pedis waveforms are monophasic.  IMPRESSION: Severe bilateral lower extremity arterial occlusive disease.   Electronically Signed   By: Maryclare Bean M.D.   On: 02/17/2014 17:02    Review of Systems  Cardiovascular:       Hypertension, Atrial Fib  Genitourinary:       Renal Failure  Musculoskeletal: Positive for falls (Fell at home and hurt the right ankle and right arm.).       History of cellulitis of the legs  Endo/Heme/Allergies:       Diabetes mellitus type 2   Blood pressure 112/64, pulse 54, temperature 97.8 F (36.6 C), temperature source Oral, resp. rate 18, height 5' 5" (1.651 m), weight 73.198 kg (161 lb 6 oz), SpO2 93.00%. Physical Exam  Constitutional: She is oriented to person, place, and time. She appears well-developed and well-nourished.  HENT:  Head: Normocephalic and atraumatic.  Eyes: Conjunctivae and EOM are normal. Pupils are equal, round, and reactive to light.  Neck: Normal range of motion.  Cardiovascular: Intact distal pulses.  Respiratory: Effort normal.  GI: Soft.  Musculoskeletal: She exhibits tenderness (Pain lateral right ankle with some swelling.  She has abrasion of the right elbow.  Motion of both full.  Motion of right shoulder  is full.  NV is intact.).  Neurological: She is alert and oriented to person, place, and time. She has normal reflexes.  Skin: Skin is warm and dry. There is erythema (some redness of the distal legs consistent with chronic cellulitis.).  Psychiatric: She has a normal mood and affect. Her behavior is  normal. Judgment and thought content normal.    Assessment/Plan: Small avulsion fracture nondisplaced of right lateral malleolus. Chronic cellulitis of the lower legs.  She will need an Armed forces training and education officer when out of bed.  I have ordered.  I can see in office after discharge. She may weight bear as tolerated with the Air Cast on.  Loyed Wilmes 02/18/2014, 2:09 PM

## 2014-02-18 NOTE — Progress Notes (Signed)
[facility] PROGRESS NOTE  [name] XLK:[id number] DOB: [birth year] DOA: 02/16/2014 PCP: Josue Hector, MD  Assessment/Plan: Acute on CKD Stage IV -Likely prerenal from GI losses. -Cr has plateaued. -Will recheck renal function in am. -Last known creatinine was 1.98 in February of 2012. I suspect this may be a new baseline for her.  Right Malleolus Fx -Appreciate Ortho recommendations for an air cast when out of bed. Note that she can weight-bear as tolerated. -Request PT/OT evaluation.  Bilateral LE Cellulitis -Continue IV vancomycin and Zosyn for one more day and consider transitioning tomorrow.  -Await arterial/venous dopplers. -Venous Dopplers without evidence for DVT or Baker's cyst.  Hypoglycemia/DM-II -Was placed on a D5 drip this morning due to continued hypoglycemia. -Dextrose was discontinued this afternoon as her sugars have trended up. -We'll continue to monitor closely throughout the night. -DC SSI.  Diarrhea -C Diff negative. -Likely related to gout meds.  Code Status: Full Code Family Communication: patient only  Disposition Plan: To be determined   Consultants:  None   Antibiotics:  Vanc  Zosyn   Subjective: No appetite.   Objective: Filed Vitals:   02/17/14 1327 02/17/14 2057 02/18/14 0556 02/18/14 1500  BP: 104/69 119/80 112/64 112/81  Pulse: 56 80 54 37  Temp: 98.2 F (36.8 C) 98 F (36.7 C) 97.8 F (36.6 C) 97.6 F (36.4 C)  TempSrc: Oral Oral Oral Oral  Resp: 20 20 18 20   Height:      Weight:   73.198 kg (161 lb 6 oz)   SpO2: 97% 100% 93% 100%    Intake/Output Summary (Last 24 hours) at 02/18/14 1814 Last data filed at 02/18/14 1300  Gross per 24 hour  Intake   1640 ml  Output      0 ml  Net   1640 ml   Filed Weights   02/17/14 0117 02/17/14 0505 02/18/14 0556  Weight: 73 kg (160 lb 15 oz) 73 kg (160 lb 15 oz) 73.198 kg (161 lb 6 oz)    Exam:   General:  AA Ox3  Cardiovascular:  RRR  Respiratory: CTA B  Abdomen: S/NT/+BS  Extremities: trace edema lower extremity wounds are dressed and clean dressings.  Neurologic:  Moves all 4 spontaneously.  Data Reviewed: Basic Metabolic Panel:  Recent Labs Lab 02/16/14 1930 02/17/14 0625 02/18/14 0648  NA 133* 134* 131*  K 3.5* 3.4* 3.6*  CL 94* 96 93*  CO2 19 19 18*  GLUCOSE 50* 49* 44*  BUN 110* 109* 107*  CREATININE 3.24* 3.25* 3.28*  CALCIUM 8.0* 7.4* 7.2*  MG  --   --  2.1   Liver Function Tests:  Recent Labs Lab 02/16/14 1930 02/17/14 0625  AST 23 34  ALT 24 28  ALKPHOS 89 137*  BILITOT 0.5 0.8  PROT 5.4* 4.7*  ALBUMIN 2.1* 1.8*   No results found for this basename: LIPASE, AMYLASE,  in the last 168 hours No results found for this basename: AMMONIA,  in the last 168 hours CBC:  Recent Labs Lab 02/16/14 1930 02/17/14 0625 02/18/14 0648  WBC 6.9 4.7 3.3*  NEUTROABS 5.8  --   --   HGB 11.8* 10.8* 11.1*  HCT 34.7* 32.9* 32.3*  MCV 79.0 79.7 79.4  PLT 221 171 182   Cardiac Enzymes: No results found for this basename: CKTOTAL, CKMB, CKMBINDEX, TROPONINI,  in the last 168 hours BNP (last 3 results) No results found for this basename: PROBNP,  in the last 8760 hours CBG:  Recent Labs Lab 02/18/14 0811 02/18/14 0838 02/18/14 1035 02/18/14 1218 02/18/14 1626  GLUCAP 47* 191* 171* 136* 184*    Recent Results (from the past 240 hour(s))  CLOSTRIDIUM DIFFICILE BY PCR     Status: None   Collection Time    02/17/14  1:04 AM      Result Value Ref Range Status   C difficile by pcr NEGATIVE  NEGATIVE Final     Studies: Dg Ankle Complete Right  02/16/2014   CLINICAL DATA:  Patient fell while walking with a walker 4 days ago. Edema and erythema involving the right ankle. Initial encounter.  EXAM: RIGHT ANKLE - COMPLETE 3+ VIEW  COMPARISON:  None.  FINDINGS: Minimally displaced avulsion fracture involving the tip of the lateral malleolus. No other fractures. Ankle mortise intact with  mild joint space narrowing. Generalized osseous demineralization. Small joint effusion. Small plantar calcaneal spur. Extensive calcification involving the tibioperoneal arteries and the dorsalis pedis and plantar arteries.  IMPRESSION: Minimally displaced avulsion fracture involving the tip of the lateral malleolus. No other fractures. Osseous demineralization.   Electronically Signed   By: Hulan Saas M.D.   On: 02/16/2014 20:38   US Renal  02/17/2014   CLINICAL DATA:  Acute renal failure, difficulty with urination  EXAM: RENAL/URINARY TRACT ULTRASOUND COMPLETE  COMPARISON:  None  FINDINGS: Examination limited by body habitus and patient inability to suspend respiration.  Right Kidney:  Length: 10.5 cm marked cortical thinning. Increased cortical echogenicity. Multiple shadowing echogenic foci suggesting calcifications though uncertain if represent calculi or medullary calcinosis. No gross evidence of mass or hydronephrosis. No perinephric fluid.  Left Kidney:  Length: 10.2 cm. Marked cortical thinning. Increased cortical echogenicity. Hypoechoic nodule with scattered internal echogenicity at mid LEFT kidney, 1.9 x 1.9 x 2.0 cm. Additional echogenic and occasionally shadowing foci are seen within the LEFT kidney, uncertain if represent nonobstructing calculi or medullary calcinosis. No additional mass or perinephric fluid.  Bladder:  Well distended, unremarkable.  Ureteral jets not visualized.  IMPRESSION: Atrophic kidneys with medical renal disease changes bilaterally.  Echogenic and occasionally shadowing foci throughout both kidneys could represent nonobstructing calculi or medullary calcinosis.  Probable mildly complicated cyst mid LEFT kidney 2.0 cm in greatest size   Electronically Signed   By: Ulyses Southward M.D.   On: 02/17/2014 15:57   US Venous Img Lower Bilateral  02/17/2014   CLINICAL DATA:  Bilateral lower extremity pain and swelling.  EXAM: BILATERAL LOWER EXTREMITY VENOUS DOPPLER ULTRASOUND   TECHNIQUE: Gray-scale sonography with graded compression, as well as color Doppler and duplex ultrasound were performed to evaluate the lower extremity deep venous systems from the level of the common femoral vein and including the common femoral, femoral, profunda femoral, popliteal and calf veins including the posterior tibial, peroneal and gastrocnemius veins when visible. The superficial great saphenous vein was also interrogated. Spectral Doppler was utilized to evaluate flow at rest and with distal augmentation maneuvers in the common femoral, femoral and popliteal veins.  COMPARISON:  None.  FINDINGS: RIGHT LOWER EXTREMITY  Common Femoral Vein: No evidence of thrombus. Normal compressibility, respiratory phasicity and response to augmentation.  Saphenofemoral Junction: No evidence of thrombus. Normal compressibility and flow on color Doppler imaging.  Profunda Femoral Vein: No evidence of thrombus. Normal compressibility and flow on color Doppler imaging.  Femoral Vein: No evidence of thrombus. Normal compressibility, respiratory phasicity and response to augmentation.  Popliteal Vein: No evidence of thrombus. Normal compressibility, respiratory phasicity and response to augmentation.  Calf Veins: No evidence of thrombus. Normal compressibility and flow on color Doppler imaging.  Superficial Great Saphenous Vein: No evidence of thrombus. Normal compressibility and flow on color Doppler imaging.  Venous Reflux:  None.  Other Findings:  None.  LEFT LOWER EXTREMITY  Common Femoral Vein: No evidence of thrombus. Normal compressibility, respiratory phasicity and response to augmentation.  Saphenofemoral Junction: No evidence of thrombus. Normal compressibility and flow on color Doppler imaging.  Profunda Femoral Vein: No evidence of thrombus. Normal compressibility and flow on color Doppler imaging.  Femoral Vein: No evidence of thrombus. Normal compressibility, respiratory phasicity and response to augmentation.   Popliteal Vein: No evidence of thrombus. Normal compressibility, respiratory phasicity and response to augmentation.  Calf Veins: No evidence of thrombus. Normal compressibility and flow on color Doppler imaging.  Superficial Great Saphenous Vein: No evidence of thrombus. Normal compressibility and flow on color Doppler imaging.  Venous Reflux:  None.  Other Findings:  None.  IMPRESSION: No evidence of deep venous thrombosis seen in either lower extremity.   Electronically Signed   By: Roque LiasJames  Green M.D.   On: 02/17/2014 16:59   [place] Arterial Seg Single  02/17/2014   CLINICAL DATA:  Lower extremity claudication and rest pain bilaterally.  EXAM: NONINVASIVE PHYSIOLOGIC VASCULAR STUDY OF BILATERAL LOWER EXTREMITIES  TECHNIQUE: Evaluation of both lower extremities were performed at rest, including calculation of ankle-brachial indices with single level Doppler, pressure and pulse volume recording.  COMPARISON:  None.  FINDINGS: Right ABI: Ankle pressures are markedly elevated. ABI measurements are spurious.  Left ABI: Ankle pressures are markedly elevated. ABI measurements or spurious.  Right Lower Extremity: Posterior tibial and dorsalis pedis Doppler waveforms are monophasic.  Left Lower Extremity: Posterior tibial and dorsalis pedis waveforms are monophasic.  IMPRESSION: Severe bilateral lower extremity arterial occlusive disease.   Electronically Signed   By: Maryclare BeanArt  Hoss M.D.   On: 02/17/2014 17:02    Scheduled Meds: . amLODipine  10 mg Oral q morning - 10a  . collagenase   Topical Daily  . heparin  5,000 Units Subcutaneous 3 times per day  . piperacillin-tazobactam (ZOSYN)  IV  2.25 g Intravenous Q8H  . saccharomyces boulardii  250 mg Oral BID  . sodium chloride  3 mL Intravenous Q12H  . vancomycin  1,000 mg Intravenous Q48H   Continuous Infusions:    Principal Problem:   Acute on chronic renal failure Active Problems:   ATRIAL FIBRILLATION, PAROXYSMAL   Peripheral vascular disease   Acute  diarrhea   DM type 2 causing renal disease   CKD (chronic kidney disease) stage 4, GFR 15-29 ml/min   Fracture of right ankle, lateral malleolus   ARF (acute renal failure)   Bilateral cellulitis of lower leg    Time spent: 30 minutes. Greater than 50% of this time was spent in direct contact with the patient coordinating care.    Chaya JanHERNANDEZ ACOSTA,ESTELA  [facility] Pager [phone]  If 7PM-7AM, please contact night-coverage at [email or web address], password [email or web address] 02/18/2014, 6:14 PM  LOS: 2 days

## 2014-02-19 LAB — GLUCOSE, CAPILLARY
GLUCOSE-CAPILLARY: 195 mg/dL — AB (ref 70–99)
GLUCOSE-CAPILLARY: 162 mg/dL — AB (ref 70–99)
Glucose-Capillary: 146 mg/dL — ABNORMAL HIGH (ref 70–99)
Glucose-Capillary: 138 mg/dL — ABNORMAL HIGH (ref 70–99)

## 2014-02-19 LAB — CBC
HEMATOCRIT: 31.1 % — AB (ref 36.0–46.0)
Hemoglobin: 10.8 g/dL — ABNORMAL LOW (ref 12.0–15.0)
MCH: 27.3 pg (ref 26.0–34.0)
MCHC: 34.7 g/dL (ref 30.0–36.0)
MCV: 78.7 fL (ref 78.0–100.0)
PLATELETS: 169 10*3/uL (ref 150–400)
RBC: 3.95 MIL/uL (ref 3.87–5.11)
RDW: 14.2 % (ref 11.5–15.5)
WBC: 3.2 10*3/uL — ABNORMAL LOW (ref 4.0–10.5)

## 2014-02-19 LAB — BASIC METABOLIC PANEL
Anion gap: 17 — ABNORMAL HIGH (ref 5–15)
BUN: 109 mg/dL — AB (ref 6–23)
CALCIUM: 7 mg/dL — AB (ref 8.4–10.5)
CO2: 19 meq/L (ref 19–32)
CREATININE: 3.53 mg/dL — AB (ref 0.50–1.10)
Chloride: 90 mEq/L — ABNORMAL LOW (ref 96–112)
GFR calc Af Amer: 13 mL/min — ABNORMAL LOW (ref >90)
GFR, EST NON AFRICAN AMERICAN: 12 mL/min — AB (ref >90)
GLUCOSE: 128 mg/dL — AB (ref 70–99)
Potassium: 4 mEq/L (ref 3.7–5.3)
Sodium: 126 mEq/L — ABNORMAL LOW (ref 137–147)

## 2014-02-19 MED ORDER — ALBUTEROL SULFATE (2.5 MG/3ML) 0.083% IN NEBU: 2.5000 mg | INHALATION_SOLUTION | Freq: Four times a day (QID) | RESPIRATORY_TRACT | Status: DC | PRN

## 2014-02-19 MED ORDER — DOXYCYCLINE HYCLATE 100 MG PO TABS
100.0000 mg | ORAL_TABLET | Freq: Two times a day (BID) | ORAL | Status: DC
  Administered 2014-02-19 – 2014-02-20 (×2): 100 mg via ORAL
  Filled 2014-02-19 (×2): qty 1

## 2014-02-19 NOTE — Evaluation (Signed)
Occupational Therapy Evaluation Patient Details Name: [name] MRN: [id number] DOB: Jan 15, 1936 Today's Date: 02/19/2014    History of Present Illness Sierra PanderLorine E Rinn is a 78 y.o. female with a past medical history of coronary artery disease, status post CABG, chronic kidney disease, gout, diabetes mellitus, peripheral artery disease, who is a very poor historian. She presented to the hospital mainly complaining of a pain in both her legs, specially, the right one because she twisted her ankle a few days ago. And she also mentioned, that she's been making less urine for the last few days. She's unable to really elaborate on any of her symptoms. She however, does tell me that she was started on medications for gout about 2-3 weeks ago. This resulted in onset of severe diarrhea. She stopped taking her medicine a few days ago, but the diarrhea has persisted. She denies any abdominal pain. Has had nausea, but no vomiting. No passing out spells. She's had innumerable amounts of loose stool. Denies any blood in the stool. No fever. No chills. She was initially very reluctant to stay in the hospital, but now is agreeable. History is very limited.   She normally uses a walker at home. She was walking but the walker got away from her for some reason. She fell and hurt her right ankle and skinned up her right elbow and hit her right shoulder. X-rays show a very small avulsion fracture of her right lateral malleolus with no displacement. She had no head injury.  Orders for WBAT on RLE using air cast when OOB.     Clinical Impression   Pt is presenting to acute OT with above situation.  She has generalized weakness in BUE.  She has limited LE ADL engagement due to R ankle fx, increased pain, and WBAT status.  Pt will benefit from continued OT services to improve UE strength and assist pt in returning to PLOF (independence).  Recommend HHOT at this time, and 24-hour assist from family after initial d/d home.  Pt is  currently not using shower due to fear of falling - recommend shower seat to increase safety at home.    Follow Up Recommendations  Home health OT;Supervision/Assistance - 24 hour    Equipment Recommendations  Tub/shower seat    Recommendations for Other Services       Precautions / Restrictions Precautions Precautions: Fall Restrictions Weight Bearing Restrictions: Yes RLE Weight Bearing: Weight bearing as tolerated      Mobility Bed Mobility Overal bed mobility: Needs Assistance Bed Mobility: Supine to Sit;Sit to Supine     Supine to sit: Min assist Sit to supine: Min assist      Transfers                      Balance                                            ADL Overall ADL's : Needs assistance/impaired Eating/Feeding: Modified independent   Grooming: Modified independent               Lower Body Dressing: Minimal assistance;Moderate assistance                 General ADL Comments: Pt will require increased assist at this time for all lower body ADLs     Vision  Perception     Praxis      Pertinent Vitals/Pain Pain Assessment: Faces Faces Pain Scale: Hurts a little bit Pain Location: R ankle Pain Descriptors / Indicators: Aching;Throbbing Pain Intervention(s): Limited activity within patient's tolerance;Monitored during session;Premedicated before session     Hand Dominance Right   Extremity/Trunk Assessment Upper Extremity Assessment Upper Extremity Assessment: Generalized weakness   Lower Extremity Assessment Lower Extremity Assessment: Defer to PT evaluation       Communication Communication Communication: No difficulties   Cognition Arousal/Alertness: Awake/alert Behavior During Therapy: WFL for tasks assessed/performed Overall Cognitive Status: Within Functional Limits for tasks assessed                     General Comments       Exercises        Shoulder Instructions      Home Living Family/patient expects to be discharged to:: Private residence Living Arrangements: Alone Available Help at Discharge: Family;Available 24 hours/day Type of Home: Mobile home Home Access: Stairs to enter Entrance Stairs-Number of Steps: 4 Entrance Stairs-Rails: Can reach both Home Layout: One level     Bathroom Shower/Tub: Walk-in shower (Pt only sponge bathes)   Bathroom Toilet: Standard     Home Equipment: Bedside commode;Grab bars - toilet;Toilet riser;Walker - 4 wheels;Cane - single point Arts administrator(Lift Chair)          Prior Functioning/Environment Level of Independence: Independent with assistive device(s)             OT Diagnosis: Generalized weakness;Acute pain   OT Problem List: Decreased strength;Pain;Decreased safety awareness   OT Treatment/Interventions: Self-care/ADL training;Therapeutic exercise;Energy conservation;DME and/or AE instruction;Therapeutic activities;Patient/family education    OT Goals(Current goals can be found in the care plan section) Acute Rehab OT Goals Patient Stated Goal: No OT goals stated OT Goal Formulation: With patient Time For Goal Achievement: 03/05/14 Potential to Achieve Goals: Good ADL Goals Pt Will Transfer to Toilet: with min guard assist Pt/caregiver will Perform Home Exercise Program: Increased strength;Both right and left upper extremity  OT Frequency: Min 2X/week   Barriers to D/C:            Co-evaluation              End of Session    Activity Tolerance: Patient tolerated treatment well Patient left: in bed;with call bell/phone within reach;with bed alarm set   Time: [id number] OT Time Calculation (min): 23 min Charges:  OT General Charges $OT Visit: 1 Procedure OT Evaluation $Initial OT Evaluation Tier I: 1 Procedure G-Codes:    Marry GuanMarie Rawlings Rasheen Bells, MS, OTR/L [facility] [phone] 02/19/2014, 11:53 AM

## 2014-02-19 NOTE — Progress Notes (Signed)
Dressings to right arm and bilateral lower extremities changed as ordered. Patient complains of mild pain and requested tylenol for pain. Family at bedside.

## 2014-02-19 NOTE — Evaluation (Signed)
Physical Therapy Evaluation Patient Details Name: KRISTYANNA BARCELO MRN: [id number] DOB: Sep 25, 1935 Today's Date: 02/19/2014   History of Present Illness  DEAUNDRA DUPRIEST is a 78 y.o. female with a past medical history of coronary artery disease, status post CABG, chronic kidney disease, gout, diabetes mellitus, peripheral artery disease, who is a very poor historian. She presented to the hospital mainly complaining of a pain in both her legs, specially, the right one because she twisted her ankle a few days ago. And she also mentioned, that she's been making less urine for the last few days. She's unable to really elaborate on any of her symptoms. She however, does tell me that she was started on medications for gout about 2-3 weeks ago. This resulted in onset of severe diarrhea. She stopped taking her medicine a few days ago, but the diarrhea has persisted. She denies any abdominal pain. Has had nausea, but no vomiting. No passing out spells. She's had innumerable amounts of loose stool. Denies any blood in the stool. No fever. No chills. She was initially very reluctant to stay in the hospital, but now is agreeable. History is very limited.   She normally uses a walker at home. She was walking but the walker got away from her for some reason. She fell and hurt her right ankle and skinned up her right elbow and hit her right shoulder. X-rays show a very small avulsion fracture of her right lateral malleolus with no displacement. She had no head injury.  Orders for WBAT on RLE using air cast when OOB.    Clinical Impression   Pt was seen for evaluation.  She is a very pleasant lady who appears to be fully oriented.  She lives alone with family next door.  From her report they are available at all times.  She normally uses a walker or cane for gait and her gait is usually somewhat limited due to poor endurance.  She is found to have generalized weakness and has significant erythema over both calves.  Per RN,  there are opened blisters over both distal calves that are covered with gauze.  An air splint was fitted to pt's right ankle and she found it to be very comfortable.  We will need to provide her with a post op shoe as she will not be able to wear a street shoe with the splint on.  Pt has significant difficulty standing from sitting and normally sits in a "lift chair" at home.  She would benefit from the use of a w/c at home to assist in her mobility and we will also request HHPT for generalized strengthening.  Pt is agreeable to this.    Follow Up Recommendations Home health PT    Equipment Recommendations  Wheelchair (measurements PT)    Recommendations for Other Services  OT     Precautions / Restrictions Precautions Precautions: Fall Required Braces or Orthoses: Other Brace/Splint Other Brace/Splint: air splint on the right foot whenever weight bearing Restrictions Weight Bearing Restrictions: No RLE Weight Bearing: Weight bearing as tolerated      Mobility  Bed Mobility Overal bed mobility: Needs Assistance Bed Mobility: Supine to Sit     Supine to sit: Min assist Sit to supine: Min assist      Transfers Overall transfer level: Needs assistance Equipment used: Rolling walker (2 wheeled) Transfers: Sit to/from Stand Sit to Stand: +2 physical assistance         General transfer comment: pt normally sits in  a "lift chair" and states that she usually needs assist to rise from a seated position  Ambulation/Gait Ambulation/Gait assistance: Supervision Ambulation Distance (Feet): 20 Feet Assistive device: Rolling walker (2 wheeled) Gait Pattern/deviations: WFL(Within Functional Limits)   Gait velocity interpretation: Below normal speed for age/gender    Stairs            Wheelchair Mobility    Modified Rankin (Stroke Patients Only)       Balance Overall balance assessment: History of Falls                                            Pertinent Vitals/Pain Pain Assessment: No/denies pain Faces Pain Scale: Hurts a little bit Pain Location: R ankle Pain Descriptors / Indicators: Aching;Throbbing Pain Intervention(s): Limited activity within patient's tolerance;Monitored during session;Premedicated before session    [place] expects to be discharged to:: Private residence Living Arrangements: Alone Available Help at Discharge: Family;Available 24 hours/day Type of Home: Mobile home Home Access: Stairs to enter Entrance Stairs-Rails: Can reach both Entrance Stairs-Number of Steps: 4 Home Layout: One level Home Equipment: Bedside commode;Grab bars - toilet;Toilet riser;Walker - 4 wheels;Cane - single point      Prior Function Level of Independence: Independent with assistive device(s)               Hand Dominance   Dominant Hand: Right    Extremity/Trunk Assessment   Upper Extremity Assessment: Generalized weakness           Lower Extremity Assessment: Generalized weakness;RLE deficits/detail RLE Deficits / Details: pt has a fracture of the right lateral malleolus...she does have full  active ROM    Cervical / Trunk Assessment: Kyphotic  Communication   Communication: No difficulties  Cognition Arousal/Alertness: Awake/alert Behavior During Therapy: WFL for tasks assessed/performed Overall Cognitive Status: Within Functional Limits for tasks assessed                      General Comments      Exercises        Assessment/Plan    PT Assessment All further PT needs can be met in the next venue of care  PT Diagnosis Difficulty walking;Generalized weakness   PT Problem List Decreased strength;Decreased activity tolerance;Decreased mobility;Obesity  PT Treatment Interventions     PT Goals (Current goals can be found in the Care Plan section) Acute Rehab PT Goals Patient Stated Goal: No OT goals stated PT Goal Formulation: No goals set, d/c therapy     Frequency     Barriers to discharge        Co-evaluation               End of Session Equipment Utilized During Treatment: Gait belt;Other (comment) (air splint right) Activity Tolerance: Patient tolerated treatment well Patient left: in chair;with call bell/phone within reach;with chair alarm set Nurse Communication: Mobility status         Time: 3545-6256 PT Time Calculation (min): 39 min   Charges:   PT Evaluation $Initial PT Evaluation Tier I: 1 Procedure     PT G CodesDemetrios Isaacs L 02/19/2014, 1:34 PM

## 2014-02-19 NOTE — Progress Notes (Signed)
Patient up to chair. Tolerating well. 

## 2014-02-19 NOTE — Progress Notes (Addendum)
Spoke with patient about diabetes and home regimen for diabetes control. Patient reports that she is followed by her PCP (Dr. Denzil MagnusonNylan) for diabetes management and she states she takes Glipizide 10 mg twice a day as an outpatient for diabetes control. Noted home med list states pt takes Glipizide once a day. Inquired about blood glucose control at home. Patient reports that she does not check her glucose every day but she does check at least every other day and her glucose runs from 100's to 200's mg/dl. According to the patient she does get symptomatic with hypoglycemia ("gets shaky, dizzy, and blurry vision) and felt these symptoms yesterday when her glucose was low. However, patient reports that her glucose is rarely low at home. Patient states that she lives alone and she always keeps come candy close by in case she were to need it. However, she states that she does not eat the candy unless she were to have a low and reports that she tries to follow a diabetic diet. Discussed A1C results (11.5% on 02/17/14) and explained what an A1C is, basic pathophysiology of DM Type 2, basic home care, importance of checking CBGs and maintaining good CBG control to prevent long-term and short-term complications. Discussed impact of nutrition, exercise, stress, sickness, and medications on diabetes control.  Encouraged patient to check her glucose at least 2 times per day so that she can take her glucometer with her to her follow up appointment and her doctor can made any changes with her medications to keep her glucose better controlled.  Patient verbalized understanding of information discussed and she states that she has no further questions at this time related to diabetes.   Noted CBGs are ordered ACHS. May want to consider ordering Novolog sensitive correction scale.  Thanks, Orlando PennerMarie Deberah Adolf, RN, MSN, CCRN Diabetes Coordinator Inpatient Diabetes Program [phone] (Team Pager) [phone] ([facility] office) [phone] [facility]  office)

## 2014-02-19 NOTE — Care Management Note (Unsigned)
    Page 1 of 1   02/19/2014     5:47:16 PM CARE MANAGEMENT NOTE 02/19/2014  Patient:  Charlynne PanderWOODS,[place] E   Account Number:  [id number]  Date Initiated:  02/19/2014  Documentation initiated by:  Anibal HendersonBOLDEN,Arriona Prest  Subjective/Objective Assessment:   admitted with acute on chronic kidney disease, Bil LE cellulitis, mild, and a Fx ankle. Pt is from home, alone, but has multiple family members living all around her     Action/Plan:   She says she will be fine at home and has arranged family taking turns staying.  She agrees to have HH come, especially wants PT to assist with ambulation. She has a walker   Anticipated DC Date:  02/20/2014   Anticipated DC Plan:  HOME W HOME HEALTH SERVICES      DC Planning Services  CM consult      [facility]   Choice offered to / List presented to:  C-1 Patient        HH arranged  HH-2 PT  HH-6 SOCIAL WORKER      HH agency  [facility]   Status of service:  In process, will continue to follow Medicare Important Message given?  YES (If response is "NO", the following Medicare IM given date fields will be blank) Date Medicare IM given:  02/19/2014 Medicare IM given by:  Anibal HendersonBOLDEN,Dewain Platz Date Additional Medicare IM given:   Additional Medicare IM given by:    Discharge Disposition:  HOME W HOME HEALTH SERVICES  Per UR Regulation:  Reviewed for med. necessity/level of care/duration of stay  If discussed at Long Length of Stay Meetings, dates discussed:    Comments:  02/19/14 1745 Anibal HendersonGeneva Lazarus [place] RN/CM

## 2014-02-19 NOTE — Progress Notes (Signed)
[facility] PROGRESS NOTE  [name] ZOX:[id number] DOB: 31-Aug-1935 DOA: 02/16/2014 PCP: Josue HectorNYLAND,LEONARD ROBERT, MD  Assessment/Plan: Acute on CKD Stage IV -Acute component likely prerenal from GI losses. -Cr has plateaued. -Last known creatinine was 1.98 in February of 2012. I suspect this may be a new baseline for her.  Right Malleolus Fx -Appreciate Ortho recommendations for an air cast when out of bed. Note that she can weight-bear as tolerated. -Request PT/OT evaluation.  Bilateral LE Cellulitis -Will transition to PO doxy today. -Venous Dopplers without evidence for DVT or Baker's cyst. -Continue local wound care.  Hypoglycemia/DM-II -Is currently maintaining CBGs without need for a dextrose drip. -All insulin remains on hold for today.  Diarrhea -C Diff negative. -Likely related to gout meds.  Code Status: Full Code Family Communication: Patient's grandson and his wife in the room updated on plan of care Disposition Plan: Likely home in 24-48 hours   Consultants:  None   Antibiotics:  Doxycycline   Subjective: No appetite.   Objective: Filed Vitals:   02/18/14 1500 02/18/14 2023 02/19/14 0548 02/19/14 1401  BP: 112/81 110/72 113/66 94/71  Pulse: 37 68 65 40  Temp: 97.6 F (36.4 C) 97.8 F (36.6 C) 97.7 F (36.5 C) 97.6 F (36.4 C)  TempSrc: Oral Oral Oral Oral  Resp: 20 20 20 20   Height:      Weight:   74.1 kg (163 lb 5.8 oz)   SpO2: 100% 97% 99% 98%    Intake/Output Summary (Last 24 hours) at 02/19/14 1406 Last data filed at 02/19/14 0635  Gross per 24 hour  Intake    150 ml  Output    150 ml  Net      0 ml   Filed Weights   02/17/14 0505 02/18/14 0556 02/19/14 0548  Weight: 73 kg (160 lb 15 oz) 73.198 kg (161 lb 6 oz) 74.1 kg (163 lb 5.8 oz)    Exam:   General:  AA Ox3  Cardiovascular: RRR  Respiratory: CTA B  Abdomen: S/NT/+BS  Extremities: trace edema lower extremity wounds are dressed and clean  dressings.  Neurologic:  Moves all 4 spontaneously.  Data Reviewed: Basic Metabolic Panel:  Recent Labs Lab 02/16/14 1930 02/17/14 0625 02/18/14 0648 02/19/14 0626  NA 133* 134* 131* 126*  K 3.5* 3.4* 3.6* 4.0  CL 94* 96 93* 90*  CO2 19 19 18* 19  GLUCOSE 50* 49* 44* 128*  BUN 110* 109* 107* 109*  CREATININE 3.24* 3.25* 3.28* 3.53*  CALCIUM 8.0* 7.4* 7.2* 7.0*  MG  --   --  2.1  --    Liver Function Tests:  Recent Labs Lab 02/16/14 1930 02/17/14 0625  AST 23 34  ALT 24 28  ALKPHOS 89 137*  BILITOT 0.5 0.8  PROT 5.4* 4.7*  ALBUMIN 2.1* 1.8*   No results found for this basename: LIPASE, AMYLASE,  in the last 168 hours No results found for this basename: AMMONIA,  in the last 168 hours CBC:  Recent Labs Lab 02/16/14 1930 02/17/14 0625 02/18/14 0648 02/19/14 0626  WBC 6.9 4.7 3.3* 3.2*  NEUTROABS 5.8  --   --   --   HGB 11.8* 10.8* 11.1* 10.8*  HCT 34.7* 32.9* 32.3* 31.1*  MCV 79.0 79.7 79.4 78.7  PLT 221 171 182 169   Cardiac Enzymes: No results found for this basename: CKTOTAL, CKMB, CKMBINDEX, TROPONINI,  in the last 168 hours BNP (last 3 results) No results found for this basename:  PROBNP,  in the last 8760 hours CBG:  Recent Labs Lab 02/18/14 1218 02/18/14 1626 02/18/14 2040 02/19/14 0740 02/19/14 1149  GLUCAP 136* 184* 164* 146* 138*    Recent Results (from the past 240 hour(s))  CLOSTRIDIUM DIFFICILE BY PCR     Status: None   Collection Time    02/17/14  1:04 AM      Result Value Ref Range Status   C difficile by pcr NEGATIVE  NEGATIVE Final     Studies: US Renal  02/17/2014   CLINICAL DATA:  Acute renal failure, difficulty with urination  EXAM: RENAL/URINARY TRACT ULTRASOUND COMPLETE  COMPARISON:  None  FINDINGS: Examination limited by body habitus and patient inability to suspend respiration.  Right Kidney:  Length: 10.5 cm marked cortical thinning. Increased cortical echogenicity. Multiple shadowing echogenic foci suggesting  calcifications though uncertain if represent calculi or medullary calcinosis. No gross evidence of mass or hydronephrosis. No perinephric fluid.  Left Kidney:  Length: 10.2 cm. Marked cortical thinning. Increased cortical echogenicity. Hypoechoic nodule with scattered internal echogenicity at mid LEFT kidney, 1.9 x 1.9 x 2.0 cm. Additional echogenic and occasionally shadowing foci are seen within the LEFT kidney, uncertain if represent nonobstructing calculi or medullary calcinosis. No additional mass or perinephric fluid.  Bladder:  Well distended, unremarkable.  Ureteral jets not visualized.  IMPRESSION: Atrophic kidneys with medical renal disease changes bilaterally.  Echogenic and occasionally shadowing foci throughout both kidneys could represent nonobstructing calculi or medullary calcinosis.  Probable mildly complicated cyst mid LEFT kidney 2.0 cm in greatest size   Electronically Signed   By: Ulyses Southward M.D.   On: 02/17/2014 15:57   US Venous Img Lower Bilateral  02/17/2014   CLINICAL DATA:  Bilateral lower extremity pain and swelling.  EXAM: BILATERAL LOWER EXTREMITY VENOUS DOPPLER ULTRASOUND  TECHNIQUE: Gray-scale sonography with graded compression, as well as color Doppler and duplex ultrasound were performed to evaluate the lower extremity deep venous systems from the level of the common femoral vein and including the common femoral, femoral, profunda femoral, popliteal and calf veins including the posterior tibial, peroneal and gastrocnemius veins when visible. The superficial great saphenous vein was also interrogated. Spectral Doppler was utilized to evaluate flow at rest and with distal augmentation maneuvers in the common femoral, femoral and popliteal veins.  COMPARISON:  None.  FINDINGS: RIGHT LOWER EXTREMITY  Common Femoral Vein: No evidence of thrombus. Normal compressibility, respiratory phasicity and response to augmentation.  Saphenofemoral Junction: No evidence of thrombus. Normal  compressibility and flow on color Doppler imaging.  Profunda Femoral Vein: No evidence of thrombus. Normal compressibility and flow on color Doppler imaging.  Femoral Vein: No evidence of thrombus. Normal compressibility, respiratory phasicity and response to augmentation.  Popliteal Vein: No evidence of thrombus. Normal compressibility, respiratory phasicity and response to augmentation.  Calf Veins: No evidence of thrombus. Normal compressibility and flow on color Doppler imaging.  Superficial Great Saphenous Vein: No evidence of thrombus. Normal compressibility and flow on color Doppler imaging.  Venous Reflux:  None.  Other Findings:  None.  LEFT LOWER EXTREMITY  Common Femoral Vein: No evidence of thrombus. Normal compressibility, respiratory phasicity and response to augmentation.  Saphenofemoral Junction: No evidence of thrombus. Normal compressibility and flow on color Doppler imaging.  Profunda Femoral Vein: No evidence of thrombus. Normal compressibility and flow on color Doppler imaging.  Femoral Vein: No evidence of thrombus. Normal compressibility, respiratory phasicity and response to augmentation.  Popliteal Vein: No evidence of thrombus. Normal compressibility,  respiratory phasicity and response to augmentation.  Calf Veins: No evidence of thrombus. Normal compressibility and flow on color Doppler imaging.  Superficial Great Saphenous Vein: No evidence of thrombus. Normal compressibility and flow on color Doppler imaging.  Venous Reflux:  None.  Other Findings:  None.  IMPRESSION: No evidence of deep venous thrombosis seen in either lower extremity.   Electronically Signed   By: Roque LiasJames  Green M.D.   On: 02/17/2014 16:59   [place] Arterial Seg Single  02/17/2014   CLINICAL DATA:  Lower extremity claudication and rest pain bilaterally.  EXAM: NONINVASIVE PHYSIOLOGIC VASCULAR STUDY OF BILATERAL LOWER EXTREMITIES  TECHNIQUE: Evaluation of both lower extremities were performed at rest, including calculation  of ankle-brachial indices with single level Doppler, pressure and pulse volume recording.  COMPARISON:  None.  FINDINGS: Right ABI: Ankle pressures are markedly elevated. ABI measurements are spurious.  Left ABI: Ankle pressures are markedly elevated. ABI measurements or spurious.  Right Lower Extremity: Posterior tibial and dorsalis pedis Doppler waveforms are monophasic.  Left Lower Extremity: Posterior tibial and dorsalis pedis waveforms are monophasic.  IMPRESSION: Severe bilateral lower extremity arterial occlusive disease.   Electronically Signed   By: Maryclare BeanArt  Hoss M.D.   On: 02/17/2014 17:02    Scheduled Meds: . amLODipine  10 mg Oral q morning - 10a  . collagenase   Topical Daily  . heparin  5,000 Units Subcutaneous 3 times per day  . piperacillin-tazobactam (ZOSYN)  IV  2.25 g Intravenous Q8H  . saccharomyces boulardii  250 mg Oral BID  . sodium chloride  3 mL Intravenous Q12H  . vancomycin  1,000 mg Intravenous Q48H   Continuous Infusions:    Principal Problem:   Acute on chronic renal failure Active Problems:   ATRIAL FIBRILLATION, PAROXYSMAL   Peripheral vascular disease   Acute diarrhea   DM type 2 causing renal disease   CKD (chronic kidney disease) stage 4, GFR 15-29 ml/min   Fracture of right ankle, lateral malleolus   ARF (acute renal failure)   Bilateral cellulitis of lower leg    Time spent: 30 minutes. Greater than 50% of this time was spent in direct contact with the patient coordinating care.    Chaya JanHERNANDEZ ACOSTA,ESTELA  [facility] Pager [phone]  If 7PM-7AM, please contact night-coverage at [email or web address], password [facility] [email or web address] 02/19/2014, 2:06 PM  LOS: 3 days

## 2014-02-20 LAB — BASIC METABOLIC PANEL
BUN: 108 mg/dL — ABNORMAL HIGH (ref 6–23)
CALCIUM: 7 mg/dL — AB (ref 8.4–10.5)
CO2: 16 meq/L — AB (ref 19–32)
Chloride: 88 mEq/L — ABNORMAL LOW (ref 96–112)
Creatinine, Ser: 3.83 mg/dL — ABNORMAL HIGH (ref 0.50–1.10)
GFR calc Af Amer: 12 mL/min — ABNORMAL LOW (ref >90)
GFR calc non Af Amer: 11 mL/min — ABNORMAL LOW (ref >90)
Glucose, Bld: 120 mg/dL — ABNORMAL HIGH (ref 70–99)
POTASSIUM: 3.9 meq/L (ref 3.7–5.3)
SODIUM: 125 meq/L — AB (ref 137–147)

## 2014-02-20 LAB — GLUCOSE, CAPILLARY
GLUCOSE-CAPILLARY: 109 mg/dL — AB (ref 70–99)
Glucose-Capillary: 134 mg/dL — ABNORMAL HIGH (ref 70–99)

## 2014-02-20 MED ORDER — DOXYCYCLINE HYCLATE 100 MG PO TABS: 100.0000 mg | ORAL_TABLET | Freq: Two times a day (BID) | ORAL | Status: AC

## 2014-02-20 MED ORDER — SODIUM CHLORIDE 0.9 % IV SOLN
INTRAVENOUS | Status: DC
  Administered 2014-02-20: 11:00:00 via INTRAVENOUS

## 2014-02-20 NOTE — Discharge Summary (Signed)
Physician Discharge Summary  [name] ZOX:[id number] DOB: Jun 28, 1935 DOA: 02/16/2014  PCP: Josue HectorNYLAND,LEONARD ROBERT, MD  Admit date: 02/16/2014 Discharge date: 02/20/2014  Time spent: 45 minutes  Recommendations for Outpatient Follow-up:  -Will be discharged home today. -Advised to follow up with PCP in 2 weeks. -Advised to follow up with her nephrologist, Dr. Kathrene BongoGoldsborough in 1 week. -Please note that lasix and diabetes meds are on hold due to ARF and hypoglycemia, respectively.   Discharge Diagnoses:  Principal Problem:   Acute on chronic renal failure Active Problems:   ATRIAL FIBRILLATION, PAROXYSMAL   Peripheral vascular disease   Acute diarrhea   DM type 2 causing renal disease   CKD (chronic kidney disease) stage 4, GFR 15-29 ml/min   Fracture of right ankle, lateral malleolus   ARF (acute renal failure)   Bilateral cellulitis of lower leg   Discharge Condition: Stable and improved  Filed Weights   02/17/14 0505 02/18/14 0556 02/19/14 0548  Weight: 73 kg (160 lb 15 oz) 73.198 kg (161 lb 6 oz) 74.1 kg (163 lb 5.8 oz)    History of present illness:  [name] is a 78 y.o. female with a past medical history of coronary artery disease, status post CABG, chronic kidney disease, gout, diabetes mellitus, peripheral artery disease, who is a very poor historian. She presented to the hospital mainly complaining of a pain in both her legs, specially, the right one because she twisted her ankle a few days ago. And she also mentioned, that she's been making less urine for the last few days. She's unable to really elaborate on any of her symptoms. She however, does tell me that she was started on medications for gout about 2-3 weeks ago. This resulted in onset of severe diarrhea. She stopped taking her medicine a few days ago, but the diarrhea has persisted. She denies any abdominal pain. Has had nausea, but no vomiting. No passing out spells. She's had innumerable amounts of  loose stool. Denies any blood in the stool. No fever. No chills. She was initially very reluctant to stay in the hospital, but now is agreeable. History is very limited.   Hospital Course:   Acute on CKD Stage IV  -Acute component likely prerenal from GI losses.  -Cr has plateaued at around 3.5-3.8. -Last known creatinine was 1.98 in February of 2012. I suspect this may be a new baseline for her -Appreciate renal input.   Right Malleolus Fx  -Appreciate Ortho recommendations for an air cast when out of bed. Note that she can weight-bear as tolerated.  -HH PT/OT on DC.   Bilateral LE Cellulitis  -Will transition to PO doxy for 14 days.  -Venous Dopplers without evidence for DVT or Baker's cyst.  -Continue local wound care.   Hypoglycemia/DM-II  -Is currently maintaining CBGs without need for a dextrose drip.  -All insulin/oral hypoglycemic remain on hold on DC.   Diarrhea  -C Diff negative.  -Likely related to gout meds.   Procedures:  None   Consultations:  Renal  Discharge Instructions  Discharge Instructions   Discontinue IV    Complete by:  As directed      Increase activity slowly    Complete by:  As directed             Medication List    STOP taking these medications       furosemide 40 MG tablet  Commonly known as:  LASIX     glipiZIDE 10  MG tablet  Commonly known as:  GLUCOTROL      TAKE these medications       amLODipine 10 MG tablet  Commonly known as:  NORVASC  Take 10 mg by mouth every morning.     doxycycline 100 MG tablet  Commonly known as:  VIBRA-TABS  Take 1 tablet (100 mg total) by mouth every 12 (twelve) hours.     mometasone 0.1 % cream  Commonly known as:  ELOCON  Apply 1 application topically 2 (two) times daily.       Allergies  Allergen Reactions  . Sulfonamide Derivatives Itching       Follow-up Information   Follow up with [facility]. (They will call you)    Contact information:   [address] [place] [address] [phone]       Follow up with Josue Hector, MD. Schedule an appointment as soon as possible for a visit in 2 weeks.   Specialty:  Family Medicine   Contact information:   [address] [place] [address] [phone]       Follow up with GOLDSBOROUGH,KELLIE A, MD. Schedule an appointment as soon as possible for a visit in 1 week.   Specialty:  Nephrology   Contact information:   [address] [place] [address] [phone]        The results of significant diagnostics from this hospitalization (including imaging, microbiology, ancillary and laboratory) are listed below for reference.    Significant Diagnostic Studies: Dg Ankle Complete Right  02/16/2014   CLINICAL DATA:  Patient fell while walking with a walker 4 days ago. Edema and erythema involving the right ankle. Initial encounter.  EXAM: RIGHT ANKLE - COMPLETE 3+ VIEW  COMPARISON:  None.  FINDINGS: Minimally displaced avulsion fracture involving the tip of the lateral malleolus. No other fractures. Ankle mortise intact with mild joint space narrowing. Generalized osseous demineralization. Small joint effusion. Small plantar calcaneal spur. Extensive calcification involving the tibioperoneal arteries and the dorsalis pedis and plantar arteries.  IMPRESSION: Minimally displaced avulsion fracture involving the tip of the lateral malleolus. No other fractures. Osseous demineralization.   Electronically Signed   By: Hulan Saas M.D.   On: 02/16/2014 20:38   US Renal  02/17/2014   CLINICAL DATA:  Acute renal failure, difficulty with urination  EXAM: RENAL/URINARY TRACT ULTRASOUND COMPLETE  COMPARISON:  None  FINDINGS: Examination limited by body habitus and patient inability to suspend respiration.  Right Kidney:  Length: 10.5 cm marked cortical thinning. Increased cortical echogenicity. Multiple shadowing echogenic foci suggesting calcifications though uncertain if represent  calculi or medullary calcinosis. No gross evidence of mass or hydronephrosis. No perinephric fluid.  Left Kidney:  Length: 10.2 cm. Marked cortical thinning. Increased cortical echogenicity. Hypoechoic nodule with scattered internal echogenicity at mid LEFT kidney, 1.9 x 1.9 x 2.0 cm. Additional echogenic and occasionally shadowing foci are seen within the LEFT kidney, uncertain if represent nonobstructing calculi or medullary calcinosis. No additional mass or perinephric fluid.  Bladder:  Well distended, unremarkable.  Ureteral jets not visualized.  IMPRESSION: Atrophic kidneys with medical renal disease changes bilaterally.  Echogenic and occasionally shadowing foci throughout both kidneys could represent nonobstructing calculi or medullary calcinosis.  Probable mildly complicated cyst mid LEFT kidney 2.0 cm in greatest size   Electronically Signed   By: Ulyses Southward M.D.   On: 02/17/2014 15:57   US Venous Img Lower Bilateral  02/17/2014   CLINICAL DATA:  Bilateral lower extremity  pain and swelling.  EXAM: BILATERAL LOWER EXTREMITY VENOUS DOPPLER ULTRASOUND  TECHNIQUE: Gray-scale sonography with graded compression, as well as color Doppler and duplex ultrasound were performed to evaluate the lower extremity deep venous systems from the level of the common femoral vein and including the common femoral, femoral, profunda femoral, popliteal and calf veins including the posterior tibial, peroneal and gastrocnemius veins when visible. The superficial great saphenous vein was also interrogated. Spectral Doppler was utilized to evaluate flow at rest and with distal augmentation maneuvers in the common femoral, femoral and popliteal veins.  COMPARISON:  None.  FINDINGS: RIGHT LOWER EXTREMITY  Common Femoral Vein: No evidence of thrombus. Normal compressibility, respiratory phasicity and response to augmentation.  Saphenofemoral Junction: No evidence of thrombus. Normal compressibility and flow on color Doppler imaging.   Profunda Femoral Vein: No evidence of thrombus. Normal compressibility and flow on color Doppler imaging.  Femoral Vein: No evidence of thrombus. Normal compressibility, respiratory phasicity and response to augmentation.  Popliteal Vein: No evidence of thrombus. Normal compressibility, respiratory phasicity and response to augmentation.  Calf Veins: No evidence of thrombus. Normal compressibility and flow on color Doppler imaging.  Superficial Great Saphenous Vein: No evidence of thrombus. Normal compressibility and flow on color Doppler imaging.  Venous Reflux:  None.  Other Findings:  None.  LEFT LOWER EXTREMITY  Common Femoral Vein: No evidence of thrombus. Normal compressibility, respiratory phasicity and response to augmentation.  Saphenofemoral Junction: No evidence of thrombus. Normal compressibility and flow on color Doppler imaging.  Profunda Femoral Vein: No evidence of thrombus. Normal compressibility and flow on color Doppler imaging.  Femoral Vein: No evidence of thrombus. Normal compressibility, respiratory phasicity and response to augmentation.  Popliteal Vein: No evidence of thrombus. Normal compressibility, respiratory phasicity and response to augmentation.  Calf Veins: No evidence of thrombus. Normal compressibility and flow on color Doppler imaging.  Superficial Great Saphenous Vein: No evidence of thrombus. Normal compressibility and flow on color Doppler imaging.  Venous Reflux:  None.  Other Findings:  None.  IMPRESSION: No evidence of deep venous thrombosis seen in either lower extremity.   Electronically Signed   By: Roque LiasJames  Green M.D.   On: 02/17/2014 16:59   [place] Arterial Seg Single  02/17/2014   CLINICAL DATA:  Lower extremity claudication and rest pain bilaterally.  EXAM: NONINVASIVE PHYSIOLOGIC VASCULAR STUDY OF BILATERAL LOWER EXTREMITIES  TECHNIQUE: Evaluation of both lower extremities were performed at rest, including calculation of ankle-brachial indices with single level Doppler,  pressure and pulse volume recording.  COMPARISON:  None.  FINDINGS: Right ABI: Ankle pressures are markedly elevated. ABI measurements are spurious.  Left ABI: Ankle pressures are markedly elevated. ABI measurements or spurious.  Right Lower Extremity: Posterior tibial and dorsalis pedis Doppler waveforms are monophasic.  Left Lower Extremity: Posterior tibial and dorsalis pedis waveforms are monophasic.  IMPRESSION: Severe bilateral lower extremity arterial occlusive disease.   Electronically Signed   By: Maryclare BeanArt  Hoss M.D.   On: 02/17/2014 17:02    Microbiology: Recent Results (from the past 240 hour(s))  CLOSTRIDIUM DIFFICILE BY PCR     Status: None   Collection Time    02/17/14  1:04 AM      Result Value Ref Range Status   C difficile by pcr NEGATIVE  NEGATIVE Final     Labs: Basic Metabolic Panel:  Recent Labs Lab 02/16/14 1930 02/17/14 0625 02/18/14 0648 02/19/14 0626 02/20/14 0558  NA 133* 134* 131* 126* 125*  K 3.5* 3.4* 3.6* 4.0  3.9  CL 94* 96 93* 90* 88*  CO2 19 19 18* 19 16*  GLUCOSE 50* 49* 44* 128* 120*  BUN 110* 109* 107* 109* 108*  CREATININE 3.24* 3.25* 3.28* 3.53* 3.83*  CALCIUM 8.0* 7.4* 7.2* 7.0* 7.0*  MG  --   --  2.1  --   --    Liver Function Tests:  Recent Labs Lab 02/16/14 1930 02/17/14 0625  AST 23 34  ALT 24 28  ALKPHOS 89 137*  BILITOT 0.5 0.8  PROT 5.4* 4.7*  ALBUMIN 2.1* 1.8*   No results found for this basename: LIPASE, AMYLASE,  in the last 168 hours No results found for this basename: AMMONIA,  in the last 168 hours CBC:  Recent Labs Lab 02/16/14 1930 02/17/14 0625 02/18/14 0648 02/19/14 0626  WBC 6.9 4.7 3.3* 3.2*  NEUTROABS 5.8  --   --   --   HGB 11.8* 10.8* 11.1* 10.8*  HCT 34.7* 32.9* 32.3* 31.1*  MCV 79.0 79.7 79.4 78.7  PLT 221 171 182 169   Cardiac Enzymes: No results found for this basename: CKTOTAL, CKMB, CKMBINDEX, TROPONINI,  in the last 168 hours BNP: BNP (last 3 results) No results found for this basename:  PROBNP,  in the last 8760 hours CBG:  Recent Labs Lab 02/19/14 1149 02/19/14 1806 02/19/14 2328 02/20/14 0808 02/20/14 1219  GLUCAP 138* 195* 162* 109* 134*       Signed:  HERNANDEZ ACOSTA,ESTELA  [facility] Pager: [phone] 02/20/2014, 1:20 PM

## 2014-02-20 NOTE — Progress Notes (Signed)
Client stable, discharged to home with [facility]. Faxed [facility] and discharge instructions. Reviewed discharge instructions with the patient and gave prescription for antibiotic. Reviewed medications and instructed on when next dose due. Understanding voiced.

## 2014-02-20 NOTE — Consult Note (Signed)
Reason for Consult: Renal failure Referring Physician: Vincent Gros is an 78 y.o. female.  HPI: Patient wheeze history of diabetes, currently status post CABG, chronic renal failure stage IV presently admitted because of fall and also history of diarrhea. According to the patient she was started on medication for gout and since then she was having some diarrhea. Patient also And sustained fracture of right ankle. Presently she denies any nausea or vomiting. Her appetite is good and denies any difficulty breathing. Past Medical History  Diagnosis Date  . Paroxysmal atrial fibrillation   . PVD (peripheral vascular disease)     status post fem-fem bypass in sept/2009  . Junctional bradycardia     in the setting of hyperkalemia in the past  . GI bleeding   . Coronary artery disease   . Mitral regurgitation     status post annuloplasty ring.  . Pulmonary hypertension     moderate  . Hypertension   . Diabetes mellitus   . Renal insufficiency     in feb. 2008, acute on chronic    Past Surgical History  Procedure Laterality Date  . Coronary artery bypass graft      LIMA to the LAD, SVG to PDA, SVG to posterolateral, SVG to diagonal 2000  . Cholecystectomy    . Amputation      partial of right thumb    Family History  Problem Relation Age of Onset  . Cancer Mother     deceased  . Heart attack Father     deceased    Social History:  reports that she quit smoking about 15 years ago. Her smoking use included Cigarettes. She smoked 0.00 packs per day. She does not have any smokeless tobacco history on file. She reports that she does not drink alcohol or use illicit drugs.  Allergies:  Allergies  Allergen Reactions  . Sulfonamide Derivatives Itching    Medications: I have reviewed the patient's current medications.  Results for orders placed during the hospital encounter of 02/16/14 (from the past 48 hour(s))  GLUCOSE, CAPILLARY     Status: Abnormal    Collection Time    02/18/14 10:35 AM      Result Value Ref Range   Glucose-Capillary 171 (*) 70 - 99 mg/dL  GLUCOSE, CAPILLARY     Status: Abnormal   Collection Time    02/18/14 12:18 PM      Result Value Ref Range   Glucose-Capillary 136 (*) 70 - 99 mg/dL   Comment 1 Notify RN    GLUCOSE, CAPILLARY     Status: Abnormal   Collection Time    02/18/14  4:26 PM      Result Value Ref Range   Glucose-Capillary 184 (*) 70 - 99 mg/dL  GLUCOSE, CAPILLARY     Status: Abnormal   Collection Time    02/18/14  8:40 PM      Result Value Ref Range   Glucose-Capillary 164 (*) 70 - 99 mg/dL   Comment 1 Notify RN    BASIC METABOLIC PANEL     Status: Abnormal   Collection Time    02/19/14  6:26 AM      Result Value Ref Range   Sodium 126 (*) 137 - 147 mEq/L   Potassium 4.0  3.7 - 5.3 mEq/L   Chloride 90 (*) 96 - 112 mEq/L   CO2 19  19 - 32 mEq/L   Glucose, Bld 128 (*) 70 - 99 mg/dL  BUN 109 (*) 6 - 23 mg/dL   Creatinine, Ser 3.53 (*) 0.50 - 1.10 mg/dL   Calcium 7.0 (*) 8.4 - 10.5 mg/dL   GFR calc non Af Amer 12 (*) >90 mL/min   GFR calc Af Amer 13 (*) >90 mL/min   Comment: (NOTE)     The eGFR has been calculated using the CKD EPI equation.     This calculation has not been validated in all clinical situations.     eGFR's persistently <90 mL/min signify possible Chronic Kidney     Disease.   Anion gap 17 (*) 5 - 15  CBC     Status: Abnormal   Collection Time    02/19/14  6:26 AM      Result Value Ref Range   WBC 3.2 (*) 4.0 - 10.5 K/uL   RBC 3.95  3.87 - 5.11 MIL/uL   Hemoglobin 10.8 (*) 12.0 - 15.0 g/dL   HCT 31.1 (*) 36.0 - 46.0 %   MCV 78.7  78.0 - 100.0 fL   MCH 27.3  26.0 - 34.0 pg   MCHC 34.7  30.0 - 36.0 g/dL   RDW 14.2  11.5 - 15.5 %   Platelets 169  150 - 400 K/uL  GLUCOSE, CAPILLARY     Status: Abnormal   Collection Time    02/19/14  7:40 AM      Result Value Ref Range   Glucose-Capillary 146 (*) 70 - 99 mg/dL   Comment 1 Notify RN    GLUCOSE, CAPILLARY      Status: Abnormal   Collection Time    02/19/14 11:49 AM      Result Value Ref Range   Glucose-Capillary 138 (*) 70 - 99 mg/dL   Comment 1 Notify RN    GLUCOSE, CAPILLARY     Status: Abnormal   Collection Time    02/19/14  6:06 PM      Result Value Ref Range   Glucose-Capillary 195 (*) 70 - 99 mg/dL  GLUCOSE, CAPILLARY     Status: Abnormal   Collection Time    02/19/14 11:28 PM      Result Value Ref Range   Glucose-Capillary 162 (*) 70 - 99 mg/dL   Comment 1 Documented in Chart     Comment 2 Notify RN    BASIC METABOLIC PANEL     Status: Abnormal   Collection Time    02/20/14  5:58 AM      Result Value Ref Range   Sodium 125 (*) 137 - 147 mEq/L   Potassium 3.9  3.7 - 5.3 mEq/L   Chloride 88 (*) 96 - 112 mEq/L   CO2 16 (*) 19 - 32 mEq/L   Glucose, Bld 120 (*) 70 - 99 mg/dL   BUN 108 (*) 6 - 23 mg/dL   Creatinine, Ser 3.83 (*) 0.50 - 1.10 mg/dL   Calcium 7.0 (*) 8.4 - 10.5 mg/dL   GFR calc non Af Amer 11 (*) >90 mL/min   GFR calc Af Amer 12 (*) >90 mL/min   Comment: (NOTE)     The eGFR has been calculated using the CKD EPI equation.     This calculation has not been validated in all clinical situations.     eGFR's persistently <90 mL/min signify possible Chronic Kidney     Disease.  GLUCOSE, CAPILLARY     Status: Abnormal   Collection Time    02/20/14  8:08 AM      Result Value Ref  Range   Glucose-Capillary 109 (*) 70 - 99 mg/dL   Comment 1 Notify RN      No results found.  Review of Systems  Constitutional: Positive for weight loss.  Respiratory: Negative for shortness of breath.   Cardiovascular: Positive for leg swelling.  Gastrointestinal: Positive for diarrhea. Negative for nausea and vomiting.  Musculoskeletal: Positive for falls.   Blood pressure 132/78, pulse 72, temperature 97.3 F (36.3 C), temperature source Oral, resp. rate 20, height '5\' 5"'  (1.651 m), weight 74.1 kg (163 lb 5.8 oz), SpO2 98.00%. Physical Exam  Constitutional: She is oriented to  person, place, and time. No distress.  Patient is emaciated, poor skin turgger and sunken eyes  Neck: JVD present.  Cardiovascular:  irigular rate and rythm  Respiratory: No respiratory distress. She has no wheezes.  GI: She exhibits no distension. There is no tenderness.  Musculoskeletal: She exhibits no edema.  Neurological: She is alert and oriented to person, place, and time.    Assessment/Plan: Problem #1 renal failure: Chronic. Patient had elevated pending creatinine at least for the last 6 years and she was stage IV. She is also being followed by Dr. Lorenso Quarry as outpatient. She was told she may require dialysis but she doesn't have any access placed. Patient doesn't have any uremic sign and symptoms. Presently her BUN and creatinine seems to be increasing. Problem #2 anemia: Her hemoglobin is with in our target goal. Problem #history of diabetes Problem #4 creatinine is status post CABG she doesn't have any chest pain Problem #5 history of peripheral vascular disease Problem #6 history of old with fracture of left lateral malleolus and being followed by surgery. Problem #7 history of hypertension: Her blood pressure is reasonably controlled. Problem #8 hyponatremia: Possibly hypovolemic hypernatremia. Plan: We'll start patient on  normal saline at 75 cc per hour Presently if her renal function doesn't improve patient may require dialysis possibly as an outpatient. We'll check her basic metabolic panel, phosphorus in the morning.  Hoyt Leanos S 02/20/2014, 10:10 AM

## 2014-02-24 ENCOUNTER — Encounter (HOSPITAL_COMMUNITY): Payer: Self-pay | Admitting: Emergency Medicine

## 2014-02-24 ENCOUNTER — Inpatient Hospital Stay (HOSPITAL_COMMUNITY)
Admission: EM | Admit: 2014-02-24 | Discharge: 2014-03-04 | DRG: 871 | Disposition: A | Discharge status: Skilled Nursing Facility | Payer: Medicare Other | Attending: Internal Medicine | Admitting: Internal Medicine

## 2014-02-24 ENCOUNTER — Emergency Department (HOSPITAL_COMMUNITY): Payer: Medicare Other

## 2014-02-24 ENCOUNTER — Inpatient Hospital Stay (HOSPITAL_COMMUNITY): Payer: Medicare Other

## 2014-02-24 DIAGNOSIS — N185 Chronic kidney disease, stage 5: Secondary | ICD-10-CM | POA: Diagnosis not present

## 2014-02-24 DIAGNOSIS — E43 Unspecified severe protein-calorie malnutrition: Secondary | ICD-10-CM | POA: Diagnosis present

## 2014-02-24 DIAGNOSIS — E1142 Type 2 diabetes mellitus with diabetic polyneuropathy: Secondary | ICD-10-CM | POA: Diagnosis present

## 2014-02-24 DIAGNOSIS — E1122 Type 2 diabetes mellitus with diabetic chronic kidney disease: Secondary | ICD-10-CM | POA: Diagnosis present

## 2014-02-24 DIAGNOSIS — Z6828 Body mass index (BMI) 28.0-28.9, adult: Secondary | ICD-10-CM

## 2014-02-24 DIAGNOSIS — N179 Acute kidney failure, unspecified: Secondary | ICD-10-CM | POA: Diagnosis present

## 2014-02-24 DIAGNOSIS — T68XXXA Hypothermia, initial encounter: Secondary | ICD-10-CM

## 2014-02-24 DIAGNOSIS — A419 Sepsis, unspecified organism: Principal | ICD-10-CM | POA: Diagnosis present

## 2014-02-24 DIAGNOSIS — I080 Rheumatic disorders of both mitral and aortic valves: Secondary | ICD-10-CM | POA: Diagnosis present

## 2014-02-24 DIAGNOSIS — L03116 Cellulitis of left lower limb: Secondary | ICD-10-CM | POA: Diagnosis present

## 2014-02-24 DIAGNOSIS — W19XXXA Unspecified fall, initial encounter: Secondary | ICD-10-CM | POA: Diagnosis present

## 2014-02-24 DIAGNOSIS — Z6827 Body mass index (BMI) 27.0-27.9, adult: Secondary | ICD-10-CM | POA: Diagnosis not present

## 2014-02-24 DIAGNOSIS — N184 Chronic kidney disease, stage 4 (severe): Secondary | ICD-10-CM

## 2014-02-24 DIAGNOSIS — E871 Hypo-osmolality and hyponatremia: Secondary | ICD-10-CM

## 2014-02-24 DIAGNOSIS — S80811A Abrasion, right lower leg, initial encounter: Secondary | ICD-10-CM | POA: Diagnosis present

## 2014-02-24 DIAGNOSIS — R6510 Systemic inflammatory response syndrome (SIRS) of non-infectious origin without acute organ dysfunction: Secondary | ICD-10-CM

## 2014-02-24 DIAGNOSIS — S80812A Abrasion, left lower leg, initial encounter: Secondary | ICD-10-CM | POA: Diagnosis present

## 2014-02-24 DIAGNOSIS — Z992 Dependence on renal dialysis: Secondary | ICD-10-CM

## 2014-02-24 DIAGNOSIS — E11649 Type 2 diabetes mellitus with hypoglycemia without coma: Secondary | ICD-10-CM | POA: Diagnosis present

## 2014-02-24 DIAGNOSIS — S40811A Abrasion of right upper arm, initial encounter: Secondary | ICD-10-CM | POA: Diagnosis present

## 2014-02-24 DIAGNOSIS — Z952 Presence of prosthetic heart valve: Secondary | ICD-10-CM

## 2014-02-24 DIAGNOSIS — L309 Dermatitis, unspecified: Secondary | ICD-10-CM | POA: Diagnosis present

## 2014-02-24 DIAGNOSIS — I120 Hypertensive chronic kidney disease with stage 5 chronic kidney disease or end stage renal disease: Secondary | ICD-10-CM | POA: Diagnosis present

## 2014-02-24 DIAGNOSIS — I272 Other secondary pulmonary hypertension: Secondary | ICD-10-CM | POA: Diagnosis present

## 2014-02-24 DIAGNOSIS — I480 Paroxysmal atrial fibrillation: Secondary | ICD-10-CM | POA: Diagnosis present

## 2014-02-24 DIAGNOSIS — I739 Peripheral vascular disease, unspecified: Secondary | ICD-10-CM | POA: Diagnosis present

## 2014-02-24 DIAGNOSIS — I2510 Atherosclerotic heart disease of native coronary artery without angina pectoris: Secondary | ICD-10-CM | POA: Diagnosis present

## 2014-02-24 DIAGNOSIS — I10 Essential (primary) hypertension: Secondary | ICD-10-CM

## 2014-02-24 DIAGNOSIS — I340 Nonrheumatic mitral (valve) insufficiency: Secondary | ICD-10-CM | POA: Diagnosis present

## 2014-02-24 DIAGNOSIS — E1129 Type 2 diabetes mellitus with other diabetic kidney complication: Secondary | ICD-10-CM | POA: Diagnosis present

## 2014-02-24 DIAGNOSIS — N186 End stage renal disease: Secondary | ICD-10-CM | POA: Diagnosis present

## 2014-02-24 DIAGNOSIS — E872 Acidosis: Secondary | ICD-10-CM | POA: Diagnosis present

## 2014-02-24 DIAGNOSIS — E1121 Type 2 diabetes mellitus with diabetic nephropathy: Secondary | ICD-10-CM

## 2014-02-24 DIAGNOSIS — R680 Hypothermia, not associated with low environmental temperature: Secondary | ICD-10-CM | POA: Diagnosis present

## 2014-02-24 DIAGNOSIS — L03115 Cellulitis of right lower limb: Secondary | ICD-10-CM | POA: Diagnosis present

## 2014-02-24 DIAGNOSIS — G7281 Critical illness myopathy: Secondary | ICD-10-CM

## 2014-02-24 DIAGNOSIS — R197 Diarrhea, unspecified: Secondary | ICD-10-CM

## 2014-02-24 DIAGNOSIS — Z951 Presence of aortocoronary bypass graft: Secondary | ICD-10-CM | POA: Diagnosis not present

## 2014-02-24 DIAGNOSIS — E162 Hypoglycemia, unspecified: Secondary | ICD-10-CM

## 2014-02-24 DIAGNOSIS — Z87891 Personal history of nicotine dependence: Secondary | ICD-10-CM

## 2014-02-24 DIAGNOSIS — I5023 Acute on chronic systolic (congestive) heart failure: Secondary | ICD-10-CM | POA: Diagnosis present

## 2014-02-24 DIAGNOSIS — E876 Hypokalemia: Secondary | ICD-10-CM | POA: Diagnosis not present

## 2014-02-24 DIAGNOSIS — N189 Chronic kidney disease, unspecified: Secondary | ICD-10-CM

## 2014-02-24 DIAGNOSIS — I5021 Acute systolic (congestive) heart failure: Secondary | ICD-10-CM

## 2014-02-24 HISTORY — DX: Cardiac arrhythmia, unspecified: I49.9

## 2014-02-24 LAB — COMPREHENSIVE METABOLIC PANEL
ALK PHOS: 190 U/L — AB (ref 39–117)
ALT: 45 U/L — ABNORMAL HIGH (ref 0–35)
AST: 48 U/L — AB (ref 0–37)
Albumin: 2 g/dL — ABNORMAL LOW (ref 3.5–5.2)
Anion gap: 24 — ABNORMAL HIGH (ref 5–15)
BILIRUBIN TOTAL: 0.7 mg/dL (ref 0.3–1.2)
BUN: 126 mg/dL — ABNORMAL HIGH (ref 6–23)
CHLORIDE: 87 meq/L — AB (ref 96–112)
CO2: 15 mEq/L — ABNORMAL LOW (ref 19–32)
Calcium: 7.4 mg/dL — ABNORMAL LOW (ref 8.4–10.5)
Creatinine, Ser: 4.35 mg/dL — ABNORMAL HIGH (ref 0.50–1.10)
GFR calc Af Amer: 10 mL/min — ABNORMAL LOW (ref >90)
GFR calc non Af Amer: 9 mL/min — ABNORMAL LOW (ref >90)
Glucose, Bld: 94 mg/dL (ref 70–99)
POTASSIUM: 4.3 meq/L (ref 3.7–5.3)
Sodium: 126 mEq/L — ABNORMAL LOW (ref 137–147)
Total Protein: 5.3 g/dL — ABNORMAL LOW (ref 6.0–8.3)

## 2014-02-24 LAB — CBC WITH DIFFERENTIAL/PLATELET
Basophils Absolute: 0 10*3/uL (ref 0.0–0.1)
Basophils Relative: 0 % (ref 0–1)
Eosinophils Absolute: 0 10*3/uL (ref 0.0–0.7)
Eosinophils Relative: 0 % (ref 0–5)
HCT: 32.8 % — ABNORMAL LOW (ref 36.0–46.0)
Hemoglobin: 11.6 g/dL — ABNORMAL LOW (ref 12.0–15.0)
LYMPHS ABS: 0.4 10*3/uL — AB (ref 0.7–4.0)
LYMPHS PCT: 4 % — AB (ref 12–46)
MCH: 27.3 pg (ref 26.0–34.0)
MCHC: 35.4 g/dL (ref 30.0–36.0)
MCV: 77.2 fL — ABNORMAL LOW (ref 78.0–100.0)
Monocytes Absolute: 0.3 10*3/uL (ref 0.1–1.0)
Monocytes Relative: 3 % (ref 3–12)
NEUTROS PCT: 93 % — AB (ref 43–77)
Neutro Abs: 9.6 10*3/uL — ABNORMAL HIGH (ref 1.7–7.7)
PLATELETS: 242 10*3/uL (ref 150–400)
RBC: 4.25 MIL/uL (ref 3.87–5.11)
RDW: 14.2 % (ref 11.5–15.5)
WBC: 10.3 10*3/uL (ref 4.0–10.5)

## 2014-02-24 LAB — URINALYSIS, ROUTINE W REFLEX MICROSCOPIC
Bilirubin Urine: NEGATIVE
GLUCOSE, UA: NEGATIVE mg/dL
Hgb urine dipstick: NEGATIVE
Ketones, ur: NEGATIVE mg/dL
LEUKOCYTES UA: NEGATIVE
Nitrite: NEGATIVE
PH: 5 (ref 5.0–8.0)
Protein, ur: NEGATIVE mg/dL
Specific Gravity, Urine: 1.012 (ref 1.005–1.030)
Urobilinogen, UA: 0.2 mg/dL (ref 0.0–1.0)

## 2014-02-24 LAB — GLUCOSE, CAPILLARY
GLUCOSE-CAPILLARY: 43 mg/dL — AB (ref 70–99)
GLUCOSE-CAPILLARY: 44 mg/dL — AB (ref 70–99)
GLUCOSE-CAPILLARY: 53 mg/dL — AB (ref 70–99)
GLUCOSE-CAPILLARY: 121 mg/dL — AB (ref 70–99)
Glucose-Capillary: 143 mg/dL — ABNORMAL HIGH (ref 70–99)

## 2014-02-24 LAB — I-STAT CHEM 8, ED
BUN: 128 mg/dL — AB (ref 6–23)
Calcium, Ion: 0.94 mmol/L — ABNORMAL LOW (ref 1.13–1.30)
Chloride: 93 mEq/L — ABNORMAL LOW (ref 96–112)
Creatinine, Ser: 4.8 mg/dL — ABNORMAL HIGH (ref 0.50–1.10)
Glucose, Bld: 95 mg/dL (ref 70–99)
HCT: 38 % (ref 36.0–46.0)
HEMOGLOBIN: 12.9 g/dL (ref 12.0–15.0)
Potassium: 4.1 mEq/L (ref 3.7–5.3)
SODIUM: 122 meq/L — AB (ref 137–147)
TCO2: 15 mmol/L (ref 0–100)

## 2014-02-24 LAB — I-STAT CG4 LACTIC ACID, ED: Lactic Acid, Venous: 0.63 mmol/L (ref 0.5–2.2)

## 2014-02-24 LAB — CBG MONITORING, ED: Glucose-Capillary: 98 mg/dL (ref 70–99)

## 2014-02-24 LAB — TROPONIN I

## 2014-02-24 MED ORDER — ACETAMINOPHEN 325 MG PO TABS
650.0000 mg | ORAL_TABLET | Freq: Four times a day (QID) | ORAL | Status: DC | PRN
  Administered 2014-02-24 – 2014-02-25 (×3): 650 mg via ORAL
  Filled 2014-02-24 (×3): qty 2

## 2014-02-24 MED ORDER — FUROSEMIDE 10 MG/ML IJ SOLN
40.0000 mg | Freq: Two times a day (BID) | INTRAMUSCULAR | Status: DC
  Administered 2014-02-24: 40 mg via INTRAVENOUS
  Filled 2014-02-24: qty 4

## 2014-02-24 MED ORDER — ACETAMINOPHEN 650 MG RE SUPP: 650.0000 mg | Freq: Four times a day (QID) | RECTAL | Status: DC | PRN

## 2014-02-24 MED ORDER — ENOXAPARIN SODIUM 30 MG/0.3ML ~~LOC~~ SOLN
30.0000 mg | SUBCUTANEOUS | Status: DC
  Administered 2014-02-24: 30 mg via SUBCUTANEOUS
  Filled 2014-02-24 (×2): qty 0.3

## 2014-02-24 MED ORDER — DEXTROSE 50 % IV SOLN
25.0000 mL | Freq: Once | INTRAVENOUS | Status: AC | PRN
  Administered 2014-02-24: 25 mL via INTRAVENOUS
  Filled 2014-02-24: qty 50

## 2014-02-24 MED ORDER — INSULIN ASPART 100 UNIT/ML ~~LOC~~ SOLN
0.0000 [IU] | Freq: Three times a day (TID) | SUBCUTANEOUS | Status: DC
  Administered 2014-02-26: 5 [IU] via SUBCUTANEOUS
  Administered 2014-02-26: 2 [IU] via SUBCUTANEOUS
  Administered 2014-02-28: 1 [IU] via SUBCUTANEOUS
  Administered 2014-03-02: 2 [IU] via SUBCUTANEOUS
  Administered 2014-03-02: 1 [IU] via SUBCUTANEOUS
  Administered 2014-03-03: 2 [IU] via SUBCUTANEOUS

## 2014-02-24 MED ORDER — PIPERACILLIN-TAZOBACTAM 3.375 G IVPB 30 MIN
3.3750 g | Freq: Once | INTRAVENOUS | Status: AC
  Administered 2014-02-24: 3.375 g via INTRAVENOUS
  Filled 2014-02-24: qty 50

## 2014-02-24 MED ORDER — VANCOMYCIN HCL IN DEXTROSE 1-5 GM/200ML-% IV SOLN: 1000.0000 mg | Freq: Once | INTRAVENOUS | Status: DC

## 2014-02-24 MED ORDER — PIPERACILLIN-TAZOBACTAM IN DEX 2-0.25 GM/50ML IV SOLN
2.2500 g | Freq: Four times a day (QID) | INTRAVENOUS | Status: DC
  Administered 2014-02-24 – 2014-02-28 (×13): 2.25 g via INTRAVENOUS
  Filled 2014-02-24 (×18): qty 50

## 2014-02-24 MED ORDER — VANCOMYCIN HCL IN DEXTROSE 1-5 GM/200ML-% IV SOLN
1000.0000 mg | INTRAVENOUS | Status: DC
  Filled 2014-02-24 (×2): qty 200

## 2014-02-24 MED ORDER — VANCOMYCIN HCL 10 G IV SOLR
1500.0000 mg | Freq: Once | INTRAVENOUS | Status: AC
  Administered 2014-02-24: 1500 mg via INTRAVENOUS
  Filled 2014-02-24: qty 1500

## 2014-02-24 MED ORDER — SODIUM CHLORIDE 0.9 % IV SOLN
Freq: Once | INTRAVENOUS | Status: AC
  Administered 2014-02-24: 13:00:00 via INTRAVENOUS

## 2014-02-24 NOTE — ED Provider Notes (Signed)
CSN: [id number]     Arrival date & time 02/24/14  1021 History   First MD Initiated Contact with Patient 02/24/14 1026     Chief Complaint  Patient presents with  . Fatigue     (Consider location/radiation/quality/duration/timing/severity/associated sxs/prior Treatment) HPI  This is a 78 yo female with history of atrial fibrillation, peripheral vascular disease, coronary artery disease, pulmonary hypertension, diabetes, and chronic kidney disease who presents with worsening lower extremity edema. EMS states that they were called out to the patient's living facility for worsening lower extremity edema. Patient was just discharged on October 10 after being admitted for cellulitis. Patient has no physical complaints right now. Per EMS, patient noted to have a blood sugar of 42. She was given an amp of D50.  Patient denies any chest pain, shortness of breath, abdominal pain, nausea, vomiting. Denies any fevers. She is currently on antibiotics as an outpatient for her cellulitis.  Patient was admitted to a [facility].  She was treated for cellulitis.  She was also noted to be hypoglycemic and her diabetes medications were stopped. She had worsening renal failure and was evaluated by nephrology. She reports that she is being set up for dialysis.  Past Medical History  Diagnosis Date  . Paroxysmal atrial fibrillation   . PVD (peripheral vascular disease)     status post fem-fem bypass in sept/2009  . Junctional bradycardia     in the setting of hyperkalemia in the past  . GI bleeding   . Coronary artery disease   . Mitral regurgitation     status post annuloplasty ring.  . Pulmonary hypertension     moderate  . Hypertension   . Diabetes mellitus   . Renal insufficiency     in feb. 2008, acute on chronic   Past Surgical History  Procedure Laterality Date  . Coronary artery bypass graft      LIMA to the LAD, SVG to PDA, SVG to posterolateral, SVG to diagonal 2000  .  Cholecystectomy    . Amputation      partial of right thumb   Family History  Problem Relation Age of Onset  . Cancer Mother     deceased  . Heart attack Father     deceased   History  Substance Use Topics  . Smoking status: Former Smoker    Types: Cigarettes    Quit date: 05/14/1998  . Smokeless tobacco: Not on file  . Alcohol Use: No   OB History   Grav Para Term Preterm Abortions TAB SAB Ect Mult Living                 Review of Systems  Constitutional: Negative for fever.  Respiratory: Negative for chest tightness and shortness of breath.   Cardiovascular: Positive for leg swelling. Negative for chest pain.  Gastrointestinal: Negative for nausea, vomiting and abdominal pain.  Genitourinary: Negative for dysuria.  Musculoskeletal: Negative for back pain.  Skin: Positive for wound.  Neurological: Negative for headaches.  Psychiatric/Behavioral: Negative for confusion.  All other systems reviewed and are negative.     Allergies  Sulfonamide derivatives and Codeine  Home Medications   Prior to Admission medications   Medication Sig Start Date End Date Taking? Authorizing Provider  amLODipine (NORVASC) 10 MG tablet Take 10 mg by mouth every morning.   Yes Historical Provider, MD  doxycycline (VIBRA-TABS) 100 MG tablet Take 1 tablet (100 mg total) by mouth every 12 (twelve) hours. 02/20/14  Yes Estela  Isaiah BlakesY Hernandez Acosta, MD  furosemide (LASIX) 80 MG tablet Take 80-160 mg by mouth 2 (two) times daily as needed for fluid (Takes 1 tablet daily, but takes a second dose later in the day if needed.).    Yes Historical Provider, MD  glipiZIDE (GLUCOTROL XL) 10 MG 24 hr tablet Take 10 mg by mouth 2 (two) times daily.   Yes Historical Provider, MD  mometasone (ELOCON) 0.1 % cream Apply 1 application topically 2 (two) times daily.  02/11/14  Yes Historical Provider, MD   BP 124/49  Pulse 39  Temp(Src) 92.6 F (33.7 C) (Rectal)  Resp 16  Ht 5' 4.96" (1.65 m)  Wt 163 lb 5.8  oz (74.1 kg)  BMI 27.22 kg/m2  SpO2 98% Physical Exam  Nursing note and vitals reviewed. Constitutional: She is oriented to person, place, and time. No distress.  Chronically ill-appearing  HENT:  Head: Normocephalic and atraumatic.  Mouth/Throat: Oropharynx is clear and moist.  Eyes: Pupils are equal, round, and reactive to light.  Neck: Neck supple.  Cardiovascular: Normal rate and normal heart sounds.   Irregular rhythm  Pulmonary/Chest: Effort normal and breath sounds normal. No respiratory distress. She has no wheezes.  Midline sternotomy scar  Abdominal: Soft. Bowel sounds are normal. There is no tenderness. There is no rebound.  Multiple abdominal scar  Musculoskeletal:  2+ bilateral lower extremity edema to the knees, associated erythema  Neurological: She is alert and oriented to person, place, and time.  Skin: Skin is warm and dry.  Well granulated ulcers over the bilateral ankles, no purulent discharge  Psychiatric: She has a normal mood and affect.    ED Course  Procedures (including critical care time) Labs Review Labs Reviewed  CBC WITH DIFFERENTIAL - Abnormal; Notable for the following:    Hemoglobin 11.6 (*)    HCT 32.8 (*)    MCV 77.2 (*)    Neutrophils Relative % 93 (*)    Neutro Abs 9.6 (*)    Lymphocytes Relative 4 (*)    Lymphs Abs 0.4 (*)    All other components within normal limits  URINALYSIS, ROUTINE W REFLEX MICROSCOPIC - Abnormal; Notable for the following:    APPearance HAZY (*)    All other components within normal limits  COMPREHENSIVE METABOLIC PANEL - Abnormal; Notable for the following:    Sodium 126 (*)    Chloride 87 (*)    CO2 15 (*)    BUN 126 (*)    Creatinine, Ser 4.35 (*)    Calcium 7.4 (*)    Total Protein 5.3 (*)    Albumin 2.0 (*)    AST 48 (*)    ALT 45 (*)    Alkaline Phosphatase 190 (*)    GFR calc non Af Amer 9 (*)    GFR calc Af Amer 10 (*)    Anion gap 24 (*)    All other components within normal limits  I-STAT  CHEM 8, ED - Abnormal; Notable for the following:    Sodium 122 (*)    Chloride 93 (*)    BUN 128 (*)    Creatinine, Ser 4.80 (*)    Calcium, Ion 0.94 (*)    All other components within normal limits  CULTURE, BLOOD (ROUTINE X 2)  CULTURE, BLOOD (ROUTINE X 2)  URINE CULTURE  TROPONIN I  I-STAT CG4 LACTIC ACID, ED  CBG MONITORING, ED    Imaging Review Dg Chest Portable 1 View  02/24/2014   CLINICAL DATA:  Fatigue, nausea, and vomiting.  EXAM: PORTABLE CHEST - 1 VIEW  COMPARISON:  Two-view chest 06/15/2008  FINDINGS: The heart is enlarged. The size is exaggerated by low lung volumes. Atherosclerotic changes are again noted at the aortic arch. Diffuse interstitial coarsening is chronic. This is exaggerated by the low lung volumes. No focal airspace disease is evident. The visualized soft tissues and bony thorax are unremarkable.  IMPRESSION: 1. Low lung volumes. 2. Stable cardiomegaly without failure.   Electronically Signed   By: Gennette Pachris  Mattern M.D.   On: 02/24/2014 11:17     EKG Interpretation EKG independently reviewed by myself: Atrial fibrillation with a rate of 56, nonspecific intraventricular conduction delay, PVCs resolved when compared to prior      MDM   Final diagnoses:  SIRS (systemic inflammatory response syndrome)  Acute on chronic kidney failure  Hyponatremia    Patient presents with generalized fatigue and lower surely swelling from home. Noted to be hyperglycemic in route and is status post D50. Repeat glucose 147. Patient is chronically ill appearing. Vital signs notable for a temperature of 92.6 rectally. Sepsis workup initiated. Patient has no complaints at this time and is hemodynamically stable. Will await lab work prior to giving antibiotics. Patient is currently on doxycycline for her lower extremity cellulitis.  Workup with normal lactate. Patient with persistent hyponatremia, worsening uremia and creatinine that has also acutely worsened. White blood count is  10.3 with a left shift. Given this, we will give vancomycin and Zosyn empirically. Urine and chest show no evidence of infection. Could be related to lower extremity wounds and cellulitis.  Given that patient meets surgical criteria and is hypothermic, will admit for further evaluation. Her primary nephrologist is Annie SableKellie Goldsborough.  No emergent need for dialysis at this time. Patient will likely need further evaluation as an inpatient by nephrology.  [facility], will initiate 75 cc per hour of normal saline in the setting of hypervolemic hyponatremia.    Shon Batonourtney F Kona Yusuf, MD 02/24/14 1257

## 2014-02-24 NOTE — Progress Notes (Signed)
Patient covered with warm blankets and heat packs.  Temp in room has been raised as well to help with pt's temperature.  Materials called and stated portable equipment provides Lawyer[employer]air hugger.  Portable equipment denies having [employer] visitorhugger machine.  I have ordered a k pad in the mean time for the patient.  Will try to find bair hugger machine.  Will continue to monitor pt. [name], Ivan Maskell L

## 2014-02-24 NOTE — Progress Notes (Addendum)
ANTIBIOTIC CONSULT NOTE - INITIAL  Pharmacy Consult:  Vancomycin / Zosyn Indication:  Sepsis  Allergies  Allergen Reactions  . Sulfonamide Derivatives Itching  . Codeine Rash    Patient Measurements: Height: 5' 4.96" (165 cm) Weight: 163 lb 5.8 oz (74.1 kg) IBW/kg (Calculated) : 56.91  Vital Signs: Temp: 92.6 F (33.7 C) (10/14 1050) Temp Source: Rectal (10/14 1050) BP: 124/49 mmHg (10/14 1130) Pulse Rate: 39 (10/14 1130) Intake/Output from this shift: Total I/O In: -  Out: 400 [Urine:400]  Labs:  Recent Labs  02/24/14 1105 02/24/14 1123  WBC 10.3  --   HGB 11.6* 12.9  PLT 242  --   CREATININE 4.35* 4.80*   Estimated Creatinine Clearance: 10 ml/min (by C-G formula based on Cr of 4.8). No results found for this basename: VANCOTROUGH, Leodis BinetVANCOPEAK, VANCORANDOM, GENTTROUGH, GENTPEAK, GENTRANDOM, TOBRATROUGH, TOBRAPEAK, TOBRARND, AMIKACINPEAK, AMIKACINTROU, AMIKACIN,  in the last 72 hours   Microbiology: Recent Results (from the past 720 hour(s))  CLOSTRIDIUM DIFFICILE BY PCR     Status: None   Collection Time    02/17/14  1:04 AM      Result Value Ref Range Status   C difficile by pcr NEGATIVE  NEGATIVE Final    Medical History: Past Medical History  Diagnosis Date  . Paroxysmal atrial fibrillation   . PVD (peripheral vascular disease)     status post fem-fem bypass in sept/2009  . Junctional bradycardia     in the setting of hyperkalemia in the past  . GI bleeding   . Coronary artery disease   . Mitral regurgitation     status post annuloplasty ring.  . Pulmonary hypertension     moderate  . Hypertension   . Diabetes mellitus   . Renal insufficiency     in feb. 2008, acute on chronic     Assessment: [name] recently discharged from [facility] to start vancomycin and Zosyn for sepsis.  Baseline labs reviewed.  Aware first doses of antibiotics already ordered.  Vanc 10/14 >> Zosyn 10/14 >> Doxy PTA   Goal of Therapy:  Vancomycin trough level 15-20  mcg/ml   Plan:  - Change loading vanc dose to 1500mg  IV x 1, then 1gm IV Q48H - Zosyn 2.25gm IV Q6H - Monitor renal fxn (HD plans), clinical progress, vanc trough as indicated - F/U calcium supplementation    Quanesha Klimaszewski D. Laney Potashang, PharmD, BCPS Pager:  [phone] 02/24/2014, 12:58 PM

## 2014-02-24 NOTE — Progress Notes (Signed)
Pt placed on cardiac monitoring.  Rectal thermometer ordered as well as Lawyerb[employer] hugger.  Pt comfortable, vital signs otherwise normal.  Pt is oriented to room and unit.  Bed alarm on.  Pt has no complaints at this time.  Will continue to monitor. Vanice Sarahhompson, Keyante Durio L

## 2014-02-24 NOTE — Progress Notes (Signed)
Hypoglycemic Event  CBG: 43  Treatment: 15 GM carbohydrate snack  Symptoms: None  Follow-up CBG: Time:1715 CBG Result:44  Possible Reasons for Event: Inadequate meal intake  Comments/MD notified:patient given more orange jiece and graham crackers. Will recheck at 1730    [name], [name]  Remember to initiate Hypoglycemia Order Set & complete

## 2014-02-24 NOTE — ED Notes (Signed)
[facility] EMS was called for general sick. [facility]. EMS reports Glucose of 42 at 0940. Gave 1 amp D50. Rechecked at 0955, Glucose 147.

## 2014-02-24 NOTE — Progress Notes (Signed)
Hypoglycemic Event  CBG: 44  Treatment: 15 GM carbohydrate snack  Symptoms: None  Follow-up CBG: Time:1730 CBG Result:53  Possible Reasons for Event: Inadequate meal intake  Comments/MD notified:d50 given, will recheck in 15 min    Vanice Sarahhompson, Tryphena Perkovich L  Remember to initiate Hypoglycemia Order Set & complete

## 2014-02-24 NOTE — Progress Notes (Signed)
MD paged again regarding pt's temperature.  No new orders at this time.  MD says to continue with blankets, heat packs, and k pad while waiting for bair hugger.  Will continue to monitor. Vanice Sarahhompson, Beaux Verne L

## 2014-02-24 NOTE — H&P (Addendum)
[facility] History and Physical  [name] ZOX:[id number] DOB: 1935-06-23 DOA: 02/24/2014  Referring physician: Dr Wilkie AyeHorton PCP: Josue HectorNYLAND,LEONARD ROBERT, MD   Chief Complaint: worsening pedal edema since discharge  HPI: Sierra PanderLorine E Leverich is a 78 y.o. female with prior h/o CKD  Stage 4, recent cellulitis, diabetes mellitus, paroxysmal afib, PVD, recently discharged from the hospital on 10/10 after being treated for cellulitis and ARF, comes in for worsening pedal edema, and persistent cellulitis changes. She denies any chest pain or sob. She also reports decreased urination over the last 4 days. On arrival to ED, she was found to be in acute renal failure and hyponatremia, hypothermia. She is referred to medical service for admission.     Review of Systems:  Constitutional:  No weight loss, night sweats, Fevers, chills, fatigue.  HEENT:  No headaches, Difficulty swallowing,Tooth/dental problems,Sore throat,  No sneezing, itching, ear ache, nasal congestion, post nasal drip,  Cardio-vascular:  No chest pain, Orthopnea, PND, swelling in lower extremities, anasarca, dizziness, palpitations  GI:  No heartburn, indigestion, abdominal pain, nausea, vomiting, diarrhea, change in bowel habits, loss of appetite  Resp:  No shortness of breath with exertion or at rest. No excess mucus, no productive cough, No non-productive cough, No coughing up of blood.No change in color of mucus.No wheezing.No chest wall deformity  Skin:  no rash or lesions.  GU:  Decreased urination.  Musculoskeletal:  WORSENING PEDAL EDEMA  Past Medical History  Diagnosis Date  . Paroxysmal atrial fibrillation   . PVD (peripheral vascular disease)     status post fem-fem bypass in sept/2009  . Junctional bradycardia     in the setting of hyperkalemia in the past  . GI bleeding   . Coronary artery disease   . Mitral regurgitation     status post annuloplasty ring.  . Pulmonary hypertension     moderate  .  Hypertension   . Diabetes mellitus   . Renal insufficiency     in feb. 2008, acute on chronic   Past Surgical History  Procedure Laterality Date  . Coronary artery bypass graft      LIMA to the LAD, SVG to PDA, SVG to posterolateral, SVG to diagonal 2000  . Cholecystectomy    . Amputation      partial of right thumb   Social History:  reports that she quit smoking about 15 years ago. Her smoking use included Cigarettes. She smoked 0.00 packs per day. She does not have any smokeless tobacco history on file. She reports that she does not drink alcohol or use illicit drugs.  Allergies  Allergen Reactions  . Sulfonamide Derivatives Itching  . Codeine Rash    Family History  Problem Relation Age of Onset  . Cancer Mother     deceased  . Heart attack Father     deceased     Prior to Admission medications   Medication Sig Start Date End Date Taking? Authorizing Provider  amLODipine (NORVASC) 10 MG tablet Take 10 mg by mouth every morning.   Yes Historical Provider, MD  doxycycline (VIBRA-TABS) 100 MG tablet Take 1 tablet (100 mg total) by mouth every 12 (twelve) hours. 02/20/14  Yes Henderson CloudEstela Y Hernandez Acosta, MD  furosemide (LASIX) 80 MG tablet Take 80-160 mg by mouth 2 (two) times daily as needed for fluid (Takes 1 tablet daily, but takes a second dose later in the day if needed.).    Yes Historical Provider, MD  glipiZIDE (GLUCOTROL XL) 10 MG 24  hr tablet Take 10 mg by mouth 2 (two) times daily.   Yes Historical Provider, MD  mometasone (ELOCON) 0.1 % cream Apply 1 application topically 2 (two) times daily.  02/11/14  Yes Historical Provider, MD   Physical Exam: Filed Vitals:   02/24/14 1330 02/24/14 1339 02/24/14 1400 02/24/14 1525  BP: 103/53 103/53 119/50 125/41  Pulse: 69 63 65 70  Temp:  93.7 F (34.3 C)    TempSrc:  Rectal    Resp: 14 22 20 20   Height:      Weight:      SpO2: 100% 100% 100% 100%    Wt Readings from Last 3 Encounters:  02/24/14 74.1 kg (163 lb 5.8  oz)  02/19/14 74.1 kg (163 lb 5.8 oz)  04/29/13 68.04 kg (150 lb)    General:  Appears calm and comfortable Eyes: PERRL, normal lids, irises & conjunctiva Neck: no LAD, masses or thyromegaly Cardiovascular: RRR, no m/r/g. No LE edema. Respiratory: CTA bilaterally, no w/r/r. Normal respiratory effort. Abdomen: soft, ntnd Musculoskeletal: bilaterla 3 + edem aof the legs with superficial ulcers Sacral decubitus ulcer present.  Neurologic: grossly non-focal.          Labs on Admission:  Basic Metabolic Panel:  Recent Labs Lab 02/18/14 0648 02/19/14 0626 02/20/14 0558 02/24/14 1105 02/24/14 1123  NA 131* 126* 125* 126* 122*  K 3.6* 4.0 3.9 4.3 4.1  CL 93* 90* 88* 87* 93*  CO2 18* 19 16* 15*  --   GLUCOSE 44* 128* 120* 94 95  BUN 107* 109* 108* 126* 128*  CREATININE 3.28* 3.53* 3.83* 4.35* 4.80*  CALCIUM 7.2* 7.0* 7.0* 7.4*  --   MG 2.1  --   --   --   --    Liver Function Tests:  Recent Labs Lab 02/24/14 1105  AST 48*  ALT 45*  ALKPHOS 190*  BILITOT 0.7  PROT 5.3*  ALBUMIN 2.0*   No results found for this basename: LIPASE, AMYLASE,  in the last 168 hours No results found for this basename: AMMONIA,  in the last 168 hours CBC:  Recent Labs Lab 02/18/14 0648 02/19/14 0626 02/24/14 1105 02/24/14 1123  WBC 3.3* 3.2* 10.3  --   NEUTROABS  --   --  9.6*  --   HGB 11.1* 10.8* 11.6* 12.9  HCT 32.3* 31.1* 32.8* 38.0  MCV 79.4 78.7 77.2*  --   PLT 182 169 242  --    Cardiac Enzymes:  Recent Labs Lab 02/24/14 1105  TROPONINI <0.30    BNP (last 3 results) No results found for this basename: PROBNP,  in the last 8760 hours CBG:  Recent Labs Lab 02/20/14 1219 02/24/14 1107 02/24/14 1657 02/24/14 1716 02/24/14 1732  GLUCAP 134* 98 43* 44* 53*    Radiological Exams on Admission: Dg Chest Portable 1 View  02/24/2014   CLINICAL DATA:  Fatigue, nausea, and vomiting.  EXAM: PORTABLE CHEST - 1 VIEW  COMPARISON:  Two-view chest 06/15/2008  FINDINGS: The  heart is enlarged. The size is exaggerated by low lung volumes. Atherosclerotic changes are again noted at the aortic arch. Diffuse interstitial coarsening is chronic. This is exaggerated by the low lung volumes. No focal airspace disease is evident. The visualized soft tissues and bony thorax are unremarkable.  IMPRESSION: 1. Low lung volumes. 2. Stable cardiomegaly without failure.   Electronically Signed   By: Gennette Pachris  Mattern M.D.   On: 02/24/2014 11:17    EKG: AFIB with rate 58/min  Assessment/Plan  Active Problems:   MITRAL REGURGITATION   Essential hypertension   Acute on chronic renal failure   DM type 2 causing renal disease   CKD (chronic kidney disease) stage 4, GFR 15-29 ml/min   SIRS (systemic inflammatory response syndrome)   Hypothermia, left shift , worsening of cellulitis ? SIRS: Admitted to telemetry.  Started on IV vancomycin and zosyn for worsening cellulitis.  Wound care consult for recommendations.  Daily dressing changes.  Blood cultures ordered    Acute on CKD stage 4/ anuria: - renal consulted, to see if she needs HD, this hospitalization . Echocardiogram ordered to further evaluate the worsening pedal edema.   -potassium within normal limits - US RENAL, urine electrolytes, and UA ordered and pending.   Diabetes Mellitus: - pt had an episode of hypoglycemia last admission and was advised to stop oral hypoglycmic meds. But she continued to takethem.  Hold glucotrol.   Hypertension: controlled.    Hyponatremia: probably from fluid overload.  - one dose of lasix given.  - repeat in am.    DVT prophylaxis.   Code Status: full code DVT Prophylaxis: Family Communication: family at bedside Disposition Plan: admit to tele Time spent: 70 min  [facility] Pager [phone]

## 2014-02-24 NOTE — Progress Notes (Signed)
Rectal thermometer sent from materials and temp is now 94.3.  MD notified.  Have called materials for [employer] hugger machine twice. Vanice Sarahhompson, Kaydan Wong L

## 2014-02-24 NOTE — Progress Notes (Signed)
Pt blood sugar, after d50 given, is now 143. Vanice Sarahhompson, Farra Nikolic L

## 2014-02-25 DIAGNOSIS — L03116 Cellulitis of left lower limb: Secondary | ICD-10-CM

## 2014-02-25 DIAGNOSIS — L03115 Cellulitis of right lower limb: Secondary | ICD-10-CM

## 2014-02-25 DIAGNOSIS — N185 Chronic kidney disease, stage 5: Secondary | ICD-10-CM

## 2014-02-25 DIAGNOSIS — N184 Chronic kidney disease, stage 4 (severe): Secondary | ICD-10-CM

## 2014-02-25 DIAGNOSIS — E871 Hypo-osmolality and hyponatremia: Secondary | ICD-10-CM

## 2014-02-25 DIAGNOSIS — E1121 Type 2 diabetes mellitus with diabetic nephropathy: Secondary | ICD-10-CM

## 2014-02-25 LAB — GLUCOSE, CAPILLARY
GLUCOSE-CAPILLARY: 290 mg/dL — AB (ref 70–99)
Glucose-Capillary: 44 mg/dL — CL (ref 70–99)
Glucose-Capillary: 92 mg/dL (ref 70–99)
Glucose-Capillary: 97 mg/dL (ref 70–99)
Glucose-Capillary: 114 mg/dL — ABNORMAL HIGH (ref 70–99)

## 2014-02-25 LAB — BASIC METABOLIC PANEL
Anion gap: 22 — ABNORMAL HIGH (ref 5–15)
BUN: 123 mg/dL — AB (ref 6–23)
CHLORIDE: 90 meq/L — AB (ref 96–112)
CO2: 14 mEq/L — ABNORMAL LOW (ref 19–32)
Calcium: 7.1 mg/dL — ABNORMAL LOW (ref 8.4–10.5)
Creatinine, Ser: 4.33 mg/dL — ABNORMAL HIGH (ref 0.50–1.10)
GFR calc non Af Amer: 9 mL/min — ABNORMAL LOW (ref >90)
GFR, EST AFRICAN AMERICAN: 11 mL/min — AB (ref >90)
Glucose, Bld: 56 mg/dL — ABNORMAL LOW (ref 70–99)
POTASSIUM: 4.3 meq/L (ref 3.7–5.3)
Sodium: 126 mEq/L — ABNORMAL LOW (ref 137–147)

## 2014-02-25 LAB — CLOSTRIDIUM DIFFICILE BY PCR: CDIFFPCR: NEGATIVE

## 2014-02-25 LAB — URINE CULTURE
Colony Count: NO GROWTH
Culture: NO GROWTH

## 2014-02-25 LAB — CBC
HEMATOCRIT: 29.5 % — AB (ref 36.0–46.0)
HEMOGLOBIN: 10.4 g/dL — AB (ref 12.0–15.0)
MCH: 27.2 pg (ref 26.0–34.0)
MCHC: 35.3 g/dL (ref 30.0–36.0)
MCV: 77 fL — ABNORMAL LOW (ref 78.0–100.0)
Platelets: 226 10*3/uL (ref 150–400)
RBC: 3.83 MIL/uL — ABNORMAL LOW (ref 3.87–5.11)
RDW: 14.3 % (ref 11.5–15.5)
WBC: 13 10*3/uL — ABNORMAL HIGH (ref 4.0–10.5)

## 2014-02-25 MED ORDER — DEXTROSE 10 % IV SOLN
INTRAVENOUS | Status: DC
  Administered 2014-02-25 – 2014-02-26 (×2): via INTRAVENOUS

## 2014-02-25 MED ORDER — COLLAGENASE 250 UNIT/GM EX OINT
TOPICAL_OINTMENT | Freq: Every day | CUTANEOUS | Status: DC
  Administered 2014-02-25 – 2014-03-04 (×8): via TOPICAL
  Filled 2014-02-25 (×3): qty 30

## 2014-02-25 MED ORDER — ASPIRIN EC 81 MG PO TBEC
81.0000 mg | DELAYED_RELEASE_TABLET | Freq: Every day | ORAL | Status: DC
  Administered 2014-02-25 – 2014-03-03 (×7): 81 mg via ORAL
  Filled 2014-02-25 (×9): qty 1

## 2014-02-25 MED ORDER — HEPARIN SODIUM (PORCINE) 5000 UNIT/ML IJ SOLN
5000.0000 [IU] | Freq: Three times a day (TID) | INTRAMUSCULAR | Status: DC
  Administered 2014-02-25 – 2014-03-03 (×15): 5000 [IU] via SUBCUTANEOUS
  Filled 2014-02-25 (×26): qty 1

## 2014-02-25 NOTE — Consult Note (Addendum)
WOC wound consult note  Reason for Consult:Consult requested for buttocks and bilat legs. Pt states she is followed as an outpatient for her right leg wound; she is familiar to WOC from previous admission, refer to progress notes on 10/7. Wound type: Left ankle with 3 areas of partial thickness wounds; 1X1X.1cm, .1X.1X.1cm, 1.2X1.2X.1cm All are pink and moist with minimal amt yellow drainage and no odor. Left foot with .1X.1X.1cm area with small amt pink drainage. Buttocks with previous areas of patchy areas of partial thickness skin loss have healed. Red moist macerated skin surrounding buttocks and rectum; appearance consistent with moisture associated skin damage R/T frequent loose stools.  Right leg with chronic full thickness wound; 8X4.5X.1cm; 100% yellow slough. No odor , mod  amt yellow drainage. Surrounding skin with 3 areas of full thickness thickness wounds; 3X3X.1cm, 1X1X.1cm , all with mod amt yellow drainage and 100% yellow slough. Right foot with small puncture wound and mod amt yellow drainage. Dressing procedure/placement/frequency: Barrier cream to protect buttocks and repel moisture. Foam dressing to sacrum, left leg and right foot to promote healing. Continue previous plan of care with Santyl to chemically debride nonviable tissue to right leg wounds. Pt can resume follow-up with outpatient physician after discharge. Discussed plan of care and she verbalizes understanding.  Please re-consult if further assistance is needed. Thank-you,  Cammie Mcgeeawn Jaequan Propes MSN, RN, CWOCN, NewbergWCN-AP, CNS  [phone]

## 2014-02-25 NOTE — Progress Notes (Signed)
[facility] PROGRESS NOTE  [name] IBB:[id number] DOB: 1936/04/04 DOA: 02/24/2014 PCP: Sherrie Mustache, MD  Assessment/Plan: 78 y.o. female with PMH of CAD s/p CABG, ? S/p MVR, DM, PAF, PAD, CKD 4-5, Gout, Recent cellulitis, recently discharged from the hospital on 10/10 after being treated for cellulitis and ARF, comes in for worsening pedal edema, and persistent cellulitis changes, and diarrhea   1. Chronic cellulitis, dermatitis, skin ulcers due to chronic leg edema, PAD -on atx; blood cultures NGTD; down titrate atx as possible   2. CKD 4-5, Hyponatremia, AG met acidosis likely due CKD; US renal: Atrophic kidneys  -d/w nephrology; will evaluate for HD; diuretics on hold   3. CAD s/p CABG, ? S/p MVR; patient denies any chest pain; cont monitor, resume home regimen  4. PAF, patient declined anticoagulation; HR stable; not on BB; start ASA 5. Probable CHF; pend echo; clinically stable;  6. DM, hypoglycemia likely due to PO glipizide; started d10 %drip; d/c glipizide; monitor  7. Diarrhea, resolving;  c diff negative;    Code Status: full Family Communication: d/w patient, called family Bertice, Risse Other [phone] no answer; try later   (indicate person spoken with, relationship, and if by phone, the number) Disposition Plan: pend clinical improvement    Consultants:  Nephrology   Procedures:  none  Antibiotics:  Zosyn 10/14<<  vanc 10/14<<  Metronidazole 10/15<<<   (indicate start date, and stop date if known)  HPI/Subjective: alert  Objective: Filed Vitals:   02/25/14 1016  BP: 116/60  Pulse: 74  Temp: 97.7 F (36.5 C)  Resp: 20    Intake/Output Summary (Last 24 hours) at 02/25/14 1309 Last data filed at 02/25/14 0017  Gross per 24 hour  Intake    100 ml  Output      0 ml  Net    100 ml   Filed Weights   02/24/14 1130  Weight: 74.1 kg (163 lb 5.8 oz)    Exam:   General:  alert  Cardiovascular: s1,s2 +JVD  Respiratory:  few crackles   Abdomen: soft, nt,dn   Musculoskeletal: no chronic dermatitis, edema, ulcers    Data Reviewed: Basic Metabolic Panel:  Recent Labs Lab 02/19/14 0626 02/20/14 0558 02/24/14 1105 02/24/14 1123 02/25/14 0443  NA 126* 125* 126* 122* 126*  K 4.0 3.9 4.3 4.1 4.3  CL 90* 88* 87* 93* 90*  CO2 19 16* 15*  --  14*  GLUCOSE 128* 120* 94 95 56*  BUN 109* 108* 126* 128* 123*  CREATININE 3.53* 3.83* 4.35* 4.80* 4.33*  CALCIUM 7.0* 7.0* 7.4*  --  7.1*   Liver Function Tests:  Recent Labs Lab 02/24/14 1105  AST 48*  ALT 45*  ALKPHOS 190*  BILITOT 0.7  PROT 5.3*  ALBUMIN 2.0*   No results found for this basename: LIPASE, AMYLASE,  in the last 168 hours No results found for this basename: AMMONIA,  in the last 168 hours CBC:  Recent Labs Lab 02/19/14 0626 02/24/14 1105 02/24/14 1123 02/25/14 0443  WBC 3.2* 10.3  --  13.0*  NEUTROABS  --  9.6*  --   --   HGB 10.8* 11.6* 12.9 10.4*  HCT 31.1* 32.8* 38.0 29.5*  MCV 78.7 77.2*  --  77.0*  PLT 169 242  --  226   Cardiac Enzymes:  Recent Labs Lab 02/24/14 1105  TROPONINI <0.30   BNP (last 3 results) No results found for this basename: PROBNP,  in the last 8760 hours CBG:  Recent  Labs Lab 02/24/14 1751 02/24/14 2226 02/25/14 0803 02/25/14 0854 02/25/14 1148  GLUCAP 143* 121* 44* 92 97    Recent Results (from the past 240 hour(s))  CLOSTRIDIUM DIFFICILE BY PCR     Status: None   Collection Time    02/17/14  1:04 AM      Result Value Ref Range Status   C difficile by pcr NEGATIVE  NEGATIVE Final  CULTURE, BLOOD (ROUTINE X 2)     Status: None   Collection Time    02/24/14 11:05 AM      Result Value Ref Range Status   Specimen Description BLOOD RIGHT ARM   Final   Special Requests BOTTLES DRAWN AEROBIC AND ANAEROBIC 10ML   Final   Culture  Setup Time     Final   Value: 02/24/2014 17:27     Performed at [facility]   Culture     Final   Value:        BLOOD CULTURE RECEIVED NO GROWTH  TO DATE CULTURE WILL BE HELD FOR 5 DAYS BEFORE ISSUING A FINAL NEGATIVE REPORT     Performed at [facility]   Report Status PENDING   Incomplete  CULTURE, BLOOD (ROUTINE X 2)     Status: None   Collection Time    02/24/14 11:45 AM      Result Value Ref Range Status   Specimen Description BLOOD LEFT ARM   Final   Special Requests BOTTLES DRAWN AEROBIC AND ANAEROBIC 10ML   Final   Culture  Setup Time     Final   Value: 02/24/2014 17:29     Performed at [facility]   Culture     Final   Value:        BLOOD CULTURE RECEIVED NO GROWTH TO DATE CULTURE WILL BE HELD FOR 5 DAYS BEFORE ISSUING A FINAL NEGATIVE REPORT     Performed at [facility]   Report Status PENDING   Incomplete  CLOSTRIDIUM DIFFICILE BY PCR     Status: None   Collection Time    02/25/14  4:56 AM      Result Value Ref Range Status   C difficile by pcr NEGATIVE  NEGATIVE Final     Studies: US Renal  02/24/2014   CLINICAL DATA:  Increased creatinine, diabetes, hypertension, chronic renal failure  EXAM: RENAL/URINARY TRACT ULTRASOUND COMPLETE  COMPARISON:  None.  FINDINGS: Right Kidney:  Length: 8.9 cm. Increased renal cortical echogenicity. Multiple shadowing echogenic foci consistent with nephrolithiasis with the largest measuring 5 mm. No mass or hydronephrosis visualized.  Left Kidney:  Length: 8.9 cm. Increased renal cortical echogenicity. There is a 2.3 x 2.1 x 2.5 cm anechoic left renal mass most consistent with a cyst. There are a few echogenic foci in the left kidney likely reflecting nephrolithiasis. No hydronephrosis visualized.  Bladder:  Appears normal for degree of bladder distention.  IMPRESSION: 1. Bilateral atrophic kidneys and increased renal cortical echogenicity as can be seen with medical renal disease. 2. Bilateral nephrolithiasis.   Electronically Signed   By: Kathreen Devoid   On: 02/24/2014 21:38   Dg Chest Portable 1 View  02/24/2014   CLINICAL DATA:  Fatigue, nausea, and  vomiting.  EXAM: PORTABLE CHEST - 1 VIEW  COMPARISON:  Two-view chest 06/15/2008  FINDINGS: The heart is enlarged. The size is exaggerated by low lung volumes. Atherosclerotic changes are again noted at the aortic arch. Diffuse interstitial coarsening is chronic. This is exaggerated  by the low lung volumes. No focal airspace disease is evident. The visualized soft tissues and bony thorax are unremarkable.  IMPRESSION: 1. Low lung volumes. 2. Stable cardiomegaly without failure.   Electronically Signed   By: Lawrence Santiago M.D.   On: 02/24/2014 11:17    Scheduled Meds: . collagenase   Topical Daily  . enoxaparin (LOVENOX) injection  30 mg Subcutaneous Q24H  . insulin aspart  0-9 Units Subcutaneous TID WC  . piperacillin-tazobactam (ZOSYN)  IV  2.25 g Intravenous 4 times per day  . [START ON 02/26/2014] vancomycin  1,000 mg Intravenous Q48H   Continuous Infusions: . dextrose 50 mL/hr at 02/25/14 1735    Active Problems:   MITRAL REGURGITATION   Essential hypertension   Acute on chronic renal failure   DM type 2 causing renal disease   CKD (chronic kidney disease) stage 4, GFR 15-29 ml/min   SIRS (systemic inflammatory response syndrome)   Hypothermia    Time spent: >35 minutes     Kinnie Feil  [facility] Pager [phone]. If 7PM-7AM, please contact night-coverage at [email or web address], password [facility] 02/25/2014, 1:09 PM  LOS: 1 day

## 2014-02-25 NOTE — Progress Notes (Addendum)
02/25/2014 4:52 PM Hemodialysis Outpatient Note; requested by Dr Hyman HopesWebb to begin the CLIP process on this patient. Patient clearly stated that she wanted to remain in the [place] area for dialysis as she does not have the resources to travel outside city. Pertaining to travel patient states she does not know how she will get to and from the dialysis unit . I will attempt to involve the social worker to present possible options for patient regarding travel. In the meantime we will need to contact the [facility] unit in [place] for placement schedule. Thank you. Tilman NeatKrzywonos, Meyli Boice H 02/25/2014 5:07 PM Addendum: if patient does become a patient of the [facility] group she will need to have a PPD placed and read before they will accept her. Thank you. Tilman NeatKrzywonos, Luismanuel Corman H

## 2014-02-25 NOTE — Consult Note (Signed)
Referring Provider: No ref. provider found Primary Care Physician:  Josue HectorNYLAND,LEONARD ROBERT, MD Primary Nephrologist:  Dr. Kathrene BongoGoldsborough  Reason for Consultation:   Known CKD stage 4/5   HPI: Known chronic renal failure with multiple comorbid conditions including diabetes and peripheral vascular disease. She had cellulitis and worsening volume overload and readmitted to hospital. She has chronic lower extremities skin ulcers. She has seen Dr Kathrene BongoGoldsborough in the past with advanced renal disease and has had education but no access.   Past Medical History  Diagnosis Date  . Paroxysmal atrial fibrillation   . PVD (peripheral vascular disease)     status post fem-fem bypass in sept/2009  . Junctional bradycardia     in the setting of hyperkalemia in the past  . GI bleeding   . Coronary artery disease   . Mitral regurgitation     status post annuloplasty ring.  . Pulmonary hypertension     moderate  . Hypertension   . Diabetes mellitus   . Renal insufficiency     in feb. 2008, acute on chronic    Past Surgical History  Procedure Laterality Date  . Coronary artery bypass graft      LIMA to the LAD, SVG to PDA, SVG to posterolateral, SVG to diagonal 2000  . Cholecystectomy    . Amputation      partial of right thumb    Prior to Admission medications   Medication Sig Start Date End Date Taking? Authorizing Provider  amLODipine (NORVASC) 10 MG tablet Take 10 mg by mouth every morning.   Yes Historical Provider, MD  doxycycline (VIBRA-TABS) 100 MG tablet Take 1 tablet (100 mg total) by mouth every 12 (twelve) hours. 02/20/14  Yes Henderson CloudEstela Y Hernandez Acosta, MD  furosemide (LASIX) 80 MG tablet Take 80-160 mg by mouth 2 (two) times daily as needed for fluid (Takes 1 tablet daily, but takes a second dose later in the day if needed.).    Yes Historical Provider, MD  glipiZIDE (GLUCOTROL XL) 10 MG 24 hr tablet Take 10 mg by mouth 2 (two) times daily.   Yes Historical Provider, MD  mometasone  (ELOCON) 0.1 % cream Apply 1 application topically 2 (two) times daily.  02/11/14  Yes Historical Provider, MD    Current Facility-Administered Medications  Medication Dose Route Frequency Provider Last Rate Last Dose  . acetaminophen (TYLENOL) tablet 650 mg  650 mg Oral Q6H PRN Kathlen ModyVijaya Akula, MD   650 mg at 02/25/14 1224   Or  . acetaminophen (TYLENOL) suppository 650 mg  650 mg Rectal Q6H PRN Kathlen ModyVijaya Akula, MD      . aspirin EC tablet 81 mg  81 mg Oral Daily Esperanza SheetsUlugbek N Buriev, MD      . collagenase (SANTYL) ointment   Topical Daily Esperanza SheetsUlugbek N Buriev, MD      . dextrose 10 % infusion   Intravenous Continuous Esperanza SheetsUlugbek N Buriev, MD 50 mL/hr at 02/25/14 0819    . insulin aspart (novoLOG) injection 0-9 Units  0-9 Units Subcutaneous TID WC Kathlen ModyVijaya Akula, MD      . piperacillin-tazobactam (ZOSYN) IVPB 2.25 g  2.25 g Intravenous 4 times per day Thuy Dien Dang, RPH   2.25 g at 02/25/14 1225  . [START ON 02/26/2014] vancomycin (VANCOCIN) IVPB 1000 mg/200 mL premix  1,000 mg Intravenous Q48H Thuy Dien Dang, RPH        Allergies as of 02/24/2014 - Review Complete 02/24/2014  Allergen Reaction Noted  . Sulfonamide derivatives Itching   .  Codeine Rash 02/24/2014    Family History  Problem Relation Age of Onset  . Cancer Mother     deceased  . Heart attack Father     deceased    History   Social History  . Marital Status: Widowed    Spouse Name: N/A    Number of Children: 2  . Years of Education: N/A   Occupational History  . disabled    Social History Main Topics  . Smoking status: Former Smoker    Types: Cigarettes    Quit date: 05/14/1998  . Smokeless tobacco: Not on file  . Alcohol Use: No  . Drug Use: No  . Sexual Activity: Not on file   Other Topics Concern  . Not on file   Social History Narrative  . No narrative on file    Review of Systems: Gen: + anorexia, fatigue, weakness, malaise,  HEENT: No visual complaints, CV: + , orthopnea, PND, peripheral edema, and  claudication. Resp: + dyspnea at rest, dyspnea with exercise,  GI: Denies vomiting blood, jaundice, and fecal incontinence.   Denies dysphagia or odynophagia. GU : Denies urinary burning, blood in urine, urinary frequency, urinary hesitancy, nocturnal urination, and urinary incontinence.  No renal calculi. MS: Denies joint pain, limitation of movement, and swelling, stiffness, low back pain, extremity pain. Denies muscle weakness, cramps, atrophy.  No use of non steroidal antiinflammatory drugs. Derm: + unhealing ulcers.  Psych: Denies depression, anxiety, memory loss, suicidal ideation, hallucinations, paranoia, and confusion. Heme: Denies bruising, bleeding, and enlarged lymph nodes. Neuro: No headache.  No diplopia. No dysarthria.  No dysphasia.  No history of CVA.  No Seizures. No paresthesias.  No weakness. Endocrine Diabetes.  No Thyroid disease.  No Adrenal disease.  Physical Exam: Vital signs in last 24 hours: Temp:  [94.3 F (34.6 C)-97.7 F (36.5 C)] 97.4 F (36.3 C) (10/15 1311) Pulse Rate:  [58-74] 71 (10/15 1311) Resp:  [16-22] 22 (10/15 1311) BP: (103-128)/(36-60) 128/60 mmHg (10/15 1311) SpO2:  [92 %-100 %] 98 % (10/15 1311) Last BM Date: 02/25/14 General:   Poor condition with multiple medical problems Head:  Normocephalic and atraumatic. Eyes:  Sclera clear, no icterus.   Conjunctiva pink. Ears:  Normal auditory acuity. Nose:  No deformity, discharge,  or lesions. Mouth:  No deformity or lesions, dentition normal. Neck:  Supple; no masses or thyromegaly. JVP not elevated Lungs:  +crackles, No acute distress. Heart:  Irregular  Abdomen:  Soft, nontender and nondistended. No masses, hepatosplenomegaly or hernias noted. Normal bowel sounds, without guarding, and without rebound.   Msk:  Symmetrical without gross deformities. Normal posture. Pulses:  No carotid, renal, femoral bruits. DP and PT symmetrical and equal Extremities:  Without clubbing or edema. Neurologic:   Alert and  oriented x4;  grossly normal neurologically. Skin:  No edema but dressings over wounds Cervical Nodes:  No significant cervical adenopathy. Psych:  Alert and cooperative. Normal mood and affect.  Intake/Output from previous day: 10/14 0701 - 10/15 0700 In: 100 [IV Piggyback:100] Out: 400 [Urine:400] Intake/Output this shift:    Lab Results:  Recent Labs  02/24/14 1105 02/24/14 1123 02/25/14 0443  WBC 10.3  --  13.0*  HGB 11.6* 12.9 10.4*  HCT 32.8* 38.0 29.5*  PLT 242  --  226   BMET  Recent Labs  02/24/14 1105 02/24/14 1123 02/25/14 0443  NA 126* 122* 126*  K 4.3 4.1 4.3  CL 87* 93* 90*  CO2 15*  --  14*  GLUCOSE 94 95 56*  BUN 126* 128* 123*  CREATININE 4.35* 4.80* 4.33*  CALCIUM 7.4*  --  7.1*   LFT  Recent Labs  02/24/14 1105  PROT 5.3*  ALBUMIN 2.0*  AST 48*  ALT 45*  ALKPHOS 190*  BILITOT 0.7   PT/INR No results found for this basename: LABPROT, INR,  in the last 72 hours Hepatitis Panel No results found for this basename: HEPBSAG, HCVAB, HEPAIGM, HEPBIGM,  in the last 72 hours  Studies/Results: US Renal  02/24/2014   CLINICAL DATA:  Increased creatinine, diabetes, hypertension, chronic renal failure  EXAM: RENAL/URINARY TRACT ULTRASOUND COMPLETE  COMPARISON:  None.  FINDINGS: Right Kidney:  Length: 8.9 cm. Increased renal cortical echogenicity. Multiple shadowing echogenic foci consistent with nephrolithiasis with the largest measuring 5 mm. No mass or hydronephrosis visualized.  Left Kidney:  Length: 8.9 cm. Increased renal cortical echogenicity. There is a 2.3 x 2.1 x 2.5 cm anechoic left renal mass most consistent with a cyst. There are a few echogenic foci in the left kidney likely reflecting nephrolithiasis. No hydronephrosis visualized.  Bladder:  Appears normal for degree of bladder distention.  IMPRESSION: 1. Bilateral atrophic kidneys and increased renal cortical echogenicity as can be seen with medical renal disease. 2. Bilateral  nephrolithiasis.   Electronically Signed   By: Elige Ko   On: 02/24/2014 21:38   Dg Chest Portable 1 View  02/24/2014   CLINICAL DATA:  Fatigue, nausea, and vomiting.  EXAM: PORTABLE CHEST - 1 VIEW  COMPARISON:  Two-view chest 06/15/2008  FINDINGS: The heart is enlarged. The size is exaggerated by low lung volumes. Atherosclerotic changes are again noted at the aortic arch. Diffuse interstitial coarsening is chronic. This is exaggerated by the low lung volumes. No focal airspace disease is evident. The visualized soft tissues and bony thorax are unremarkable.  IMPRESSION: 1. Low lung volumes. 2. Stable cardiomegaly without failure.   Electronically Signed   By: Gennette Pac M.D.   On: 02/24/2014 11:17    Assessment/Plan:  Advanced stage 5 chronic renal disease . Discussed with Dr Kathrene Bongo  She would like permanent access and also catheter placed to start dialysis this admission. Will have CLIP process started. Patient is agreeable to starting dialysis  Metabolic acidosis will start dialysis  Anemia   Hb greater than 10  Bones check PTH   Consult VVS   LOS: 1 [name] W @TODAY @1 :41 PM

## 2014-02-25 NOTE — Consult Note (Signed)
[facility]  Patient name: [name] MRN: [id number] DOB: 1936/05/12 Sex: female  REASON FOR CONSULT: Needs catheter and permanent access. Consult from the nephrologist.  HPI: Sierra PanderLorine E Roa is a 78 y.o. female who was admitted in on 02/24/2014 with worsening lower extremity edema. She had recently been admitted to the hospital with cellulitis of her lower extremities and worsening chronic kidney disease. She was discharged from the hospital on 02/20/2014 but readmitted with worsening edema. On admission, she was found to be in acute renal failure. She was seen in consultation by nephrology and is felt to have advanced stage V chronic kidney disease. She needs to begin dialysis. This reason vascular surgery was consult.  She has multiple abrasions in her lower extremities and also her right upper arm. She states that these are related to a fall. She's also had cellulitis of both lower extremities.  She has previously undergone a right to left femoral-femoral bypass and left femoral popliteal bypass in 2009.  She does admit to dyspnea on exertion and also some orthopnea.  Past Medical History  Diagnosis Date  . Paroxysmal atrial fibrillation   . PVD (peripheral vascular disease)     status post fem-fem bypass in sept/2009  . Junctional bradycardia     in the setting of hyperkalemia in the past  . GI bleeding   . Coronary artery disease   . Mitral regurgitation     status post annuloplasty ring.  . Pulmonary hypertension     moderate  . Hypertension   . Diabetes mellitus   . Renal insufficiency     in feb. 2008, acute on chronic   Family History  Problem Relation Age of Onset  . Cancer Mother     deceased  . Heart attack Father     deceased   SOCIAL HISTORY: History  Substance Use Topics  . Smoking status: Former Smoker    Types: Cigarettes    Quit date: 05/14/1998  . Smokeless tobacco: Not on file  . Alcohol Use: No    Allergies    Allergen Reactions  . Sulfonamide Derivatives Itching  . Codeine Rash   REVIEW OF SYSTEMS: Arly.Keller[X ] denotes positive finding; [  ] denotes negative finding CARDIOVASCULAR:  [ ]  chest pain   [ ]  chest pressure   [ ]  palpitations   Arly.Keller[X ] orthopnea   Arly.Keller[X ] dyspnea on exertion   [ ]  claudication   [ ]  rest pain   [ ]  DVT   [ ]  phlebitis PULMONARY:   [ ]  productive cough   [ ]  asthma   [ ]  wheezing NEUROLOGIC:   [ ]  weakness  [ ]  paresthesias  [ ]  aphasia  [ ]  amaurosis  [ ]  dizziness HEMATOLOGIC:   [ ]  bleeding problems   [ ]  clotting disorders MUSCULOSKELETAL:  Arly.Keller[X ] joint pain   [ ]  joint swelling [ ]  leg swelling GASTROINTESTINAL: [ ]   blood in stool  [ ]   hematemesis GENITOURINARY:  [ ]   dysuria  [ ]   hematuria PSYCHIATRIC:  [ ]  history of major depression INTEGUMENTARY:  [ ]  rashes  Arly.Keller[X ] ulcers CONSTITUTIONAL:  [ ]  fever   [ ]  chills  PHYSICAL EXAM: Filed Vitals:   02/24/14 2331 02/25/14 0458 02/25/14 1016 02/25/14 1311  BP: 107/42 103/46 116/60 128/60  Pulse: 70 68 74 71  Temp: 97.5 F (36.4 C) 97.5 F (36.4 C) 97.7 F (36.5 C) 97.4 F (36.3  C)  TempSrc: Oral  Oral Oral  Resp: 18 16 20 22  Height:      Weight:      SpO2: 95% 95% 92% 98%   Body mass index is 27.22 kg/(m^2). GENERAL: The patient is a well-nourished female, in no acute distress. The vital signs are documented above. CARDIOVASCULAR: There is a regular rate and rhythm. I cannot palpate pedal pulses. She has bilateral lower extremity swelling. I cannot palpate radial pulses bilaterally. PULMONARY: There is good air exchange bilaterally without wheezing or rales. ABDOMEN: Soft and non-tender with normal pitched bowel sounds.  MUSCULOSKELETAL: There are no major deformities or cyanosis. NEUROLOGIC: No focal weakness or paresthesias are detected. SKIN: She has cellulitis of both lower extremities. She has some superficial abrasions of both lower extremities. She also has some superficial abrasions of her right upper  extremity. PSYCHIATRIC: The patient has a normal affect. She has 2 IVs in her left upper extremity.  DATA:  Lab Results  Component Value Date   WBC 13.0* 02/25/2014   HGB 10.4* 02/25/2014   HCT 29.5* 02/25/2014   MCV 77.0* 02/25/2014   PLT 226 02/25/2014   Lab Results  Component Value Date   NA 126* 02/25/2014   K 4.3 02/25/2014   CL 90* 02/25/2014   CO2 14* 02/25/2014   Lab Results  Component Value Date   CREATININE 4.33* 02/25/2014   Lab Results  Component Value Date   INR 1.10 02/17/2014   INR 1.0 06/15/2008   INR 0.9 01/22/2008   Lab Results  Component Value Date   HGBA1C 11.5* 02/17/2014   CBG (last 3)   Recent Labs  02/25/14 0803 02/25/14 0854 02/25/14 1148  GLUCAP 44* 92 97   VEIN MAP: Vein mapping is pending.  MEDICAL ISSUES:  STAGE V  END-STAGE RENAL DISEASE: The patient now has stage V end-stage renal disease. She needs to begin dialysis. I have scheduled her for placement of a tunneled dialysis catheter tomorrow. I've discussed the procedure and potential complications including, but not limited to, bleeding, arterial injury, or infection. We have also been asked to place long-term access. Her vein map is pending. I cannot palpate radial pulses and I have also ordered an upper extremity arterial doppler study to complete her workup for permanent access. However, I would not recommend proceeding with placement of new access in the upper extremities at this time. She has extensive wounds in the right upper arm. She is being treated for cellulitis of both lower extremities and is on vancomycin and Zosyn. I suspect she has diffuse atherosclerotic disease and will be at high risk for steal or graft thrombosis if her upper extremity Doppler study shows significant disease. Given her previous lower extremity bypasses with cellulitis of both legs she is not a good candidate for a thigh graft. I will follow up on her vein mapping and arterial Doppler study but we will not  plan on placement of permanent access until her cellulitis has resolved in her lower extremities, and she is in better medical condition.  DICKSON,CHRISTOPHER S [facility] [place]: [phone]   

## 2014-02-25 NOTE — Progress Notes (Signed)
Hypoglycemic Event  CBG: 44  Treatment: 15 GM carbohydrate snack, breakfast also   Symptoms: None  Follow-up CBG: [id number] CBG Result:92  Possible Reasons for Event: Other: renal issues, pt possibly holding onto oral diabetic medications per MD  Comments/MD notified:Dr. York SpanielBuriev notified and called back promptly. Dr. York SpanielBuriev putting in orders.     Sherene Siresillman, Revella Shelton Jo  Remember to initiate Hypoglycemia Order Set & complete

## 2014-02-25 NOTE — Progress Notes (Signed)
[facility]  Patient Status: Active (receiving services up to time of hospitalization)  [facility] is providing the following services: RN, PT and OT  If patient discharges after hours, please call [phone].   Amy Bregman 02/25/2014, 4:21 PM

## 2014-02-26 ENCOUNTER — Encounter (HOSPITAL_COMMUNITY)
Admission: EM | Disposition: A | Discharge status: Skilled Nursing Facility | Payer: Self-pay | Source: Home / Self Care | Attending: Internal Medicine

## 2014-02-26 ENCOUNTER — Encounter (HOSPITAL_COMMUNITY): Payer: Self-pay | Admitting: *Deleted

## 2014-02-26 ENCOUNTER — Encounter (HOSPITAL_COMMUNITY): Payer: Medicare Other | Admitting: Anesthesiology

## 2014-02-26 ENCOUNTER — Inpatient Hospital Stay (HOSPITAL_COMMUNITY): Payer: Medicare Other | Admitting: Anesthesiology

## 2014-02-26 ENCOUNTER — Inpatient Hospital Stay (HOSPITAL_COMMUNITY): Payer: Medicare Other

## 2014-02-26 DIAGNOSIS — E1122 Type 2 diabetes mellitus with diabetic chronic kidney disease: Secondary | ICD-10-CM

## 2014-02-26 HISTORY — PX: INSERTION OF DIALYSIS CATHETER: SHX1324

## 2014-02-26 LAB — GLUCOSE, CAPILLARY
Glucose-Capillary: 284 mg/dL — ABNORMAL HIGH (ref 70–99)
Glucose-Capillary: 292 mg/dL — ABNORMAL HIGH (ref 70–99)
Glucose-Capillary: 272 mg/dL — ABNORMAL HIGH (ref 70–99)
Glucose-Capillary: 128 mg/dL — ABNORMAL HIGH (ref 70–99)
Glucose-Capillary: 184 mg/dL — ABNORMAL HIGH (ref 70–99)
Glucose-Capillary: 119 mg/dL — ABNORMAL HIGH (ref 70–99)

## 2014-02-26 LAB — BASIC METABOLIC PANEL
ANION GAP: 23 — AB (ref 5–15)
BUN: 122 mg/dL — AB (ref 6–23)
CALCIUM: 6.6 mg/dL — AB (ref 8.4–10.5)
CHLORIDE: 86 meq/L — AB (ref 96–112)
CO2: 13 mEq/L — ABNORMAL LOW (ref 19–32)
CREATININE: 4.41 mg/dL — AB (ref 0.50–1.10)
GFR calc Af Amer: 10 mL/min — ABNORMAL LOW (ref >90)
GFR calc non Af Amer: 9 mL/min — ABNORMAL LOW (ref >90)
GLUCOSE: 286 mg/dL — AB (ref 70–99)
Potassium: 4.3 mEq/L (ref 3.7–5.3)
Sodium: 122 mEq/L — ABNORMAL LOW (ref 137–147)

## 2014-02-26 LAB — PARATHYROID HORMONE, INTACT (NO CA): PTH: 237 pg/mL — AB (ref 14–64)

## 2014-02-26 SURGERY — INSERTION OF DIALYSIS CATHETER
Anesthesia: Monitor Anesthesia Care | Site: Neck | Laterality: Right

## 2014-02-26 MED ORDER — LIDOCAINE HCL (PF) 1 % IJ SOLN: 5.0000 mL | INTRAMUSCULAR | Status: DC | PRN

## 2014-02-26 MED ORDER — 0.9 % SODIUM CHLORIDE (POUR BTL) OPTIME
TOPICAL | Status: DC | PRN
  Administered 2014-02-26: 1000 mL

## 2014-02-26 MED ORDER — HEPARIN SODIUM (PORCINE) 1000 UNIT/ML IJ SOLN
INTRAMUSCULAR | Status: DC | PRN
  Administered 2014-02-26: 4.6 mL

## 2014-02-26 MED ORDER — SODIUM CHLORIDE 0.9 % IV SOLN: 100.0000 mL | INTRAVENOUS | Status: DC | PRN

## 2014-02-26 MED ORDER — SODIUM CHLORIDE 0.9 % IR SOLN
Status: DC | PRN
  Administered 2014-02-26: 15:00:00

## 2014-02-26 MED ORDER — HEPARIN SODIUM (PORCINE) 1000 UNIT/ML IJ SOLN
INTRAMUSCULAR | Status: AC
  Filled 2014-02-26: qty 1

## 2014-02-26 MED ORDER — FENTANYL CITRATE 0.05 MG/ML IJ SOLN
INTRAMUSCULAR | Status: AC
  Filled 2014-02-26: qty 5

## 2014-02-26 MED ORDER — PENTAFLUOROPROP-TETRAFLUOROETH EX AERO: 1.0000 "application " | INHALATION_SPRAY | CUTANEOUS | Status: DC | PRN

## 2014-02-26 MED ORDER — OXYCODONE-ACETAMINOPHEN 5-325 MG PO TABS
1.0000 | ORAL_TABLET | ORAL | Status: DC | PRN
  Administered 2014-02-26: 1 via ORAL

## 2014-02-26 MED ORDER — LIDOCAINE-PRILOCAINE 2.5-2.5 % EX CREA: 1.0000 "application " | TOPICAL_CREAM | CUTANEOUS | Status: DC | PRN

## 2014-02-26 MED ORDER — LIDOCAINE-EPINEPHRINE (PF) 1 %-1:200000 IJ SOLN
INTRAMUSCULAR | Status: DC | PRN
  Administered 2014-02-26: 9 mL

## 2014-02-26 MED ORDER — HYDROMORPHONE HCL 1 MG/ML IJ SOLN: 0.2500 mg | INTRAMUSCULAR | Status: DC | PRN

## 2014-02-26 MED ORDER — NEPRO/CARBSTEADY PO LIQD
237.0000 mL | ORAL | Status: DC | PRN
Start: 2014-02-26 — End: 2014-02-27

## 2014-02-26 MED ORDER — ALTEPLASE 2 MG IJ SOLR
2.0000 mg | Freq: Once | INTRAMUSCULAR | Status: AC | PRN
Start: 2014-02-26 — End: 2014-02-26
  Filled 2014-02-26: qty 2

## 2014-02-26 MED ORDER — PRO-STAT SUGAR FREE PO LIQD
30.0000 mL | Freq: Three times a day (TID) | ORAL | Status: DC
  Administered 2014-02-26 – 2014-03-04 (×15): 30 mL via ORAL
  Filled 2014-02-26 (×22): qty 30

## 2014-02-26 MED ORDER — FENTANYL CITRATE 0.05 MG/ML IJ SOLN
INTRAMUSCULAR | Status: DC | PRN
  Administered 2014-02-26: 50 ug via INTRAVENOUS

## 2014-02-26 MED ORDER — HEPARIN SODIUM (PORCINE) 1000 UNIT/ML DIALYSIS: 1000.0000 [IU] | INTRAMUSCULAR | Status: DC | PRN

## 2014-02-26 MED ORDER — HEPARIN SODIUM (PORCINE) 1000 UNIT/ML DIALYSIS: 20.0000 [IU]/kg | INTRAMUSCULAR | Status: DC | PRN

## 2014-02-26 MED ORDER — LIDOCAINE-EPINEPHRINE (PF) 1 %-1:200000 IJ SOLN
INTRAMUSCULAR | Status: AC
  Filled 2014-02-26: qty 10

## 2014-02-26 MED ORDER — SODIUM CHLORIDE 0.9 % IV SOLN
INTRAVENOUS | Status: DC | PRN
  Administered 2014-02-26: 14:00:00 via INTRAVENOUS

## 2014-02-26 MED ORDER — SODIUM CHLORIDE 0.9 % IV SOLN
INTRAVENOUS | Status: DC
  Administered 2014-02-26: 14:00:00 via INTRAVENOUS

## 2014-02-26 MED ORDER — PROMETHAZINE HCL 25 MG/ML IJ SOLN: 6.2500 mg | INTRAMUSCULAR | Status: DC | PRN

## 2014-02-26 MED ORDER — OXYCODONE-ACETAMINOPHEN 5-325 MG PO TABS
ORAL_TABLET | ORAL | Status: AC
  Filled 2014-02-26: qty 2

## 2014-02-26 SURGICAL SUPPLY — 46 items
BAG BANDED W/RUBBER/TAPE 36X54 (MISCELLANEOUS) ×3 IMPLANT
BAG DECANTER FOR FLEXI CONT (MISCELLANEOUS) ×3 IMPLANT
CATH CANNON HEMO 15F 50CM (CATHETERS) IMPLANT
CATH CANNON HEMO 15FR 19 (HEMODIALYSIS SUPPLIES) IMPLANT
CATH CANNON HEMO 15FR 23CM (HEMODIALYSIS SUPPLIES) ×3 IMPLANT
CATH CANNON HEMO 15FR 31CM (HEMODIALYSIS SUPPLIES) IMPLANT
CATH CANNON HEMO 15FR 32CM (HEMODIALYSIS SUPPLIES) IMPLANT
COVER DOME SNAP 22 D (MISCELLANEOUS) ×3 IMPLANT
COVER PROBE W GEL 5X96 (DRAPES) ×3 IMPLANT
COVER SURGICAL LIGHT HANDLE (MISCELLANEOUS) ×3 IMPLANT
DECANTER SPIKE VIAL GLASS SM (MISCELLANEOUS) ×3 IMPLANT
DERMABOND ADHESIVE PROPEN (GAUZE/BANDAGES/DRESSINGS) ×2
DERMABOND ADVANCED .7 DNX6 (GAUZE/BANDAGES/DRESSINGS) ×1 IMPLANT
DRAPE C-ARM 42X72 X-RAY (DRAPES) ×3 IMPLANT
DRAPE CHEST BREAST 15X10 FENES (DRAPES) ×3 IMPLANT
GAUZE SPONGE 2X2 8PLY STRL LF (GAUZE/BANDAGES/DRESSINGS) ×1 IMPLANT
GAUZE SPONGE 4X4 16PLY XRAY LF (GAUZE/BANDAGES/DRESSINGS) ×3 IMPLANT
GLOVE BIO SURGEON STRL SZ 6.5 (GLOVE) ×2 IMPLANT
GLOVE BIO SURGEONS STRL SZ 6.5 (GLOVE) ×1
GLOVE BIOGEL PI IND STRL 6.5 (GLOVE) ×1 IMPLANT
GLOVE BIOGEL PI INDICATOR 6.5 (GLOVE) ×2
GLOVE SS BIOGEL STRL SZ 7 (GLOVE) ×1 IMPLANT
GLOVE SUPERSENSE BIOGEL SZ 7 (GLOVE) ×2
GLOVE SURG SS PI 6.5 STRL IVOR (GLOVE) ×3 IMPLANT
GOWN STRL REUS W/ TWL LRG LVL3 (GOWN DISPOSABLE) ×2 IMPLANT
GOWN STRL REUS W/TWL LRG LVL3 (GOWN DISPOSABLE) ×4
KIT BASIN OR (CUSTOM PROCEDURE TRAY) ×3 IMPLANT
KIT ROOM TURNOVER OR (KITS) ×3 IMPLANT
NEEDLE 18GX1X1/2 (RX/OR ONLY) (NEEDLE) ×3 IMPLANT
NEEDLE 22X1 1/2 (OR ONLY) (NEEDLE) ×3 IMPLANT
NEEDLE HYPO 25GX1X1/2 BEV (NEEDLE) ×3 IMPLANT
NS IRRIG 1000ML POUR BTL (IV SOLUTION) ×3 IMPLANT
PACK SURGICAL SETUP 50X90 (CUSTOM PROCEDURE TRAY) ×3 IMPLANT
PAD ARMBOARD 7.5X6 YLW CONV (MISCELLANEOUS) ×6 IMPLANT
SOAP 2 % CHG 4 OZ (WOUND CARE) ×3 IMPLANT
SPONGE GAUZE 2X2 STER 10/PKG (GAUZE/BANDAGES/DRESSINGS) ×2
SPONGE GAUZE 4X4 12PLY STER LF (GAUZE/BANDAGES/DRESSINGS) ×3 IMPLANT
SUT ETHILON 3 0 PS 1 (SUTURE) ×3 IMPLANT
SUT VICRYL 4-0 PS2 18IN ABS (SUTURE) ×3 IMPLANT
SYR 20CC LL (SYRINGE) ×3 IMPLANT
SYR 5ML LL (SYRINGE) ×6 IMPLANT
SYR CONTROL 10ML LL (SYRINGE) ×3 IMPLANT
SYRINGE 10CC LL (SYRINGE) ×3 IMPLANT
TOWEL OR 17X24 6PK STRL BLUE (TOWEL DISPOSABLE) ×3 IMPLANT
TOWEL OR 17X26 10 PK STRL BLUE (TOWEL DISPOSABLE) ×3 IMPLANT
WATER STERILE IRR 1000ML POUR (IV SOLUTION) IMPLANT

## 2014-02-26 NOTE — Progress Notes (Signed)
Patient ID: [name], female   DOB: [birth year], 78 y.o.   MRN: [id number]  [facility] Progress Note    Subjective:   "just wish I had my voice back"   Objective:   BP 111/71  Pulse 87  Temp(Src) 98.3 F (36.8 C) (Oral)  Resp 16  Ht 5' 4.96" (1.65 m)  Wt 74.1 kg (163 lb 5.8 oz)  BMI 27.22 kg/m2  SpO2 99%  Intake/Output: I/O last 3 completed shifts: In: 1798.3 [P.O.:360; I.V.:1088.3; IV Piggyback:350] Out: -    Intake/Output this shift:  Total I/O In: 120 [P.O.:120] Out: -  Weight change:   Physical Exam: FAO:ZHYQMGen:frail, chronically ill-appearing WF CVS:no rub Resp:occ rhonchi VHQ:IONGEXAbd:benign Ext:no edema  Labs: BMET  Recent Labs Lab 02/20/14 0558 02/24/14 1105 02/24/14 1123 02/25/14 0443 02/26/14 0600  NA 125* 126* 122* 126* 122*  K 3.9 4.3 4.1 4.3 4.3  CL 88* 87* 93* 90* 86*  CO2 16* 15*  --  14* 13*  GLUCOSE 120* 94 95 56* 286*  BUN 108* 126* 128* 123* 122*  CREATININE 3.83* 4.35* 4.80* 4.33* 4.41*  ALBUMIN  --  2.0*  --   --   --   CALCIUM 7.0* 7.4*  --  7.1* 6.6*   CBC  Recent Labs Lab 02/24/14 1105 02/24/14 1123 02/25/14 0443  WBC 10.3  --  13.0*  NEUTROABS 9.6*  --   --   HGB 11.6* 12.9 10.4*  HCT 32.8* 38.0 29.5*  MCV 77.2*  --  77.0*  PLT 242  --  226    @IMGRELPRIORS @ Medications:    . aspirin EC  81 mg Oral Daily  . collagenase   Topical Daily  . feeding supplement (PRO-STAT SUGAR FREE 64)  30 mL Oral TID WC  . heparin subcutaneous  5,000 Units Subcutaneous 3 times per day  . insulin aspart  0-9 Units Subcutaneous TID WC  . piperacillin-tazobactam (ZOSYN)  IV  2.25 g Intravenous 4 times per day  . vancomycin  1,000 mg Intravenous Q48H     Assessment/ Plan:   1. Cellulitis- on vanc/zosyn 2. ESRD- to have TDC and initiate HD then AVF/AVG placed by VVS as the schedule allows 1. Plan for HD today after HD catheter 3. Hyponatremia- will correct with HD 4. Anemia:will initiate epo and follow iron  stores 5. CKD-MBD: low calcium- will likely require vit D +/- phosphate binders, will check phos and iPTH 6. Nutrition:protein malnutrition.  Encourage protein and follow after HD 7. Hypertension:stable 8. Vascular access- as above  Yoskar Murrillo A 02/26/2014, 12:40 PM

## 2014-02-26 NOTE — Transfer of Care (Signed)
Immediate Anesthesia Transfer of Care Note  Patient: [name]  Procedure(s) Performed: Procedure(s): INSERTION OF DIALYSIS CATHETER RIGHT IJ (Right)  Patient Location: PACU  Anesthesia Type:MAC  Level of Consciousness: awake  Airway & Oxygen Therapy: Patient Spontanous Breathing and Patient connected to nasal cannula oxygen  Post-op Assessment: Report given to PACU RN, Post -op Vital signs reviewed and stable and Patient moving all extremities  Post vital signs: Reviewed and stable  Complications: No apparent anesthesia complications

## 2014-02-26 NOTE — Anesthesia Preprocedure Evaluation (Signed)
Anesthesia Evaluation  Patient identified by MRN, date of birth, ID band Patient awake    Reviewed: Allergy & Precautions, H&P , NPO status , Patient's Chart, lab work & pertinent test results  History of Anesthesia Complications Negative for: history of anesthetic complications  Airway Mallampati: I TM Distance: >3 FB Neck ROM: Full    Dental  (+) Edentulous Upper, Edentulous Lower   Pulmonary former smoker,    Pulmonary exam normal       Cardiovascular hypertension, + CAD and + Peripheral Vascular Disease     Neuro/Psych negative neurological ROS  negative psych ROS   GI/Hepatic negative GI ROS, Neg liver ROS,   Endo/Other  diabetes  Renal/GU Renal InsufficiencyRenal disease     Musculoskeletal   Abdominal   Peds  Hematology   Anesthesia Other Findings   Reproductive/Obstetrics                           Anesthesia Physical Anesthesia Plan  ASA: III  Anesthesia Plan: MAC   Post-op Pain Management:    Induction: Intravenous  Airway Management Planned: Simple Face Mask  Additional Equipment:   Intra-op Plan:   Post-operative Plan:   Informed Consent: I have reviewed the patients History and Physical, chart, labs and discussed the procedure including the risks, benefits and alternatives for the proposed anesthesia with the patient or authorized representative who has indicated his/her understanding and acceptance.   Dental advisory given  Plan Discussed with: CRNA, Anesthesiologist and Surgeon  Anesthesia Plan Comments:         Anesthesia Quick Evaluation

## 2014-02-26 NOTE — H&P (View-Only) (Signed)
[facility]  Patient name: [name] MRN: [id number] DOB: 1936/05/12 Sex: female  REASON FOR CONSULT: Needs catheter and permanent access. Consult from the nephrologist.  HPI: Sierra PanderLorine E [name] is a 78 y.o. female who was admitted in on 02/24/2014 with worsening lower extremity edema. She had recently been admitted to the hospital with cellulitis of her lower extremities and worsening chronic kidney disease. She was discharged from the hospital on 02/20/2014 but readmitted with worsening edema. On admission, she was found to be in acute renal failure. She was seen in consultation by nephrology and is felt to have advanced stage V chronic kidney disease. She needs to begin dialysis. This reason vascular surgery was consult.  She has multiple abrasions in her lower extremities and also her right upper arm. She states that these are related to a fall. She's also had cellulitis of both lower extremities.  She has previously undergone a right to left femoral-femoral bypass and left femoral popliteal bypass in 2009.  She does admit to dyspnea on exertion and also some orthopnea.  Past Medical History  Diagnosis Date  . Paroxysmal atrial fibrillation   . PVD (peripheral vascular disease)     status post fem-fem bypass in sept/2009  . Junctional bradycardia     in the setting of hyperkalemia in the past  . GI bleeding   . Coronary artery disease   . Mitral regurgitation     status post annuloplasty ring.  . Pulmonary hypertension     moderate  . Hypertension   . Diabetes mellitus   . Renal insufficiency     in feb. 2008, acute on chronic   Family History  Problem Relation Age of Onset  . Cancer Mother     deceased  . Heart attack Father     deceased   SOCIAL HISTORY: History  Substance Use Topics  . Smoking status: Former Smoker    Types: Cigarettes    Quit date: 05/14/1998  . Smokeless tobacco: Not on file  . Alcohol Use: No    Allergies    Allergen Reactions  . Sulfonamide Derivatives Itching  . Codeine Rash   REVIEW OF SYSTEMS: Arly.Keller[X ] denotes positive finding; [  ] denotes negative finding CARDIOVASCULAR:  [ ]  chest pain   [ ]  chest pressure   [ ]  palpitations   Arly.Keller[X ] orthopnea   Arly.Keller[X ] dyspnea on exertion   [ ]  claudication   [ ]  rest pain   [ ]  DVT   [ ]  phlebitis PULMONARY:   [ ]  productive cough   [ ]  asthma   [ ]  wheezing NEUROLOGIC:   [ ]  weakness  [ ]  paresthesias  [ ]  aphasia  [ ]  amaurosis  [ ]  dizziness HEMATOLOGIC:   [ ]  bleeding problems   [ ]  clotting disorders MUSCULOSKELETAL:  Arly.Keller[X ] joint pain   [ ]  joint swelling [ ]  leg swelling GASTROINTESTINAL: [ ]   blood in stool  [ ]   hematemesis GENITOURINARY:  [ ]   dysuria  [ ]   hematuria PSYCHIATRIC:  [ ]  history of major depression INTEGUMENTARY:  [ ]  rashes  Arly.Keller[X ] ulcers CONSTITUTIONAL:  [ ]  fever   [ ]  chills  PHYSICAL EXAM: Filed Vitals:   02/24/14 2331 02/25/14 0458 02/25/14 1016 02/25/14 1311  BP: 107/42 103/46 116/60 128/60  Pulse: 70 68 74 71  Temp: 97.5 F (36.4 C) 97.5 F (36.4 C) 97.7 F (36.5 C) 97.4 F (36.3  C)  TempSrc: Oral  Oral Oral  Resp: 18 16 20 22   Height:      Weight:      SpO2: 95% 95% 92% 98%   Body mass index is 27.22 kg/(m^2). GENERAL: The patient is a well-nourished female, in no acute distress. The vital signs are documented above. CARDIOVASCULAR: There is a regular rate and rhythm. I cannot palpate pedal pulses. She has bilateral lower extremity swelling. I cannot palpate radial pulses bilaterally. PULMONARY: There is good air exchange bilaterally without wheezing or rales. ABDOMEN: Soft and non-tender with normal pitched bowel sounds.  MUSCULOSKELETAL: There are no major deformities or cyanosis. NEUROLOGIC: No focal weakness or paresthesias are detected. SKIN: She has cellulitis of both lower extremities. She has some superficial abrasions of both lower extremities. She also has some superficial abrasions of her right upper  extremity. PSYCHIATRIC: The patient has a normal affect. She has 2 IVs in her left upper extremity.  DATA:  Lab Results  Component Value Date   WBC 13.0* 02/25/2014   HGB 10.4* 02/25/2014   HCT 29.5* 02/25/2014   MCV 77.0* 02/25/2014   PLT 226 02/25/2014   Lab Results  Component Value Date   NA 126* 02/25/2014   K 4.3 02/25/2014   CL 90* 02/25/2014   CO2 14* 02/25/2014   Lab Results  Component Value Date   CREATININE 4.33* 02/25/2014   Lab Results  Component Value Date   INR 1.10 02/17/2014   INR 1.0 06/15/2008   INR 0.9 01/22/2008   Lab Results  Component Value Date   HGBA1C 11.5* 02/17/2014   CBG (last 3)   Recent Labs  02/25/14 0803 02/25/14 0854 02/25/14 1148  GLUCAP 44* 92 97   VEIN MAP: Vein mapping is pending.  MEDICAL ISSUES:  STAGE V  END-STAGE RENAL DISEASE: The patient now has stage V end-stage renal disease. She needs to begin dialysis. I have scheduled her for placement of a tunneled dialysis catheter tomorrow. I've discussed the procedure and potential complications including, but not limited to, bleeding, arterial injury, or infection. We have also been asked to place long-term access. Her vein map is pending. I cannot palpate radial pulses and I have also ordered an upper extremity arterial doppler study to complete her workup for permanent access. However, I would not recommend proceeding with placement of new access in the upper extremities at this time. She has extensive wounds in the right upper arm. She is being treated for cellulitis of both lower extremities and is on vancomycin and Zosyn. I suspect she has diffuse atherosclerotic disease and will be at high risk for steal or graft thrombosis if her upper extremity Doppler study shows significant disease. Given her previous lower extremity bypasses with cellulitis of both legs she is not a good candidate for a thigh graft. I will follow up on her vein mapping and arterial Doppler study but we will not  plan on placement of permanent access until her cellulitis has resolved in her lower extremities, and she is in better medical condition.  Kaizlee Carlino S [facility]: [phone]

## 2014-02-26 NOTE — Progress Notes (Signed)
INITIAL NUTRITION ASSESSMENT  DOCUMENTATION CODES Per approved criteria  -Not Applicable   INTERVENTION: 30 ml Prostat TID  NUTRITION DIAGNOSIS: Inadequate oral intake related to altered GI function as evidenced by chronic diarrhea, PO: 50%.   Goal: Pt will meet >90% of estimated nutritional needs  Monitor:  PO/su[pplement intake, labs, weight changes, I/O's  Reason for Assessment: MST=3  78 y.o. female  Admitting Dx: <principal problem not specified>  78 y.o. female with PMH of CAD s/p CABG, ? S/p MVR, DM, PAF, PAD, CKD 4-5, Gout, Recent cellulitis, recently discharged from the hospital on 10/10 after being treated for cellulitis and ARF, comes in for worsening pedal edema, and persistent cellulitis changes, and diarrhea   ASSESSMENT: Pt with hx of stage IV kindey disease. She is scheduled for HD cath placement today. She denies weight loss and confirms UBW around 160#.  Pt reports poor appetite over the past few weeks due to chronic diarrhea. She reports that she was [facility] for similar complaints. PO: 50%. Pt reports she ate "a little" at breakfast and this is the best she ate in a long time. She denies difficulty chewing or swallowing despite having several missing teeth.  Pt unable to give a reliable diet recall. She reports she eats "all kinds of things at home", with a diet high in fruits and vegetables. She has assistance with meal preparation with multiple family members. She is also [facility].  Nutrition-focused physical exam performed. No signs of fat and muscle depletion noted. Exam revealed pt with several chronic wounds on legs.  Pt would likely benefit from diet education with caregivers present due to poor glycemic control and new onset HD. Discussed importance of good PO intake to support healing process and assist with wound healing. Labs reviewed.Na: 122, Cl: 86, CO2: 13, BUN/Creat: 122.4.41, Calcium: 6.6, Glucose: 286, CBGs:  284-292. Hgb A1c reveals poor control.   Height: Ht Readings from Last 1 Encounters:  02/24/14 5' 4.96" (1.65 m)    Weight: Wt Readings from Last 1 Encounters:  02/24/14 163 lb 5.8 oz (74.1 kg)    Ideal Body Weight: 125#  % Ideal Body Weight: 130%  Wt Readings from Last 10 Encounters:  02/24/14 163 lb 5.8 oz (74.1 kg)  02/24/14 163 lb 5.8 oz (74.1 kg)  02/19/14 163 lb 5.8 oz (74.1 kg)  04/29/13 150 lb (68.04 kg)  06/06/11 172 lb (78.019 kg)  04/19/10 161 lb (73.029 kg)  09/07/09 165 lb (74.844 kg)  03/16/09 160 lb (72.576 kg)    Usual Body Weight: 160#  % Usual Body Weight: 102%  BMI:  Body mass index is 27.22 kg/(m^2). Overweight  Estimated Nutritional Needs: Kcal: 2000-2200  Protein: 89-99 grams Fluid: 1-1.5 L  Skin: rt arm skin tear, weeping edema on rt and lt legs  Diet Order: Renal with 1200 ml fluid restriction  EDUCATION NEEDS: -Education needs addressed   Intake/Output Summary (Last 24 hours) at 02/26/14 1008 Last data filed at 02/26/14 1001  Gross per 24 hour  Intake 1698.34 ml  Output      0 ml  Net 1698.34 ml    Last BM: 02/26/14  Labs:   Recent Labs Lab 02/24/14 1105 02/24/14 1123 02/25/14 0443 02/26/14 0600  NA 126* 122* 126* 122*  K 4.3 4.1 4.3 4.3  CL 87* 93* 90* 86*  CO2 15*  --  14* 13*  BUN 126* 128* 123* 122*  CREATININE 4.35* 4.80* 4.33* 4.41*  CALCIUM 7.4*  --  7.1* 6.6*  GLUCOSE 94 95 56* 286*    CBG (last 3)   Recent Labs  02/25/14 2311 02/26/14 0611 02/26/14 0732  GLUCAP 290* 284* 292*   Lab Results  Component Value Date   HGBA1C 11.5* 02/17/2014   Scheduled Meds: . aspirin EC  81 mg Oral Daily  . collagenase   Topical Daily  . heparin subcutaneous  5,000 Units Subcutaneous 3 times per day  . insulin aspart  0-9 Units Subcutaneous TID WC  . piperacillin-tazobactam (ZOSYN)  IV  2.25 g Intravenous 4 times per day  . vancomycin  1,000 mg Intravenous Q48H    Continuous Infusions:   Past Medical  History  Diagnosis Date  . Paroxysmal atrial fibrillation   . PVD (peripheral vascular disease)     status post fem-fem bypass in sept/2009  . Junctional bradycardia     in the setting of hyperkalemia in the past  . GI bleeding   . Coronary artery disease   . Mitral regurgitation     status post annuloplasty ring.  . Pulmonary hypertension     moderate  . Hypertension   . Diabetes mellitus   . Renal insufficiency     in feb. 2008, acute on chronic    Past Surgical History  Procedure Laterality Date  . Coronary artery bypass graft      LIMA to the LAD, SVG to PDA, SVG to posterolateral, SVG to diagonal 2000  . Cholecystectomy    . Amputation      partial of right thumb    Gergory Biello A. Mayford KnifeWilliams, RD, LDN Pager: [phone] After hours Pager: [phone]

## 2014-02-26 NOTE — Evaluation (Signed)
Physical Therapy Evaluation Patient Details Name: [name] MRN: [id number] DOB: 19-Jun-1935 Today's Date: 02/26/2014   History of Present Illness  78 y.o. female [facility]14 with worsening pedal edema, and worsening cellulitic changes in bil leg.  Pt dx with SIRS due to chronic cellulits, dermatitis and skin ulcers.  Also dx with CKD stage 5 (s/p AVG graft placement in R IJ on 02/26/14), hyponatremia, and metabolic acidosis.  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     Clinical Impression  Pt is significantly deconditioned and is requiring two people to get her from the bed to the recliner chair (stand pivot) safely.  Her knees buckle and she requires max two person assist to hit her sitting target which was only two steps around.  Pt is reluctant, but family agrees that she cannot go home at her current mobility level.  PT recommending CIR consult to see if she would be a candidate for inpatient rehab after her acute hospital stay.   PT to follow acutely for deficits listed below.       Follow Up Recommendations CIR    Equipment Recommendations  Wheelchair (measurements PT);Wheelchair cushion (measurements PT)    Recommendations for Other Services Rehab consult     Precautions / Restrictions Precautions Precautions: Fall Precaution Comments: h/o falls Other Brace/Splint: per pt she has been wearing a "boot" on her right ankle due to a "chipped ankle bone", but did not bring it with her to the hospita;      Mobility  Bed Mobility Overal bed mobility: Needs Assistance Bed Mobility: Supine to Sit     Supine to sit: Mod assist     General bed mobility comments: Mod assist to support trunk to get to sitting EOB.  Pt able to progress both legs over the side, but needs assist even with bed rail to get trunk to upright sitting.    Transfers Overall transfer level: Needs assistance Equipment used: Rolling walker (2 wheeled) Transfers: Sit to/from [employer]Stand;Stand Pivot Transfers Sit to Stand: +2 physical assistance;Mod assist Stand pivot transfers: +2 physical assistance;Mod assist;Max assist       General transfer comment: Two person mod assist to get pt to standing and pt immdiately hyperextended her knees and leaned her lower legs against the bed for stability.  As we pivoted with RW pt was mod initially and then became max assist to safely get to chair as her legs buckeled and she crashed down into the recliner.   Ambulation/Gait             General Gait Details: unable at this time she is too weak.          Balance Overall balance assessment: Needs assistance Sitting-balance support: Feet supported;Bilateral upper extremity supported Sitting balance-Leahy Scale: Fair     Standing balance support: Bilateral upper extremity supported Standing balance-Leahy Scale: Poor Standing balance comment: Poor to zero standing balance.  Pt is unable to stand without external support from RW AND external assist from two people.                              Pertinent Vitals/Pain Pain Assessment: Faces Faces Pain Scale: Hurts a little bit Pain Location: bil feet Pain Descriptors / Indicators: Aching;Burning Pain Intervention(s): Limited activity within patient's tolerance;Monitored during session;Repositioned  Home Living Family/patient expects to be discharged to:: Private residence Living Arrangements: Alone Available Help at Discharge: Family;Available PRN/intermittently Type of Home: Mobile home Home Access: Stairs to enter Entrance Stairs-Rails: Right;Left;Can reach both Entrance Stairs-Number of Steps: 4 Home Layout: One level Home Equipment: Walker - 2 wheels;Bedside commode;Walker - 4 wheels Additional Comments: Per pt report she only sponge bathes now because she is scared to get  into the shower for fear of falling.  Per brother and family they are having a ramp built now.     Prior Function Level of Independence: Independent with assistive device(s)         Comments: Per pt she uses her RW.  She was active, but has not been home long enough to recieve HHPT/OT/RN and an aide.      Hand Dominance   Dominant Hand: Right    Extremity/Trunk Assessment   Upper Extremity Assessment: Generalized weakness           Lower Extremity Assessment: Generalized weakness      Cervical / Trunk Assessment: Kyphotic  Communication   Communication: No difficulties  Cognition Arousal/Alertness: Awake/alert Behavior During Therapy: WFL for tasks assessed/performed Overall Cognitive Status: Within Functional Limits for tasks assessed (not specifically tested, but history was accurate per family)                               Assessment/Plan    PT Assessment Patient needs continued PT services  PT Diagnosis Difficulty walking;Abnormality of gait;Generalized weakness;Acute pain   PT Problem List Decreased strength;Decreased range of motion;Decreased activity tolerance;Decreased balance;Decreased mobility;Decreased knowledge of use of DME;Decreased knowledge of precautions;Impaired sensation;Decreased skin integrity;Pain  PT Treatment Interventions DME instruction;Gait training;Functional mobility training;Therapeutic activities;Therapeutic exercise;Balance training;Neuromuscular re-education;Patient/family education;Modalities   PT Goals (Current goals can be found in the Care Plan section) Acute Rehab PT Goals Patient Stated Goal: Pt wants to go home PT Goal Formulation: With patient/family Time For Goal Achievement: 03/12/14 Potential to Achieve Goals: Good    Frequency Min 3X/week   Barriers to discharge Decreased caregiver support family did not indicate that they could provide 24/7 assist, and certainly not two person 24/7 assist that would be  required for her to go home in her current state.        End of Session   Activity Tolerance: Patient limited by fatigue;Patient limited by pain Patient left: in chair;with call bell/phone within reach;with family/visitor present Nurse Communication: Mobility status;Other (comment) (to RN tech needs two person assist for transfers)         Time: [phone] PT Time Calculation (min): 19 min   Charges:   PT Evaluation $Initial PT Evaluation Tier I: 1 Procedure PT Treatments $Therapeutic Activity: 8-22 mins        Anyjah Roundtree B. Sumie Remsen, PT, DPT [phone]   02/26/2014, 6:38 PM

## 2014-02-26 NOTE — Anesthesia Postprocedure Evaluation (Signed)
Anesthesia Post Note  Patient: [name]  Procedure(s) Performed: Procedure(s) (LRB): INSERTION OF DIALYSIS CATHETER RIGHT IJ (Right)  Anesthesia type: MAC  Patient location: PACU  Post pain: Pain level controlled  Post assessment: Patient's Cardiovascular Status Stable  Last Vitals:  Filed Vitals:   02/26/14 1700  BP: 134/56  Pulse: 53  Temp: 36.4 C  Resp: 19    Post vital signs: Reviewed and stable  Level of consciousness: sedated  Complications: No apparent anesthesia complications

## 2014-02-26 NOTE — Interval H&P Note (Signed)
History and Physical Interval Note:  02/26/2014 1:29 PM  [name] [place]  has presented today for surgery, with the diagnosis of Stage IV, chronic kidney disease N18.4  The various methods of treatment have been discussed with the patient and family. After consideration of risks, benefits and other options for treatment, the patient has consented to  Procedure(s): INSERTION OF DIALYSIS CATHETER (N/A) as a surgical intervention .  The patient's history has been reviewed, patient examined, no change in status, stable for surgery.  I have reviewed the patient's chart and labs.  Questions were answered to the patient's satisfaction.     Lundy Cozart S

## 2014-02-26 NOTE — Progress Notes (Signed)
[facility] PROGRESS NOTE  [name] CBU:[id number] DOB: Nov 15, 1935 DOA: 02/24/2014 PCP: Sherrie Mustache, MD  Assessment/Plan: 78 y.o. female with PMH of CAD s/p CABG, ? S/p MVR, DM, PAF, PAD, CKD 4-5, Gout, Recent cellulitis, recently discharged from the hospital on 10/10 after being treated for cellulitis and ARF, comes in for worsening pedal edema, and persistent cellulitis changes, and diarrhea   1. Chronic cellulitis, dermatitis, skin ulcers due to chronic leg edema, PAD; sirs/sepsis on admisison -on atx; blood cultures NGTD; afebrile; down titrate atx as possible   2. CKD 5, Hyponatremia, AG met acidosis likely due CKD; US renal: Atrophic kidneys  -awaiting HD cath; diuretics on hold; appreciate vascular surgery, nephrology input;  3. CAD s/p CABG, ? S/p MVR; patient denies any chest pain; cont monitor; added ASA 4. PAF, patient declined anticoagulation; HR stable; not on BB; start ASA 5. Probable CHF; pend echo; clinically stable; HD for volume control  6. DM, hypoglycemia likely due to PO glipizide; d/c glipizide; Pt was on temporary on d10 drip; monitor  7. Diarrhea, resolving;  c diff negative;    Code Status: full Family Communication: d/w patient, called family Sierra, [name] Other [phone] no answer; tried [phone] provided by patient no answer; try later   (indicate person spoken with, relationship, and if by phone, the number) Disposition Plan: pend clinical improvement    Consultants:  Nephrology   Procedures:  none  Antibiotics:  Zosyn 10/14<<  vanc 10/14<<  Metronidazole 10/15<<<   (indicate start date, and stop date if known)  HPI/Subjective: alert  Objective: Filed Vitals:   02/26/14 0605  BP: 111/71  Pulse: 87  Temp: 98.3 F (36.8 C)  Resp: 16    Intake/Output Summary (Last 24 hours) at 02/26/14 0952 Last data filed at 02/26/14 8891  Gross per 24 hour  Intake 1578.34 ml  Output      0 ml  Net 1578.34 ml   Filed  Weights   02/24/14 1130  Weight: 74.1 kg (163 lb 5.8 oz)    Exam:   General:  alert  Cardiovascular: s1,s2 +JVD  Respiratory: few crackles   Abdomen: soft, nt,dn   Musculoskeletal: no chronic dermatitis, edema, ulcers    Data Reviewed: Basic Metabolic Panel:  Recent Labs Lab 02/20/14 0558 02/24/14 1105 02/24/14 1123 02/25/14 0443 02/26/14 0600  NA 125* 126* 122* 126* 122*  K 3.9 4.3 4.1 4.3 4.3  CL 88* 87* 93* 90* 86*  CO2 16* 15*  --  14* 13*  GLUCOSE 120* 94 95 56* 286*  BUN 108* 126* 128* 123* 122*  CREATININE 3.83* 4.35* 4.80* 4.33* 4.41*  CALCIUM 7.0* 7.4*  --  7.1* 6.6*   Liver Function Tests:  Recent Labs Lab 02/24/14 1105  AST 48*  ALT 45*  ALKPHOS 190*  BILITOT 0.7  PROT 5.3*  ALBUMIN 2.0*   No results found for this basename: LIPASE, AMYLASE,  in the last 168 hours No results found for this basename: AMMONIA,  in the last 168 hours CBC:  Recent Labs Lab 02/24/14 1105 02/24/14 1123 02/25/14 0443  WBC 10.3  --  13.0*  NEUTROABS 9.6*  --   --   HGB 11.6* 12.9 10.4*  HCT 32.8* 38.0 29.5*  MCV 77.2*  --  77.0*  PLT 242  --  226   Cardiac Enzymes:  Recent Labs Lab 02/24/14 1105  TROPONINI <0.30   BNP (last 3 results) No results found for this basename: PROBNP,  in the last 8760 hours CBG:  Recent Labs Lab 02/25/14 1148 02/25/14 1708 02/25/14 2311 02/26/14 0611 02/26/14 0732  GLUCAP 97 114* 290* 284* 292*    Recent Results (from the past 240 hour(s))  CLOSTRIDIUM DIFFICILE BY PCR     Status: None   Collection Time    02/17/14  1:04 AM      Result Value Ref Range Status   C difficile by pcr NEGATIVE  NEGATIVE Final  URINE CULTURE     Status: None   Collection Time    02/24/14 10:51 AM      Result Value Ref Range Status   Specimen Description URINE, CATHETERIZED   Final   Special Requests NONE   Final   Culture  Setup Time     Final   Value: 02/24/2014 12:18     Performed at [facility]      Final   Value: NO GROWTH     Performed at [facility]   Culture     Final   Value: NO GROWTH     Performed at [facility]   Report Status 02/25/2014 FINAL   Final  CULTURE, BLOOD (ROUTINE X 2)     Status: None   Collection Time    02/24/14 11:05 AM      Result Value Ref Range Status   Specimen Description BLOOD RIGHT ARM   Final   Special Requests BOTTLES DRAWN AEROBIC AND ANAEROBIC 10ML   Final   Culture  Setup Time     Final   Value: 02/24/2014 17:27     Performed at [facility]   Culture     Final   Value:        BLOOD CULTURE RECEIVED NO GROWTH TO DATE CULTURE WILL BE HELD FOR 5 DAYS BEFORE ISSUING A FINAL NEGATIVE REPORT     Performed at [facility]   Report Status PENDING   Incomplete  CULTURE, BLOOD (ROUTINE X 2)     Status: None   Collection Time    02/24/14 11:45 AM      Result Value Ref Range Status   Specimen Description BLOOD LEFT ARM   Final   Special Requests BOTTLES DRAWN AEROBIC AND ANAEROBIC 10ML   Final   Culture  Setup Time     Final   Value: 02/24/2014 17:29     Performed at [facility]   Culture     Final   Value:        BLOOD CULTURE RECEIVED NO GROWTH TO DATE CULTURE WILL BE HELD FOR 5 DAYS BEFORE ISSUING A FINAL NEGATIVE REPORT     Performed at [facility]   Report Status PENDING   Incomplete  CLOSTRIDIUM DIFFICILE BY PCR     Status: None   Collection Time    02/25/14  4:56 AM      Result Value Ref Range Status   C difficile by pcr NEGATIVE  NEGATIVE Final     Studies: US Renal  02/24/2014   CLINICAL DATA:  Increased creatinine, diabetes, hypertension, chronic renal failure  EXAM: RENAL/URINARY TRACT ULTRASOUND COMPLETE  COMPARISON:  None.  FINDINGS: Right Kidney:  Length: 8.9 cm. Increased renal cortical echogenicity. Multiple shadowing echogenic foci consistent with nephrolithiasis with the largest measuring 5 mm. No mass or hydronephrosis visualized.  Left Kidney:  Length: 8.9 cm. Increased  renal cortical echogenicity. There is a 2.3 x 2.1 x 2.5 cm anechoic left renal mass most consistent with a  cyst. There are a few echogenic foci in the left kidney likely reflecting nephrolithiasis. No hydronephrosis visualized.  Bladder:  Appears normal for degree of bladder distention.  IMPRESSION: 1. Bilateral atrophic kidneys and increased renal cortical echogenicity as can be seen with medical renal disease. 2. Bilateral nephrolithiasis.   Electronically Signed   By: Kathreen Devoid   On: 02/24/2014 21:38   Dg Chest Portable 1 View  02/24/2014   CLINICAL DATA:  Fatigue, nausea, and vomiting.  EXAM: PORTABLE CHEST - 1 VIEW  COMPARISON:  Two-view chest 06/15/2008  FINDINGS: The heart is enlarged. The size is exaggerated by low lung volumes. Atherosclerotic changes are again noted at the aortic arch. Diffuse interstitial coarsening is chronic. This is exaggerated by the low lung volumes. No focal airspace disease is evident. The visualized soft tissues and bony thorax are unremarkable.  IMPRESSION: 1. Low lung volumes. 2. Stable cardiomegaly without failure.   Electronically Signed   By: Lawrence Santiago M.D.   On: 02/24/2014 11:17    Scheduled Meds: . aspirin EC  81 mg Oral Daily  . collagenase   Topical Daily  . heparin subcutaneous  5,000 Units Subcutaneous 3 times per day  . insulin aspart  0-9 Units Subcutaneous TID WC  . piperacillin-tazobactam (ZOSYN)  IV  2.25 g Intravenous 4 times per day  . vancomycin  1,000 mg Intravenous Q48H   Continuous Infusions:    Active Problems:   MITRAL REGURGITATION   Essential hypertension   Acute on chronic renal failure   DM type 2 causing renal disease   CKD (chronic kidney disease) stage 4, GFR 15-29 ml/min   SIRS (systemic inflammatory response syndrome)   Hypothermia    Time spent: >35 minutes     Kinnie Feil  [facility] Pager [phone]. If 7PM-7AM, please contact night-coverage at [email or web address], password [facility] 02/26/2014, 9:52  AM  LOS: 2 days

## 2014-02-26 NOTE — Op Note (Signed)
02/26/2014  PREOP DIAGNOSIS: Chronic kidney disease  POSTOP DIAGNOSIS: Chronic kidney disease  PROCEDURE: Ultrasound guided placement of right IJ Diatek catheter (23 cm)  SURGEON: Di Kindlehristopher S. Edilia Boickson, MD, FACS  ASSIST: none  ANESTHESIA: local with sedation   EBL: minimal  FINDINGS: patent right IJ  INDICATIONS: hemodialysis  TECHNIQUE: The patient was taken to the operating room and sedated by anesthesia. The neck and upper chest were prepped and draped in the usual sterile fashion. After the skin was anesthetized with 1% lidocaine, and under ultrasound guidance, the right IJ was cannulated and a guidewire introduced into the superior vena cava under fluoroscopic control. The tract over the wire was dilated and then the dilator and peel-away sheath were passed over the wire and the wire and dilator removed. The catheter was passed through the peel-away sheath and positioned in the right atrium. The exit site for the catheter was selected and the skin anesthetized between the 2 areas. The catheter was then brought through the tunnel, cut to the appropriate length, and the distal ports were attached. Both ports withdrew easily, were then flushed with heparinized saline and filled with concentrated heparin. The catheter was secured at its exit site with a 3-0 nylon suture. The IJ cannulation site was closed with a 4-0 subcuticular stitch. A sterile dressing was applied. The patient tolerated the procedure well and was transferred to the recovery room in stable condition. All needle and sponge counts were correct.  Waverly Ferrarihristopher Vishwa Dais, MD, FACS [facility]  DATE OF OPERATION: 02/26/2014 DATE OF DICTATION: 02/26/2014

## 2014-02-26 NOTE — Clinical Documentation Improvement (Signed)
Presents with SIRS, persistent cellulitic changes of leg, ARF and ESRD.   WBC = 13  Rectal temps 92.6, 93.7, 94.3 on day of admit  IV Zosyn Q6H and Vancomycin Q48H initiated  SIRS criteria met with no mention of Sepsis in chart; source of infection present with organ failure  There is no assumed link between SIRS and Sepsis  Please clarify if you feel: Sepsis ruled in            Sepsis ruled out  Please document findings in next progress note and discharge summary.  Thank You, Zoila Shutter ,RN Clinical Documentation Specialist:  [place] [facility]

## 2014-02-26 NOTE — Care Management Note (Signed)
  Page 1 of 1   03/02/2014     11:48:32 AM CARE MANAGEMENT NOTE 03/02/2014  Patient:  [name],[name]   Account Number:  [id number]  Date Initiated:  02/26/2014  Documentation initiated by:  Ronny FlurryWILE,Eron Goble  Subjective/Objective Assessment:     Action/Plan:   Anticipated DC Date:     Anticipated DC Plan:           Choice offered to / List presented to:             Status of service:   Medicare Important Message given?  YES (If response is "NO", the following Medicare IM given date fields will be blank) Date Medicare IM given:  03/02/2014 Medicare IM given by:  Ronny FlurryWILE,Therisa Mennella Date Additional Medicare IM given:   Additional Medicare IM given by:    Discharge Disposition:    Per UR Regulation:    If discussed at Long Length of Stay Meetings, dates discussed:   03/02/2014    Comments:  03-02-14 Patient wants to go home at discharge with home health . Patient states she lives with family who can assist her 224 / 7 and provide transportation to and from hemodialysis .   Ronny FlurryHeather Johncarlos Holtsclaw RN BSN    02-26-14 Prior to admission [facility] , if patient appropriate for home health at discharge , please enter home health orders to resume care . Thanks Ronny FlurryHeather Akiem Urieta RN BSN [phone]

## 2014-02-26 NOTE — Anesthesia Procedure Notes (Signed)
Procedure Name: MAC Date/Time: 02/26/2014 2:06 PM Performed by: Fransisca KaufmannMEYER, Jasmeet Gehl E Pre-anesthesia Checklist: Patient identified, Emergency Drugs available, Suction available, Patient being monitored and Timeout performed Patient Re-evaluated:Patient Re-evaluated prior to inductionOxygen Delivery Method: Nasal cannula Preoxygenation: Pre-oxygenation with 100% oxygen Intubation Type: IV induction Placement Confirmation: positive ETCO2

## 2014-02-27 DIAGNOSIS — R197 Diarrhea, unspecified: Secondary | ICD-10-CM

## 2014-02-27 DIAGNOSIS — I059 Rheumatic mitral valve disease, unspecified: Secondary | ICD-10-CM

## 2014-02-27 LAB — CBC
HCT: 29.8 % — ABNORMAL LOW (ref 36.0–46.0)
Hemoglobin: 10.6 g/dL — ABNORMAL LOW (ref 12.0–15.0)
MCH: 26.4 pg (ref 26.0–34.0)
MCHC: 35.6 g/dL (ref 30.0–36.0)
MCV: 74.3 fL — ABNORMAL LOW (ref 78.0–100.0)
PLATELETS: 227 10*3/uL (ref 150–400)
RBC: 4.01 MIL/uL (ref 3.87–5.11)
RDW: 14 % (ref 11.5–15.5)
WBC: 15.2 10*3/uL — AB (ref 4.0–10.5)

## 2014-02-27 LAB — BASIC METABOLIC PANEL
ANION GAP: 19 — AB (ref 5–15)
BUN: 81 mg/dL — ABNORMAL HIGH (ref 6–23)
CALCIUM: 7 mg/dL — AB (ref 8.4–10.5)
CO2: 19 mEq/L (ref 19–32)
Chloride: 92 mEq/L — ABNORMAL LOW (ref 96–112)
Creatinine, Ser: 3.19 mg/dL — ABNORMAL HIGH (ref 0.50–1.10)
GFR calc Af Amer: 15 mL/min — ABNORMAL LOW (ref >90)
GFR calc non Af Amer: 13 mL/min — ABNORMAL LOW (ref >90)
Glucose, Bld: 32 mg/dL — CL (ref 70–99)
Potassium: 3.3 mEq/L — ABNORMAL LOW (ref 3.7–5.3)
Sodium: 130 mEq/L — ABNORMAL LOW (ref 137–147)

## 2014-02-27 LAB — GLUCOSE, CAPILLARY
GLUCOSE-CAPILLARY: 44 mg/dL — AB (ref 70–99)
GLUCOSE-CAPILLARY: 67 mg/dL — AB (ref 70–99)
GLUCOSE-CAPILLARY: 67 mg/dL — AB (ref 70–99)
Glucose-Capillary: 28 mg/dL — CL (ref 70–99)
Glucose-Capillary: 92 mg/dL (ref 70–99)
Glucose-Capillary: 108 mg/dL — ABNORMAL HIGH (ref 70–99)
Glucose-Capillary: 66 mg/dL — ABNORMAL LOW (ref 70–99)
Glucose-Capillary: 58 mg/dL — ABNORMAL LOW (ref 70–99)
Glucose-Capillary: 107 mg/dL — ABNORMAL HIGH (ref 70–99)
Glucose-Capillary: 68 mg/dL — ABNORMAL LOW (ref 70–99)
Glucose-Capillary: 134 mg/dL — ABNORMAL HIGH (ref 70–99)

## 2014-02-27 LAB — HEPATITIS B SURFACE ANTIGEN: Hepatitis B Surface Ag: NEGATIVE

## 2014-02-27 LAB — HEPATITIS B SURFACE ANTIBODY,QUALITATIVE: Hep B S Ab: NEGATIVE

## 2014-02-27 LAB — HEPATITIS B CORE ANTIBODY, TOTAL: Hep B Core Total Ab: NONREACTIVE

## 2014-02-27 MED ORDER — DEXTROSE 50 % IV SOLN
INTRAVENOUS | Status: AC
  Filled 2014-02-27: qty 50

## 2014-02-27 MED ORDER — DEXTROSE 10 % IV SOLN
INTRAVENOUS | Status: DC
  Administered 2014-02-27 – 2014-03-02 (×2): via INTRAVENOUS
  Filled 2014-02-27 (×2): qty 1000

## 2014-02-27 MED ORDER — GLUCOSE 40 % PO GEL
1.0000 | Freq: Once | ORAL | Status: AC | PRN
  Administered 2014-02-27: 37.5 g via ORAL
  Filled 2014-02-27: qty 1

## 2014-02-27 MED ORDER — GLUCOSE 40 % PO GEL
ORAL | Status: AC
  Administered 2014-02-27: 37.5 g via ORAL
  Filled 2014-02-27: qty 1

## 2014-02-27 MED ORDER — GLUCOSE 40 % PO GEL
ORAL | Status: AC
  Administered 2014-02-27: 37.5 g
  Filled 2014-02-27: qty 1

## 2014-02-27 MED ORDER — LOPERAMIDE HCL 2 MG PO CAPS
2.0000 mg | ORAL_CAPSULE | ORAL | Status: DC | PRN
  Administered 2014-02-27 – 2014-02-28 (×4): 2 mg via ORAL
  Filled 2014-02-27 (×4): qty 1

## 2014-02-27 NOTE — Progress Notes (Signed)
[facility] PROGRESS NOTE  Sierra MCGLAUGHLIN AVW:[id number] DOB: April 13, 1936 DOA: 02/24/2014 PCP: Sherrie Mustache, MD  Assessment/Plan: 78 y.o. female with PMH of CAD s/p CABG, ? S/p MVR, DM, PAF, PAD, CKD 4-5, Gout, Recent cellulitis, recently discharged from the hospital on 10/10 after being treated for cellulitis and ARF, comes in for worsening pedal edema, and persistent cellulitis changes, and diarrhea   1. Chronic cellulitis, dermatitis, skin ulcers due to chronic leg edema, PAD; sirs/sepsis on admisison -improving on atx; blood cultures NGTD; afebrile; down titrate atx as possible   2. ESRD, Hyponatremia, AG met acidosis likely due CKD; US renal: Atrophic kidneys  -10/16: s/p right IJ Diatek HD cath; 10/16 started HD -appreciate vascular surgery, nephrology input;  3. CAD s/p CABG, ? S/p MVR; patient denies any chest pain; cont monitor; added ASA 4. PAF, patient declined anticoagulation; HR stable; not on BB; started ASA 5. Probable CHF; pend echo; clinically stable; HD for volume control  6. DM, hypoglycemia likely due to PO glipizide; d/c glipizide; resume temporary on d10 drip; monitor  7. Diarrhea probably atx associated, resolving;  c diff negative;    Code Status: full Family Communication: d/w patient, called family Sierra, [name] Other [phone] no answer; tried [phone] provided by patient no answer; try later   (indicate person spoken with, relationship, and if by phone, the number) Disposition Plan: pend clinical improvement; CIR   Consultants:  Nephrology   Procedures:  none  Antibiotics:  Zosyn 10/14<<  vanc 10/14<<  Metronidazole 10/15<<<   (indicate start date, and stop date if known)  HPI/Subjective: alert  Objective: Filed Vitals:   02/27/14 0455  BP: 123/55  Pulse: 61  Temp: 97.3 F (36.3 C)  Resp: 18    Intake/Output Summary (Last 24 hours) at 02/27/14 0839 Last data filed at 02/27/14 0057  Gross per 24 hour  Intake    370  ml  Output     20 ml  Net    350 ml   Filed Weights   02/24/14 1130 02/26/14 2224 02/27/14 0057  Weight: 74.1 kg (163 lb 5.8 oz) 81.7 kg (180 lb 1.9 oz) 81.7 kg (180 lb 1.9 oz)    Exam:   General:  alert  Cardiovascular: s1,s2 +JVD  Respiratory: few crackles   Abdomen: soft, nt,dn   Musculoskeletal: no chronic dermatitis, edema, ulcers    Data Reviewed: Basic Metabolic Panel:  Recent Labs Lab 02/24/14 1105 02/24/14 1123 02/25/14 0443 02/26/14 0600 02/27/14 0338  NA 126* 122* 126* 122* 130*  K 4.3 4.1 4.3 4.3 3.3*  CL 87* 93* 90* 86* 92*  CO2 15*  --  14* 13* 19  GLUCOSE 94 95 56* 286* 32*  BUN 126* 128* 123* 122* 81*  CREATININE 4.35* 4.80* 4.33* 4.41* 3.19*  CALCIUM 7.4*  --  7.1* 6.6* 7.0*   Liver Function Tests:  Recent Labs Lab 02/24/14 1105  AST 48*  ALT 45*  ALKPHOS 190*  BILITOT 0.7  PROT 5.3*  ALBUMIN 2.0*   No results found for this basename: LIPASE, AMYLASE,  in the last 168 hours No results found for this basename: AMMONIA,  in the last 168 hours CBC:  Recent Labs Lab 02/24/14 1105 02/24/14 1123 02/25/14 0443 02/27/14 0338  WBC 10.3  --  13.0* 15.2*  NEUTROABS 9.6*  --   --   --   HGB 11.6* 12.9 10.4* 10.6*  HCT 32.8* 38.0 29.5* 29.8*  MCV 77.2*  --  77.0* 74.3*  PLT 242  --  226 227   Cardiac Enzymes:  Recent Labs Lab 02/24/14 1105  TROPONINI <0.30   BNP (last 3 results) No results found for this basename: PROBNP,  in the last 8760 hours CBG:  Recent Labs Lab 02/26/14 2143 02/27/14 0434 02/27/14 0452 02/27/14 0522 02/27/14 0755  GLUCAP 119* 28* 44* 92 66*    Recent Results (from the past 240 hour(s))  URINE CULTURE     Status: None   Collection Time    02/24/14 10:51 AM      Result Value Ref Range Status   Specimen Description URINE, CATHETERIZED   Final   Special Requests NONE   Final   Culture  Setup Time     Final   Value: 02/24/2014 12:18     Performed at [place]     Final    Value: NO GROWTH     Performed at [facility]   Culture     Final   Value: NO GROWTH     Performed at [facility]   Report Status 02/25/2014 FINAL   Final  CULTURE, BLOOD (ROUTINE X 2)     Status: None   Collection Time    02/24/14 11:05 AM      Result Value Ref Range Status   Specimen Description BLOOD RIGHT ARM   Final   Special Requests BOTTLES DRAWN AEROBIC AND ANAEROBIC 10ML   Final   Culture  Setup Time     Final   Value: 02/24/2014 17:27     Performed at [facility]   Culture     Final   Value:        BLOOD CULTURE RECEIVED NO GROWTH TO DATE CULTURE WILL BE HELD FOR 5 DAYS BEFORE ISSUING A FINAL NEGATIVE REPORT     Performed at [facility]   Report Status PENDING   Incomplete  CULTURE, BLOOD (ROUTINE X 2)     Status: None   Collection Time    02/24/14 11:45 AM      Result Value Ref Range Status   Specimen Description BLOOD LEFT ARM   Final   Special Requests BOTTLES DRAWN AEROBIC AND ANAEROBIC 10ML   Final   Culture  Setup Time     Final   Value: 02/24/2014 17:29     Performed at [facility]   Culture     Final   Value:        BLOOD CULTURE RECEIVED NO GROWTH TO DATE CULTURE WILL BE HELD FOR 5 DAYS BEFORE ISSUING A FINAL NEGATIVE REPORT     Performed at [facility]   Report Status PENDING   Incomplete  CLOSTRIDIUM DIFFICILE BY PCR     Status: None   Collection Time    02/25/14  4:56 AM      Result Value Ref Range Status   C difficile by pcr NEGATIVE  NEGATIVE Final     Studies: Dg Chest Port 1 View  02/26/2014   CLINICAL DATA:  Status post insertion of a dialysis catheter on the right.  EXAM: PORTABLE CHEST - 1 VIEW  COMPARISON:  Single view of the chest 02/24/2014.  FINDINGS: Right IJ approach double lumen central venous catheter is in place. Tip of the distal lumen projects in the right atrium with the proximal lumen just below the superior cavoatrial junction. There is cardiomegaly and vascular congestion. No  pneumothorax. Trace right pleural effusion is noted.  IMPRESSION: Dialysis catheter in  place as above.  Negative for pneumothorax.  Cardiomegaly and vascular congestion.  Trace right pleural effusion.   Electronically Signed   By: Inge Rise M.D.   On: 02/26/2014 15:30    Scheduled Meds: . aspirin EC  81 mg Oral Daily  . collagenase   Topical Daily  . dextrose      . feeding supplement (PRO-STAT SUGAR FREE 64)  30 mL Oral TID WC  . heparin subcutaneous  5,000 Units Subcutaneous 3 times per day  . insulin aspart  0-9 Units Subcutaneous TID WC  . piperacillin-tazobactam (ZOSYN)  IV  2.25 g Intravenous 4 times per day  . vancomycin  1,000 mg Intravenous Q48H   Continuous Infusions: . sodium chloride 10 mL/hr at 02/26/14 1340    Active Problems:   MITRAL REGURGITATION   Essential hypertension   Acute on chronic renal failure   DM type 2 causing renal disease   CKD (chronic kidney disease) stage 4, GFR 15-29 ml/min   SIRS (systemic inflammatory response syndrome)   Hypothermia    Time spent: >35 minutes     Kinnie Feil  [facility] Pager [phone]. If 7PM-7AM, please contact night-coverage at [email or web address], password [facility] 02/27/2014, 8:39 AM  LOS: 3 days

## 2014-02-27 NOTE — Progress Notes (Signed)
Echo Lab  2D Echocardiogram completed.  Torey Reinard L Trinton Prewitt, RDCS 02/27/2014 1:56 PM

## 2014-02-27 NOTE — Procedures (Signed)
Patient was seen on dialysis and the procedure was supervised. BFR 250 Via RIJ TDC BP is 127/50.  Patient appears to be tolerating treatment well.

## 2014-02-27 NOTE — Progress Notes (Signed)
Hypoglycemic Event  CBG: 67  Treatment: 15 GM carbohydrate snack  Symptoms: None  Follow-up CBG: Time:1156 CBG Result:58  Possible Reasons for Event: Inadequate meal intake  Comments/MD notified:yes    Jed Limerickross, Young Berryara S  Remember to initiate Hypoglycemia Order Set & complete

## 2014-02-27 NOTE — Progress Notes (Signed)
Hypoglycemic Event  CBG: 68  Treatment: 1 tube instant glucose  Symptoms: None  Follow-up CBG: Time:1316 CBG Result: 107  Possible Reasons for Event: Inadequate meal intake  Comments/MD notified:yes    Jed Limerickross, Young Berryara S  Remember to initiate Hypoglycemia Order Set & complete

## 2014-02-28 DIAGNOSIS — I5021 Acute systolic (congestive) heart failure: Secondary | ICD-10-CM

## 2014-02-28 LAB — GLUCOSE, CAPILLARY
Glucose-Capillary: 126 mg/dL — ABNORMAL HIGH (ref 70–99)
Glucose-Capillary: 88 mg/dL (ref 70–99)
Glucose-Capillary: 111 mg/dL — ABNORMAL HIGH (ref 70–99)
Glucose-Capillary: 143 mg/dL — ABNORMAL HIGH (ref 70–99)
Glucose-Capillary: 133 mg/dL — ABNORMAL HIGH (ref 70–99)

## 2014-02-28 LAB — BASIC METABOLIC PANEL
Anion gap: 17 — ABNORMAL HIGH (ref 5–15)
BUN: 56 mg/dL — ABNORMAL HIGH (ref 6–23)
CALCIUM: 6.9 mg/dL — AB (ref 8.4–10.5)
CO2: 21 meq/L (ref 19–32)
Chloride: 96 mEq/L (ref 96–112)
Creatinine, Ser: 2.56 mg/dL — ABNORMAL HIGH (ref 0.50–1.10)
GFR calc Af Amer: 20 mL/min — ABNORMAL LOW (ref >90)
GFR calc non Af Amer: 17 mL/min — ABNORMAL LOW (ref >90)
Glucose, Bld: 98 mg/dL (ref 70–99)
Potassium: 3.6 mEq/L — ABNORMAL LOW (ref 3.7–5.3)
SODIUM: 134 meq/L — AB (ref 137–147)

## 2014-02-28 MED ORDER — VANCOMYCIN HCL 10 G IV SOLR
1500.0000 mg | Freq: Once | INTRAVENOUS | Status: AC
  Administered 2014-02-28: 1500 mg via INTRAVENOUS
  Filled 2014-02-28: qty 1500

## 2014-02-28 MED ORDER — PIPERACILLIN-TAZOBACTAM IN DEX 2-0.25 GM/50ML IV SOLN
2.2500 g | Freq: Three times a day (TID) | INTRAVENOUS | Status: DC
  Administered 2014-02-28 – 2014-03-01 (×3): 2.25 g via INTRAVENOUS
  Filled 2014-02-28 (×5): qty 50

## 2014-02-28 NOTE — Progress Notes (Signed)
ANTIBIOTIC CONSULT NOTE - follow up Pharmacy Consult:  Vancomycin / Zosyn Indication:  Sepsis, cellulitis    Allergies  Allergen Reactions  . Sulfonamide Derivatives Itching  . Codeine Rash    Patient Measurements: Height: 5' 4.96" (165 cm) Weight: 174 lb 2.6 oz (79 kg) IBW/kg (Calculated) : 56.91  Vital Signs: Temp: 97.9 F (36.6 C) (10/18 0445) Temp Source: Oral (10/18 0445) BP: 110/54 mmHg (10/18 0445) Pulse Rate: 68 (10/18 0445) Intake/Output from this shift: Total I/O In: 100 [P.O.:100] Out: -   Labs:  Recent Labs  02/26/14 0600 02/27/14 0338 02/28/14 0416  WBC  --  15.2*  --   HGB  --  10.6*  --   PLT  --  227  --   CREATININE 4.41* 3.19* 2.56*   Estimated Creatinine Clearance: 19.4 ml/min (by C-G formula based on Cr of 2.56). No results found for this basename: VANCOTROUGH, VANCOPEAK, VANCORANDOM, GENTTROUGH, GENTPEAK, GENTRANDOM, TOBRATROUGH, TOBRAPEAK, TOBRARND, AMIKACINPEAK, AMIKACINTROU, AMIKACIN,  in the last 72 hours   Microbiology: Recent Results (from the past 720 hour(s))  CLOSTRIDIUM DIFFICILE BY PCR     Status: None   Collection Time    02/17/14  1:04 AM      Result Value Ref Range Status   C difficile by pcr NEGATIVE  NEGATIVE Final  URINE CULTURE     Status: None   Collection Time    02/24/14 10:51 AM      Result Value Ref Range Status   Specimen Description URINE, CATHETERIZED   Final   Special Requests NONE   Final   Culture  Setup Time     Final   Value: 02/24/2014 12:18     [facility]   [place] Count     Final   Value: NO GROWTH     [facility]   Culture     Final   Value: NO GROWTH     [facility]   Report Status 02/25/2014 FINAL   Final  CULTURE, BLOOD (ROUTINE X 2)     Status: None   Collection Time    02/24/14 11:05 AM      Result Value Ref Range Status   Specimen Description BLOOD RIGHT ARM   Final   Special Requests BOTTLES DRAWN AEROBIC AND ANAEROBIC  10ML   Final   Culture  Setup Time     Final   Value: 02/24/2014 17:27     [facility]   Culture     Final   Value:        BLOOD CULTURE RECEIVED NO GROWTH TO DATE CULTURE WILL BE HELD FOR 5 DAYS BEFORE ISSUING A FINAL NEGATIVE REPORT     [facility]   Report Status PENDING   Incomplete  CULTURE, BLOOD (ROUTINE X 2)     Status: None   Collection Time    02/24/14 11:45 AM      Result Value Ref Range Status   Specimen Description BLOOD LEFT ARM   Final   Special Requests BOTTLES DRAWN AEROBIC AND ANAEROBIC 10ML   Final   Culture  Setup Time     Final   Value: 02/24/2014 17:29     [facility]   Culture     Final   Value:        BLOOD CULTURE RECEIVED NO GROWTH TO DATE CULTURE WILL BE HELD FOR 5 DAYS BEFORE ISSUING  A FINAL NEGATIVE REPORT     [facility]   Report Status PENDING   Incomplete  CLOSTRIDIUM DIFFICILE BY PCR     Status: None   Collection Time    02/25/14  4:56 AM      Result Value Ref Range Status   C difficile by pcr NEGATIVE  NEGATIVE Final    Assessment: [age] YOF recently discharged from [facility] started vancomycin and Zosyn for sepsis on 02/24/14.  Pt not seen by pharmacy on 10/16 and 10/17 possibly due to being off 6N (transferred to periop x 2 10/16 and HD 10/16 and 10/17).  Dr. York SpanielBuriev informed that pt received vancomycin on 10/14 and none given post-HD on 10/16 or 10/17.  He feels cellulitis is improving and wants to give 1 dose of vancomycin today and check CBC tomorrow with plans to down titrate abx..  New ESRD, first HD 10/16 and 10/17, to get HD #3 tomorrow.    Vanc 10/14 >> Zosyn 10/14 >> Doxy PTA  10/14 Ucx NGF 101/5 Cdiff neg 10/14 BCx2 ngtd   Goal of Therapy:  vanc pre-HD level 15-25 mcg/ml   Plan:  -change zosyn to 2.25 gm IV q8h -vancomcyin 1500 mg IV x 1 dose now -f/u abx plans, per Dr York SpanielBuriev may narrow abx in am  -if vanc to continue will need 750 IV after each  HD  Herby AbrahamMichelle T. Ladena Jacquez, Pharm.D. [phone] 02/28/2014 10:27 AM

## 2014-02-28 NOTE — Progress Notes (Signed)
Patient ID: [name], female   DOB: 1936-03-15, 78 y.o.   MRN: [id number]  [facility] Progress Note    Subjective:   Sitting up in chair "my butt hurts"  "when can I go home?"    Objective:   BP 110/54  Pulse 68  Temp(Src) 97.9 F (36.6 C) (Oral)  Resp 16  Ht 5' 4.96" (1.65 m)  Wt 79 kg (174 lb 2.6 oz)  BMI 29.02 kg/m2  SpO2 97%  Intake/Output: I/O last 3 completed shifts: In: 240 [P.O.:240] Out: 0    Intake/Output this shift:    Weight change: -2.7 kg (-5 lb 15.2 oz)  Physical Exam: WGN:FAOZHGen:frail, chronically ill-appearing WF CVS:no rub PC right chest Resp:occ rhonchi YQM:VHQIONAbd:benign Ext:no edema  Labs: BMET  Recent Labs Lab 02/24/14 1105 02/24/14 1123 02/25/14 0443 02/26/14 0600 02/27/14 0338 02/28/14 0416  NA 126* 122* 126* 122* 130* 134*  K 4.3 4.1 4.3 4.3 3.3* 3.6*  CL 87* 93* 90* 86* 92* 96  CO2 15*  --  14* 13* 19 21  GLUCOSE 94 95 56* 286* 32* 98  BUN 126* 128* 123* 122* 81* 56*  CREATININE 4.35* 4.80* 4.33* 4.41* 3.19* 2.56*  ALBUMIN 2.0*  --   --   --   --   --   CALCIUM 7.4*  --  7.1* 6.6* 7.0* 6.9*   CBC  Recent Labs Lab 02/24/14 1105 02/24/14 1123 02/25/14 0443 02/27/14 0338  WBC 10.3  --  13.0* 15.2*  NEUTROABS 9.6*  --   --   --   HGB 11.6* 12.9 10.4* 10.6*  HCT 32.8* 38.0 29.5* 29.8*  MCV 77.2*  --  77.0* 74.3*  PLT 242  --  226 227    @IMGRELPRIORS @ Medications:    . aspirin EC  81 mg Oral Daily  . collagenase   Topical Daily  . feeding supplement (PRO-STAT SUGAR FREE 64)  30 mL Oral TID WC  . heparin subcutaneous  5,000 Units Subcutaneous 3 times per day  . insulin aspart  0-9 Units Subcutaneous TID WC  . piperacillin-tazobactam (ZOSYN)  IV  2.25 g Intravenous 4 times per day  . vancomycin  1,000 mg Intravenous Q48H     Assessment/ Plan:   1. Cellulitis- on vanc/zosyn per primary team 2. ESRD-  [facility] - done on 10/16 and 10/17-  AVF/AVG to be placed by [facility] as  the schedule allows.  [place] in [place] and [place] need [place] in [place]- I am not sure if anything has been done on this yet- will start tomorrow.  Will also get HD #3 tomorrow added K bath  3. Hyponatremia- correcting with HD 4. Anemia:will initiate epo there are no iron stores pending- will order for tomorrow 5. CKD-MBD: low calcium- PTH is 257- no phos checked yet, will order for tomorrow 6. Nutrition:protein malnutrition.  Encourage protein and follow after HD 7. Hypertension:stable 8. Vascular access- as above 9. Dispo- will not be able to go until OP HD arranged and preferably has permanent access in place   Elzie Sheets A 02/28/2014, 9:07 AM

## 2014-02-28 NOTE — Progress Notes (Signed)
[facility] PROGRESS NOTE  [name] WUJ:[id number] DOB: June 03, 1935 DOA: 02/24/2014 PCP: Sherrie Mustache, MD  Assessment/Plan: 78 y.o. female with PMH of CAD s/p CABG, S/p MVR, DM, PAF, PAD, CKD 4-5, Gout, Recent cellulitis, recently discharged from the hospital on 10/10 after being treated for cellulitis and ARF, comes in for worsening pedal edema, and persistent cellulitis changes, and diarrhea, CHF, ESRD  1. Chronic cellulitis, dermatitis, skin ulcers due to chronic leg edema, PAD; sirs/sepsis on admisison -improving on atx; blood cultures NGTD; afebrile; down titrate atx on 10/19 if cbc looks better   2. ESRD, Hyponatremia, AG met acidosis likely due CKD; US renal: Atrophic kidneys  -10/16: s/p right IJ Diatek HD cath; started HD had 2 dialysis  -appreciate vascular surgery, nephrology input;  3. CAD s/p CABG, patient denies any chest pain; cont monitor; added ASA 4. PAF, patient declined anticoagulation; HR stable; not on BB; started ASA 5. Acute on chronic CHF (systolic HF); echo: LVEF 95-62%; clinically  -clinically improving' cont HD for volume control  6. DM, hypoglycemia likely due to PO glipizide; d/c glipizide; resume temporary on d10 drip; monitor  7. Diarrhea probably atx associated, deescalate atx as  Possible;  c diff negative;    Code Status: full Family Communication: d/w patient, called family Jashiya, Bassett Other [phone] no answer; tried [phone] provided by patient no answer; try later   (indicate person spoken with, relationship, and if by phone, the number) Disposition Plan: pend clinical improvement; CIR   Consultants:  Nephrology   Procedures:  none  Antibiotics:  Zosyn 10/14<<  vanc 10/14<<  Metronidazole 10/15<<<   (indicate start date, and stop date if known)  HPI/Subjective: alert  Objective: Filed Vitals:   02/28/14 0445  BP: 110/54  Pulse: 68  Temp: 97.9 F (36.6 C)  Resp: 16    Intake/Output Summary (Last 24  hours) at 02/28/14 0956 Last data filed at 02/28/14 0931  Gross per 24 hour  Intake    220 ml  Output      0 ml  Net    220 ml   Filed Weights   02/27/14 0057 02/27/14 1442 02/27/14 1727  Weight: 81.7 kg (180 lb 1.9 oz) 79 kg (174 lb 2.6 oz) 79 kg (174 lb 2.6 oz)    Exam:   General:  alert  Cardiovascular: s1,s2 +JVD  Respiratory: few crackles   Abdomen: soft, nt,dn   Musculoskeletal: no chronic dermatitis, edema, ulcers    Data Reviewed: Basic Metabolic Panel:  Recent Labs Lab 02/24/14 1105 02/24/14 1123 02/25/14 0443 02/26/14 0600 02/27/14 0338 02/28/14 0416  NA 126* 122* 126* 122* 130* 134*  K 4.3 4.1 4.3 4.3 3.3* 3.6*  CL 87* 93* 90* 86* 92* 96  CO2 15*  --  14* 13* 19 21  GLUCOSE 94 95 56* 286* 32* 98  BUN 126* 128* 123* 122* 81* 56*  CREATININE 4.35* 4.80* 4.33* 4.41* 3.19* 2.56*  CALCIUM 7.4*  --  7.1* 6.6* 7.0* 6.9*   Liver Function Tests:  Recent Labs Lab 02/24/14 1105  AST 48*  ALT 45*  ALKPHOS 190*  BILITOT 0.7  PROT 5.3*  ALBUMIN 2.0*   No results found for this basename: LIPASE, AMYLASE,  in the last 168 hours No results found for this basename: AMMONIA,  in the last 168 hours CBC:  Recent Labs Lab 02/24/14 1105 02/24/14 1123 02/25/14 0443 02/27/14 0338  WBC 10.3  --  13.0* 15.2*  NEUTROABS 9.6*  --   --   --  HGB 11.6* 12.9 10.4* 10.6*  HCT 32.8* 38.0 29.5* 29.8*  MCV 77.2*  --  77.0* 74.3*  PLT 242  --  226 227   Cardiac Enzymes:  Recent Labs Lab 02/24/14 1105  TROPONINI <0.30   BNP (last 3 results) No results found for this basename: PROBNP,  in the last 8760 hours CBG:  Recent Labs Lab 02/27/14 1316 02/27/14 1812 02/27/14 2141 02/28/14 0118 02/28/14 0815  GLUCAP 107* 67* 134* 126* 88    Recent Results (from the past 240 hour(s))  URINE CULTURE     Status: None   Collection Time    02/24/14 10:51 AM      Result Value Ref Range Status   Specimen Description URINE, CATHETERIZED   Final   Special  Requests NONE   Final   Culture  Setup Time     Final   Value: 02/24/2014 12:18     Performed at [place] [facility]     Final   Value: NO GROWTH     Performed at [facility]   Culture     Final   Value: NO GROWTH     Performed at [facility]   Report Status 02/25/2014 FINAL   Final  CULTURE, BLOOD (ROUTINE X 2)     Status: None   Collection Time    02/24/14 11:05 AM      Result Value Ref Range Status   Specimen Description BLOOD RIGHT ARM   Final   Special Requests BOTTLES DRAWN AEROBIC AND ANAEROBIC 10ML   Final   Culture  Setup Time     Final   Value: 02/24/2014 17:27     Performed at [facility]   Culture     Final   Value:        BLOOD CULTURE RECEIVED NO GROWTH TO DATE CULTURE WILL BE HELD FOR 5 DAYS BEFORE ISSUING A FINAL NEGATIVE REPORT     Performed at [facility]   Report Status PENDING   Incomplete  CULTURE, BLOOD (ROUTINE X 2)     Status: None   Collection Time    02/24/14 11:45 AM      Result Value Ref Range Status   Specimen Description BLOOD LEFT ARM   Final   Special Requests BOTTLES DRAWN AEROBIC AND ANAEROBIC 10ML   Final   Culture  Setup Time     Final   Value: 02/24/2014 17:29     Performed at [facility]   Culture     Final   Value:        BLOOD CULTURE RECEIVED NO GROWTH TO DATE CULTURE WILL BE HELD FOR 5 DAYS BEFORE ISSUING A FINAL NEGATIVE REPORT     Performed at [facility]   Report Status PENDING   Incomplete  CLOSTRIDIUM DIFFICILE BY PCR     Status: None   Collection Time    02/25/14  4:56 AM      Result Value Ref Range Status   C difficile by pcr NEGATIVE  NEGATIVE Final     Studies: Dg Chest Port 1 View  02/26/2014   CLINICAL DATA:  Status post insertion of a dialysis catheter on the right.  EXAM: PORTABLE CHEST - 1 VIEW  COMPARISON:  Single view of the chest 02/24/2014.  FINDINGS: Right IJ approach double lumen central venous catheter is in place. Tip of the distal lumen  projects in the right atrium with the proximal lumen  just below the superior cavoatrial junction. There is cardiomegaly and vascular congestion. No pneumothorax. Trace right pleural effusion is noted.  IMPRESSION: Dialysis catheter in place as above.  Negative for pneumothorax.  Cardiomegaly and vascular congestion.  Trace right pleural effusion.   Electronically Signed   By: Inge Rise M.D.   On: 02/26/2014 15:30    Scheduled Meds: . aspirin EC  81 mg Oral Daily  . collagenase   Topical Daily  . feeding supplement (PRO-STAT SUGAR FREE 64)  30 mL Oral TID WC  . heparin subcutaneous  5,000 Units Subcutaneous 3 times per day  . insulin aspart  0-9 Units Subcutaneous TID WC  . piperacillin-tazobactam (ZOSYN)  IV  2.25 g Intravenous 4 times per day  . vancomycin  1,000 mg Intravenous Q48H   Continuous Infusions: . sodium chloride 10 mL/hr at 02/26/14 1340  . dextrose 30 mL/hr at 02/27/14 1906    Active Problems:   MITRAL REGURGITATION   Essential hypertension   Acute on chronic renal failure   DM type 2 causing renal disease   CKD (chronic kidney disease) stage 4, GFR 15-29 ml/min   SIRS (systemic inflammatory response syndrome)   Hypothermia    Time spent: >35 minutes     Kinnie Feil  [facility] Pager [phone]. If 7PM-7AM, please contact night-coverage at [email or web address], password [facility] 02/28/2014, 9:56 AM  LOS: 4 days

## 2014-03-01 DIAGNOSIS — G7281 Critical illness myopathy: Secondary | ICD-10-CM

## 2014-03-01 DIAGNOSIS — Z01810 Encounter for preprocedural cardiovascular examination: Secondary | ICD-10-CM

## 2014-03-01 LAB — RENAL FUNCTION PANEL
ALBUMIN: 1.6 g/dL — AB (ref 3.5–5.2)
ANION GAP: 17 — AB (ref 5–15)
BUN: 64 mg/dL — ABNORMAL HIGH (ref 6–23)
CALCIUM: 7 mg/dL — AB (ref 8.4–10.5)
CO2: 21 mEq/L (ref 19–32)
Chloride: 94 mEq/L — ABNORMAL LOW (ref 96–112)
Creatinine, Ser: 3.08 mg/dL — ABNORMAL HIGH (ref 0.50–1.10)
GFR, EST AFRICAN AMERICAN: 16 mL/min — AB (ref >90)
GFR, EST NON AFRICAN AMERICAN: 14 mL/min — AB (ref >90)
GLUCOSE: 104 mg/dL — AB (ref 70–99)
POTASSIUM: 3.5 meq/L — AB (ref 3.7–5.3)
Phosphorus: 4.4 mg/dL (ref 2.3–4.6)
SODIUM: 132 meq/L — AB (ref 137–147)

## 2014-03-01 LAB — BASIC METABOLIC PANEL
Anion gap: 18 — ABNORMAL HIGH (ref 5–15)
BUN: 61 mg/dL — AB (ref 6–23)
CALCIUM: 6.9 mg/dL — AB (ref 8.4–10.5)
CO2: 19 mEq/L (ref 19–32)
Chloride: 96 mEq/L (ref 96–112)
Creatinine, Ser: 2.96 mg/dL — ABNORMAL HIGH (ref 0.50–1.10)
GFR calc Af Amer: 17 mL/min — ABNORMAL LOW (ref >90)
GFR, EST NON AFRICAN AMERICAN: 14 mL/min — AB (ref >90)
Glucose, Bld: 49 mg/dL — ABNORMAL LOW (ref 70–99)
Potassium: 3.8 mEq/L (ref 3.7–5.3)
Sodium: 133 mEq/L — ABNORMAL LOW (ref 137–147)

## 2014-03-01 LAB — IRON AND TIBC
Iron: 42 ug/dL (ref 42–135)
SATURATION RATIOS: 30 % (ref 20–55)
TIBC: 140 ug/dL — ABNORMAL LOW (ref 250–470)
UIBC: 98 ug/dL — ABNORMAL LOW (ref 125–400)

## 2014-03-01 LAB — GLUCOSE, CAPILLARY
GLUCOSE-CAPILLARY: 112 mg/dL — AB (ref 70–99)
GLUCOSE-CAPILLARY: 102 mg/dL — AB (ref 70–99)
Glucose-Capillary: 79 mg/dL (ref 70–99)
Glucose-Capillary: 43 mg/dL — CL (ref 70–99)
Glucose-Capillary: 99 mg/dL (ref 70–99)
Glucose-Capillary: 108 mg/dL — ABNORMAL HIGH (ref 70–99)

## 2014-03-01 LAB — CBC
HCT: 27.9 % — ABNORMAL LOW (ref 36.0–46.0)
HEMATOCRIT: 29.4 % — AB (ref 36.0–46.0)
HEMOGLOBIN: 9.7 g/dL — AB (ref 12.0–15.0)
Hemoglobin: 10.2 g/dL — ABNORMAL LOW (ref 12.0–15.0)
MCH: 27.1 pg (ref 26.0–34.0)
MCH: 27.5 pg (ref 26.0–34.0)
MCHC: 34.7 g/dL (ref 30.0–36.0)
MCHC: 34.8 g/dL (ref 30.0–36.0)
MCV: 78.2 fL (ref 78.0–100.0)
MCV: 79 fL (ref 78.0–100.0)
PLATELETS: 153 10*3/uL (ref 150–400)
Platelets: 153 10*3/uL (ref 150–400)
RBC: 3.76 MIL/uL — AB (ref 3.87–5.11)
RBC: 3.53 MIL/uL — ABNORMAL LOW (ref 3.87–5.11)
RDW: 15 % (ref 11.5–15.5)
RDW: 15.1 % (ref 11.5–15.5)
WBC: 16 10*3/uL — ABNORMAL HIGH (ref 4.0–10.5)
WBC: 16.9 10*3/uL — ABNORMAL HIGH (ref 4.0–10.5)

## 2014-03-01 LAB — FERRITIN: Ferritin: 341 ng/mL — ABNORMAL HIGH (ref 10–291)

## 2014-03-01 MED ORDER — DOXYCYCLINE HYCLATE 100 MG PO TABS
100.0000 mg | ORAL_TABLET | Freq: Two times a day (BID) | ORAL | Status: DC
  Administered 2014-03-01 – 2014-03-04 (×7): 100 mg via ORAL
  Filled 2014-03-01 (×8): qty 1

## 2014-03-01 NOTE — Progress Notes (Signed)
Patient ID: Charlynne PanderLorine E Harbin, female    DOB: Sep 03, 1935, 78 y.o.   MRN: [id number]  S: Reports diarrhea, overall improved.  O:BP 100/79  Pulse 64  Temp(Src) 97.7 F (36.5 C) (Oral)  Resp 16  Ht 5' 4.96" (1.65 m)  Wt 174 lb 2.6 oz (79 kg)  BMI 29.02 kg/m2  SpO2 99%  Intake/Output Summary (Last 24 hours) at 03/01/14 1233 Last data filed at 02/28/14 1836  Gross per 24 hour  Intake   1197 ml  Output      0 ml  Net   1197 ml   Intake/Output: I/O last 3 completed shifts: In: 1297 [P.O.:218; I.V.:1029; IV Piggyback:50] Out: -   Intake/Output this shift:    Weight change:  Gen: NAD CVS: RRR, no m/r/g. PC right chest Resp: CTAB Abd: soft, +BS Ext: trace edema, bandage right ankle. Skin: LE cellulitis   Recent Labs Lab 02/24/14 1105 02/24/14 1123 02/25/14 0443 02/26/14 0600 02/27/14 0338 02/28/14 0416 03/01/14 0730  NA 126* 122* 126* 122* 130* 134* 133*  K 4.3 4.1 4.3 4.3 3.3* 3.6* 3.8  CL 87* 93* 90* 86* 92* 96 96  CO2 15*  --  14* 13* 19 21 19   GLUCOSE 94 95 56* 286* 32* 98 49*  BUN 126* 128* 123* 122* 81* 56* 61*  CREATININE 4.35* 4.80* 4.33* 4.41* 3.19* 2.56* 2.96*  ALBUMIN 2.0*  --   --   --   --   --   --   CALCIUM 7.4*  --  7.1* 6.6* 7.0* 6.9* 6.9*  AST 48*  --   --   --   --   --   --   ALT 45*  --   --   --   --   --   --    Liver Function Tests:  Recent Labs Lab 02/24/14 1105  AST 48*  ALT 45*  ALKPHOS 190*  BILITOT 0.7  PROT 5.3*  ALBUMIN 2.0*   No results found for this basename: LIPASE, AMYLASE,  in the last 168 hours No results found for this basename: AMMONIA,  in the last 168 hours CBC:  Recent Labs Lab 02/24/14 1105  02/25/14 0443 02/27/14 0338 03/01/14 0730  WBC 10.3  --  13.0* 15.2* 16.0*  NEUTROABS 9.6*  --   --   --   --   HGB 11.6*  < > 10.4* 10.6* 10.2*  HCT 32.8*  < > 29.5* 29.8* 29.4*  MCV 77.2*  --  77.0* 74.3* 78.2  PLT 242  --  226 227 153  < > = values in this interval not displayed. Cardiac Enzymes:  Recent  Labs Lab 02/24/14 1105  TROPONINI <0.30   CBG:  Recent Labs Lab 02/28/14 1729 02/28/14 2105 03/01/14 0739 03/01/14 0822 03/01/14 1138  GLUCAP 143* 133* 43* 99 112*    Iron Studies: No results found for this basename: IRON, TIBC, TRANSFERRIN, FERRITIN,  in the last 72 hours Studies/Results: No results found. Marland Kitchen. aspirin EC  81 mg Oral Daily  . collagenase   Topical Daily  . doxycycline  100 mg Oral Q12H  . feeding supplement (PRO-STAT SUGAR FREE 64)  30 mL Oral TID WC  . heparin subcutaneous  5,000 Units Subcutaneous 3 times per day  . insulin aspart  0-9 Units Subcutaneous TID WC    BMET    Component Value Date/Time   NA 133* 03/01/2014 0730   K 3.8 03/01/2014 0730   CL 96 03/01/2014 0730  CO2 19 03/01/2014 0730   GLUCOSE 49* 03/01/2014 0730   BUN 61* 03/01/2014 0730   CREATININE 2.96* 03/01/2014 0730   CALCIUM 6.9* 03/01/2014 0730   GFRNONAA 14* 03/01/2014 0730   GFRAA 17* 03/01/2014 0730   CBC    Component Value Date/Time   WBC 16.0* 03/01/2014 0730   RBC 3.76* 03/01/2014 0730   HGB 10.2* 03/01/2014 0730   HCT 29.4* 03/01/2014 0730   PLT 153 03/01/2014 0730   MCV 78.2 03/01/2014 0730   MCH 27.1 03/01/2014 0730   MCHC 34.7 03/01/2014 0730   RDW 15.0 03/01/2014 0730   LYMPHSABS 0.4* 02/24/2014 1105   MONOABS 0.3 02/24/2014 1105   EOSABS 0.0 02/24/2014 1105   BASOSABS 0.0 02/24/2014 1105     Assessment/Plan:  [facility]. [facility]  2. Hyponatremia: stable at 133, corrected from 122 with HD. 3. Hypokalemia: added K bath to HD 4. Anemia: stable, pending iron panel/ferritin,  5. CKD-MBD: PTH 257, Ca 6.9 would correct to 8.5 based on albumin from 10/14, no phos? Will order renal func panel for tomorrow. 6. [facility]. AVF/AVG deferred because of cellulitis. 7. HTN: Stable 8. Cellulitis: on abx (vanc/zosyn) per primary 9. Severe protein calorie malnutrition  Tawni CarnesWight, Andrew  Renal Attending As above.  Pt evaluated  and I agree with above. Ajayla Iglesias C

## 2014-03-01 NOTE — Progress Notes (Signed)
OT cancellation   03/01/14 1536  OT Visit Information  Last OT Received On 03/01/14  Reason Eval/Treat Not Completed Patient at procedure or test/ unavailable. Pt at HD.    Jenell MillinerLindsey Caitlinn Klinker, OTR/L [phone]

## 2014-03-01 NOTE — Progress Notes (Signed)
VASCULAR SURGERY:  Her tunneled dialysis catheter is working well. She is currently not a candidate for long-term access given her cellulitis in her lower extremities and multiple wounds in her upper extremities. She can be reevaluated in the future when her wounds have healed.  Sierra Ferrarihristopher Tyan Dy, MD, FACS Beeper [phone] 03/01/2014

## 2014-03-01 NOTE — Consult Note (Signed)
WOC wound consult note  Consult requested for leg wounds.  WOC consult performed recently; refer to 10/15 progress notes for assessment and plan of care.  Topical treatment orders have been provided.  Please re-consult if further assistance is needed.  Thank-you,  Cammie Mcgeeawn Senita Corredor MSN, RN, CWOCN, [place] LineWCN-AP, CNS [phone]

## 2014-03-01 NOTE — Consult Note (Signed)
Physical Medicine and Rehabilitation Consult Reason for Consult: Debilitation/SIRS Referring Physician: [facility]   HPI: [name] is a 78 y.o. right-handed female with history of coronary artery disease status post CABG, chronic renal insufficiency with baseline creatinine 3.83, diabetes mellitus with peripheral neuropathy, PVD with multiple revascularization procedures. Patient with recent admit 02/16/2014 to 02/20/2014 for acute on chronic renal failure and cellulitis and was discharged home with family using a walker and home health therapies. Presented 02/24/2014 with worsening pedal edema and persistent cellulitis changes. Denied any chest pain or shortness of breath. Noted admission creatinine of 4.80 and hyponatremia 122. Chest x-ray showed no signs of failure. Renal ultrasound bilateral atrophic kidneys with bilateral nephrolithiasis. No hydronephrosis noted. Elevated WBC of 13,000 increased to 16,000. Placed on broad-spectrum antibiotics suspect SIRS/sepsis. Blood cultures negative. Subcutaneous heparin added for DVT prophylaxis. Patient currently remains on vancomycin as well as doxycycline. Underwent ultrasound-guided placement of right IJ dietetic catheter per vascular surgery and followup renal services with hemodialysis initiated and plan long-term dialysis. Followup wound care nurse for chronic leg ulcers and skin care as advised as well as previous followup for partial thickness skin loss to buttocks. Patient with bouts of diarrhea and rectal tube in place. Physical therapy evaluation completed 02/26/2014 with recommendations for physical medicine rehabilitation consult.  Review of Systems  Constitutional: Positive for malaise/fatigue.  Cardiovascular: Positive for palpitations and leg swelling.  Gastrointestinal: Positive for diarrhea.  Musculoskeletal: Positive for falls and myalgias.  Neurological: Positive for weakness.  All other systems reviewed and are  negative.  Past Medical History  Diagnosis Date  . Paroxysmal atrial fibrillation   . PVD (peripheral vascular disease)     status post fem-fem bypass in sept/2009  . Junctional bradycardia     in the setting of hyperkalemia in the past  . GI bleeding   . Coronary artery disease   . Mitral regurgitation     status post annuloplasty ring.  . Pulmonary hypertension     moderate  . Hypertension   . Diabetes mellitus   . Renal insufficiency     in feb. 2008, acute on chronic  . Dysrhythmia    Past Surgical History  Procedure Laterality Date  . Coronary artery bypass graft      LIMA to the LAD, SVG to PDA, SVG to posterolateral, SVG to diagonal 2000  . Cholecystectomy    . Amputation      partial of right thumb   Family History  Problem Relation Age of Onset  . Cancer Mother     deceased  . Heart attack Father     deceased   Social History:  reports that she quit smoking about 15 years ago. Her smoking use included Cigarettes. She smoked 0.00 packs per day. She does not have any smokeless tobacco history on file. She reports that she does not drink alcohol or use illicit drugs. Allergies:  Allergies  Allergen Reactions  . Sulfonamide Derivatives Itching  . Codeine Rash   Medications Prior to Admission  Medication Sig Dispense Refill  . amLODipine (NORVASC) 10 MG tablet Take 10 mg by mouth every morning.      Marland Kitchen. doxycycline (VIBRA-TABS) 100 MG tablet Take 1 tablet (100 mg total) by mouth every 12 (twelve) hours.  28 tablet  0  . furosemide (LASIX) 80 MG tablet Take 80-160 mg by mouth 2 (two) times daily as needed for fluid (Takes 1 tablet daily, but takes a second dose later in the  day if needed.).       Marland Kitchen glipiZIDE (GLUCOTROL XL) 10 MG 24 hr tablet Take 10 mg by mouth 2 (two) times daily.      . mometasone (ELOCON) 0.1 % cream Apply 1 application topically 2 (two) times daily.         Home: Home Living Family/patient expects to be discharged to:: Private  residence Living Arrangements: Alone Available Help at Discharge: Family;Available PRN/intermittently Type of Home: Mobile home Home Access: Stairs to enter Entrance Stairs-Number of Steps: 4 Entrance Stairs-Rails: Right;Left;Can reach both Home Layout: One level Home Equipment: Walker - 2 wheels;Bedside commode;Walker - 4 wheels Additional Comments: Per pt report she only sponge bathes now because she is scared to get into the shower for fear of falling.  Per brother and family they are having a ramp built now.   Functional History: Prior Function Level of Independence: Independent with assistive device(s) Comments: Per pt she uses her RW.  She was active, but has not been home long enough to recieve HHPT/OT/RN and an aide.  Functional Status:  Mobility: Bed Mobility Overal bed mobility: Needs Assistance Bed Mobility: Supine to Sit Supine to sit: Mod assist General bed mobility comments: Mod assist to support trunk to get to sitting EOB.  Pt able to progress both legs over the side, but needs assist even with bed rail to get trunk to upright sitting.  Transfers Overall transfer level: Needs assistance Equipment used: Rolling walker (2 wheeled) Transfers: Sit to/from [employer] Sit to Stand: +2 physical assistance;Mod assist Stand pivot transfers: +2 physical assistance;Mod assist;Max assist General transfer comment: Two person mod assist to get pt to standing and pt immdiately hyperextended her knees and leaned her lower legs against the bed for stability.  As we pivoted with RW pt was mod initially and then became max assist to safely get to chair as her legs buckeled and she crashed down into the recliner.  Ambulation/Gait General Gait Details: unable at this time she is too weak.     ADL:    Cognition: Cognition Overall Cognitive Status: Within Functional Limits for tasks assessed (not specifically tested, but history was accurate per family) Orientation  Level: Oriented X4 Cognition Arousal/Alertness: Awake/alert Behavior During Therapy: WFL for tasks assessed/performed Overall Cognitive Status: Within Functional Limits for tasks assessed (not specifically tested, but history was accurate per family)  Blood pressure 100/79, pulse 64, temperature 97.7 F (36.5 C), temperature source Oral, resp. rate 16, height 5' 4.96" (1.65 m), weight 79 kg (174 lb 2.6 oz), SpO2 99.00%. Physical Exam  Vitals reviewed. Constitutional: She is oriented to person, place, and time.  HENT:  Head: Normocephalic.  Eyes: EOM are normal.  Neck: Normal range of motion. Neck supple. No thyromegaly present.  Cardiovascular: Normal rate and regular rhythm.   Respiratory: Effort normal and breath sounds normal. No respiratory distress.  GI: Soft. Bowel sounds are normal. She exhibits no distension.  Genitourinary:  Rectal tube in place  Neurological: She is alert and oriented to person, place, and time.  Follows simple commands. Alert and appropriate. UE: 3/5 deltoid, bicep, tricep and 4- wrist and HI.  LE: 2/5 HF, 2+ KE, 3/5 ankles. More limited on right due to pain. ?mild distal sensory loss.  Skin: Skin is warm and dry.  Numerous bruises and ecchymoses and skin tears on all 4 limbs. Right ankle wrapped.     Results for orders placed during the hospital encounter of 02/24/14 (from the past 24 hour(s))  GLUCOSE, CAPILLARY     Status: Abnormal   Collection Time    02/28/14 12:01 PM      Result Value Ref Range   Glucose-Capillary 111 (*) 70 - 99 mg/dL  GLUCOSE, CAPILLARY     Status: Abnormal   Collection Time    02/28/14  5:29 PM      Result Value Ref Range   Glucose-Capillary 143 (*) 70 - 99 mg/dL  GLUCOSE, CAPILLARY     Status: Abnormal   Collection Time    02/28/14  9:05 PM      Result Value Ref Range   Glucose-Capillary 133 (*) 70 - 99 mg/dL   Comment 1 Notify RN    BASIC METABOLIC PANEL     Status: Abnormal   Collection Time    03/01/14  7:30 AM       Result Value Ref Range   Sodium 133 (*) 137 - 147 mEq/L   Potassium 3.8  3.7 - 5.3 mEq/L   Chloride 96  96 - 112 mEq/L   CO2 19  19 - 32 mEq/L   Glucose, Bld 49 (*) 70 - 99 mg/dL   BUN 61 (*) 6 - 23 mg/dL   Creatinine, Ser 1.61 (*) 0.50 - 1.10 mg/dL   Calcium 6.9 (*) 8.4 - 10.5 mg/dL   GFR calc non Af Amer 14 (*) >90 mL/min   GFR calc Af Amer 17 (*) >90 mL/min   Anion gap 18 (*) 5 - 15  CBC     Status: Abnormal   Collection Time    03/01/14  7:30 AM      Result Value Ref Range   WBC 16.0 (*) 4.0 - 10.5 K/uL   RBC 3.76 (*) 3.87 - 5.11 MIL/uL   Hemoglobin 10.2 (*) 12.0 - 15.0 g/dL   HCT 09.6 (*) 04.5 - 40.9 %   MCV 78.2  78.0 - 100.0 fL   MCH 27.1  26.0 - 34.0 pg   MCHC 34.7  30.0 - 36.0 g/dL   RDW 81.1  91.4 - 78.2 %   Platelets 153  150 - 400 K/uL  GLUCOSE, CAPILLARY     Status: Abnormal   Collection Time    03/01/14  7:39 AM      Result Value Ref Range   Glucose-Capillary 43 (*) 70 - 99 mg/dL   Comment 1 Notify RN    GLUCOSE, CAPILLARY     Status: None   Collection Time    03/01/14  8:22 AM      Result Value Ref Range   Glucose-Capillary 99  70 - 99 mg/dL   No results found.  Assessment/Plan: Diagnosis: critical illness myopathy 1. Does the need for close, 24 hr/day medical supervision in concert with the patient's rehab needs make it unreasonable for this patient to be served in a less intensive setting? Yes 2. Co-Morbidities requiring supervision/potential complications: dm2, ckd, cad, afib 3. Due to bladder management, bowel management, safety, skin/wound care, disease management, medication administration, pain management and patient education, does the patient require 24 hr/day rehab nursing? Yes 4. Does the patient require coordinated care of a physician, rehab nurse, PT (1-2 hrs/day, 5 days/week) and OT (1-2 hrs/day, 5 days/week) to address physical and functional deficits in the context of the above medical diagnosis(es)? Yes Addressing deficits in the  following areas: balance, endurance, locomotion, strength, transferring, bowel/bladder control, bathing, dressing, feeding, grooming, toileting and psychosocial support 5. Can the patient actively participate in an intensive therapy program of  at least 3 hrs of therapy per day at least 5 days per week? Yes 6. The potential for patient to make measurable gains while on inpatient rehab is excellent 7. Anticipated functional outcomes upon discharge from inpatient rehab are supervision and min assist  with PT, supervision and min assist with OT, n/a with SLP. 8. Estimated rehab length of stay to reach the above functional goals is: 14-18 days 9. Does the patient have adequate social supports to accommodate these discharge functional goals? Yes 10. Anticipated D/C setting: Home 11. Anticipated post D/C treatments: HH therapy and Outpatient therapy 12. Overall Rehab/Functional Prognosis: excellent  RECOMMENDATIONS: This patient's condition is appropriate for continued rehabilitative care in the following setting: CIR Patient has agreed to participate in recommended program. Yes Note that insurance prior authorization may be required for reimbursement for recommended care.  Comment: Rehab Admissions Coordinator to follow up.  Thanks,  Ranelle OysterZachary T. Swartz, MD, [place] [facility]     03/01/2014

## 2014-03-01 NOTE — Progress Notes (Signed)
[facility] PROGRESS NOTE  BRIONY PARVEEN NTI:[id number] DOB: 1936-03-03 DOA: 02/24/2014 PCP: Sherrie Mustache, MD  Assessment/Plan: 78 y.o. female with PMH of CAD s/p CABG, S/p MVR, DM, PAF, PAD, CKD 4-5, Gout, Recent cellulitis, recently discharged from the hospital on 10/10 after being treated for cellulitis and ARF, comes in for worsening pedal edema, and persistent cellulitis changes, and diarrhea, CHF, ESRD  1. Chronic cellulitis, dermatitis, skin ulcers due to chronic leg edema, PAD; sirs/sepsis on admisison -improved on atx; blood cultures NGTD; afebrile;  -leg wound look better; d/c IV atx, changed to PO doxy (10/19); cont local wound care   -Pt completed IV atx on the last admission as well;   2. ESRD, Hyponatremia, AG met acidosis likely due CKD; US renal: Atrophic kidneys  -10/16: s/p right IJ Diatek HD cath; started HD had 3 dialysis  -appreciate vascular surgery, nephrology input;  3. CAD s/p CABG, patient denies any chest pain; cont monitor; added ASA 4. PAF, patient declined anticoagulation; HR stable; not on BB; started ASA 5. Acute on chronic CHF (systolic HF); echo: LVEF 86-76%; clinically  -clinically improving' cont HD for volume control  6. DM, hypoglycemia likely due to PO glipizide; d/c glipizide; resume temporary on d10 drip; monitor  7. Diarrhea probably atx associated, d/c atx;  c diff negative;    Code Status: full Family Communication: d/w patient, called family Ac[place], [place] Other [phone] no answer; tried [phone] provided by patient no answer; try later   (indicate person spoken with, relationship, and if by phone, the number) Disposition Plan: pend clinical improvement; CIR   Consultants:  Nephrology   Procedures:  none  Antibiotics:  Zosyn 10/14<<  vanc 10/14<<  Metronidazole 10/15<<<   (indicate start date, and stop date if known)  HPI/Subjective: alert  Objective: Filed Vitals:   03/01/14 0456  BP: 100/79  Pulse:  64  Temp: 97.7 F (36.5 C)  Resp: 16    Intake/Output Summary (Last 24 hours) at 03/01/14 0950 Last data filed at 02/28/14 1836  Gross per 24 hour  Intake   1197 ml  Output      0 ml  Net   1197 ml   Filed Weights   02/27/14 0057 02/27/14 1442 02/27/14 1727  Weight: 81.7 kg (180 lb 1.9 oz) 79 kg (174 lb 2.6 oz) 79 kg (174 lb 2.6 oz)    Exam:   General:  alert  Cardiovascular: s1,s2 +JVD  Respiratory: few crackles   Abdomen: soft, nt,dn   Musculoskeletal: no chronic dermatitis, edema, ulcers    Data Reviewed: Basic Metabolic Panel:  Recent Labs Lab 02/25/14 0443 02/26/14 0600 02/27/14 0338 02/28/14 0416 03/01/14 0730  NA 126* 122* 130* 134* 133*  K 4.3 4.3 3.3* 3.6* 3.8  CL 90* 86* 92* 96 96  CO2 14* 13* _0 GLUCOSE 56* 286* 32* 98 49*  BUN 123* 122* 81* 56* 61*  CREATININE 4.33* 4.41* 3.19* 2.56* 2.96*  CALCIUM 7.1* 6.6* 7.0* 6.9* 6.9*   Liver Function Tests:  Recent Labs Lab 02/24/14 1105  AST 48*  ALT 45*  ALKPHOS 190*  BILITOT 0.7  PROT 5.3*  ALBUMIN 2.0*   No results found for this basename: LIPASE, AMYLASE,  in the last 168 hours No results found for this basename: AMMONIA,  in the last 168 hours CBC:  Recent Labs Lab 02/24/14 1105 02/24/14 1123 02/25/14 0443 02/27/14 0338 03/01/14 0730  WBC 10.3  --  13.0* 15.2* 16.0*  NEUTROABS 9.6*  --   --   --   --  HGB 11.6* 12.9 10.4* 10.6* 10.2*  HCT 32.8* 38.0 29.5* 29.8* 29.4*  MCV 77.2*  --  77.0* 74.3* 78.2  PLT 242  --  226 227 153   Cardiac Enzymes:  Recent Labs Lab 02/24/14 1105  TROPONINI <0.30   BNP (last 3 results) No results found for this basename: PROBNP,  in the last 8760 hours CBG:  Recent Labs Lab 02/28/14 1201 02/28/14 1729 02/28/14 2105 03/01/14 0739 03/01/14 0822  GLUCAP 111* 143* 133* 43* 99    Recent Results (from the past 240 hour(s))  URINE CULTURE     Status: None   Collection Time    02/24/14 10:51 AM      Result Value Ref Range Status    Specimen Description URINE, CATHETERIZED   Final   Special Requests NONE   Final   Culture  Setup Time     Final   Value: 02/24/2014 12:18     Performed at [place]     Final   Value: NO GROWTH     Performed at [facility]   Culture     Final   Value: NO GROWTH     Performed at [facility]   Report Status 02/25/2014 FINAL   Final  CULTURE, BLOOD (ROUTINE X 2)     Status: None   Collection Time    02/24/14 11:05 AM      Result Value Ref Range Status   Specimen Description BLOOD RIGHT ARM   Final   Special Requests BOTTLES DRAWN AEROBIC AND ANAEROBIC 10ML   Final   Culture  Setup Time     Final   Value: 02/24/2014 17:27     Performed at [facility]   Culture     Final   Value:        BLOOD CULTURE RECEIVED NO GROWTH TO DATE CULTURE WILL BE HELD FOR 5 DAYS BEFORE ISSUING A FINAL NEGATIVE REPORT     Performed at [facility]   Report Status PENDING   Incomplete  CULTURE, BLOOD (ROUTINE X 2)     Status: None   Collection Time    02/24/14 11:45 AM      Result Value Ref Range Status   Specimen Description BLOOD LEFT ARM   Final   Special Requests BOTTLES DRAWN AEROBIC AND ANAEROBIC 10ML   Final   Culture  Setup Time     Final   Value: 02/24/2014 17:29     Performed at [facility]   Culture     Final   Value:        BLOOD CULTURE RECEIVED NO GROWTH TO DATE CULTURE WILL BE HELD FOR 5 DAYS BEFORE ISSUING A FINAL NEGATIVE REPORT     Performed at [facility]   Report Status PENDING   Incomplete  CLOSTRIDIUM DIFFICILE BY PCR     Status: None   Collection Time    02/25/14  4:56 AM      Result Value Ref Range Status   C difficile by pcr NEGATIVE  NEGATIVE Final     Studies: No results found.  Scheduled Meds: . aspirin EC  81 mg Oral Daily  . collagenase   Topical Daily  . feeding supplement (PRO-STAT SUGAR FREE 64)  30 mL Oral TID WC  . heparin subcutaneous  5,000 Units Subcutaneous 3 times per day   . insulin aspart  0-9 Units Subcutaneous TID WC  . piperacillin-tazobactam (ZOSYN)  IV  2.25 g Intravenous 3 times per day   Continuous Infusions: . sodium chloride 10 mL/hr at 02/26/14 1340  . dextrose 30 mL/hr at 02/27/14 1906    Active Problems:   MITRAL REGURGITATION   Essential hypertension   Acute on chronic renal failure   DM type 2 causing renal disease   CKD (chronic kidney disease) stage 4, GFR 15-29 ml/min   SIRS (systemic inflammatory response syndrome)   Hypothermia    Time spent: >35 minutes     Kinnie Feil  [facility] Pager [phone]. If 7PM-7AM, please contact night-coverage at [email or web address], password [facility] 03/01/2014, 9:50 AM  LOS: 5 days

## 2014-03-01 NOTE — Procedures (Signed)
Stable hemodynamics.  Pt desires treatment as outpt in [place] and staff working on this. [name] C

## 2014-03-01 NOTE — Progress Notes (Signed)
Physical Therapy Treatment Patient Details Name: [name] MRN: [id number] DOB: 30-Jul-1935 Today's Date: 03/01/2014    History of Present Illness 78 y.o. female [facility] with worsening pedal edema, and worsening cellulitic changes in bil leg.  Pt dx with SIRS due to chronic cellulits, dermatitis and skin ulcers.  Also dx with CKD stage 5 (s/p AVG graft placement in R IJ on 02/26/14), hyponatremia, and metabolic acidosis.  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       PT Comments    Pt eager to work with therapy, however limited by incontinence (rectal tube leaking and bladder). Reviewed leg exercises she can do on her own and pt demonstrated good technique.   Follow Up Recommendations  CIR     Equipment Recommendations  Wheelchair (measurements PT);Wheelchair cushion (measurements PT)    Recommendations for Other Services Rehab consult     Precautions / Restrictions Precautions Precautions: Fall Precaution Comments: h/o falls Other Brace/Splint: per pt she has been wearing a "boot" on her right ankle due to a "chipped ankle bone", but did not bring it with her to the hospita; Restrictions RLE Weight Bearing: Weight bearing as tolerated    Mobility  Bed Mobility Overal bed mobility: Needs Assistance Bed Mobility: Rolling;Sidelying to Sit;Sit to Sidelying Rolling: Mod assist Sidelying to sit: Mod assist;+2 for physical assistance;HOB elevated     Sit to sidelying: Min assist;+2 for safety/equipment General bed mobility comments: Pt attempting to raise herself supine to sit with inability to raise her torso due to weakness; educated on rolling to side for side to sit; requires vc and facilitation  Transfers Overall transfer level: Needs  assistance Equipment used: Rolling walker (2 wheeled) Transfers: Sit to/from Stand Sit to Stand: Mod assist;Max assist;+2 physical assistance Stand pivot transfers: +2 physical assistance;Mod assist;Max assist       General transfer comment: Pt fearful of falling and reassured her we would start by standing at bedside. Initially pt with incr hip flexion, knee hyperextension, and posterior calves braced against EOB without achieving full upright. On second attempt, pt cued to bring her nose forward over the front of RW and pt able to achieve full upright with hips extended and knees in neutral. Stood x 2 minutes  Ambulation/Gait             General Gait Details: unable at this time she is too weak.    Stairs            Wheelchair Mobility    Modified Rankin (Stroke Patients Only)       Balance     Sitting balance-Leahy Scale: Fair       Standing balance-Leahy Scale: Poor                      Cognition Arousal/Alertness: Awake/alert Behavior During Therapy: WFL for tasks assessed/performed Overall Cognitive Status: Within Functional Limits for tasks assessed (not specifically tested, but history was accurate per family)                      Exercises General Exercises - Lower Extremity Ankle Circles/Pumps: AROM;Left;10 reps (Pt reports painful on Rt) Quad Sets: AROM;Both;5 reps Heel Slides: AROM;Right;5 reps Hip ABduction/ADduction: AROM;Both;5 reps    General Comments General  comments (skin integrity, edema, etc.): Pt's rectal tube leaking down her legs and onto floor, pt also incontinent of bladder      Pertinent Vitals/Pain Pain Assessment: Faces Faces Pain Scale: Hurts a little bit Pain Location: Rt ankle Pain Intervention(s): Limited activity within patient's tolerance;Monitored during session;Repositioned    Home Living                      Prior Function            PT Goals (current goals can now be found in the  care plan section) Acute Rehab PT Goals Patient Stated Goal: Pt wants to go home Progress towards PT goals: Progressing toward goals    Frequency  Min 3X/week    PT Plan Current plan remains appropriate    Co-evaluation             End of Session Equipment Utilized During Treatment: Gait belt Activity Tolerance: Patient limited by fatigue Patient left: with call bell/phone within reach;in bed     Time: 1142-1206 PT Time Calculation (min): 24 min  Charges:  $Therapeutic Activity: 23-37 mins                    G Codes:      Orazio Weller 03/01/2014, 12:25 PM Pager [phone]

## 2014-03-01 NOTE — Progress Notes (Addendum)
Rehab Admissions Coordinator Note:  Patient was screened by Trish MageLogue, Eugenia M for appropriateness for an Inpatient Acute Rehab Consult.  At this time, we are recommending Inpatient Rehab consult.  Lelon FrohlichLogue, Eugenia M 03/01/2014, 10:06 AM  I can be reached at [phone].Adult (Non-Pregnant) Hypoglycemia Protocol Treatment Guidelines  1.  RN shall initiate Hypoglycemia Protocol emergency measures immediately when:            w Routine or STAT CBG and/or a lab glucose indicates hypoglycemia (CBG < 70 mg/dl)  2.  Treat the patient according to ability to take PO's and severity of hypoglycemia.   3.  If patient is on GlucoStabilizer, follow directions provided by the [facility] for hypoglycemic events.  4.  If patient on insulin pump, follow Hypoglycemia Protocol.  If patient requires more than one treatment have patient place pump in SUSPEND and notify MD.  DO NOT leave pump in SUSPEND for greater than 30 minutes unless ordered by MD.  A.  Treatment for Mild or Moderate-Patient cooperative and able to swallow    1.  Patient taking PO's and can cooperate   a.  Give one of the following 15 gram CHO options:                           w 1 tube oral dextrose gel                           w 3-4 Glucose tablets                           w 4 oz. Juice                           w 4 oz. regular soda                                    ESRD patients:  clear, regular soda                           w 8 oz. skim milk    b.  Recheck CBG in 15 minutes after treatment                            w If CBG < 70 mg/dl, repeat treatment and recheck until hypoglycemia is resolved                            w If CBG > 70 mg/dl and next meal is more than 1 hour away, give additional 15 grams CHO   2.  Patient NPO-Patient cooperative and no altered mental status    a.  Give 25 ml of D50 IV.   b.  Recheck CBG in 15 minutes after treatment.                             w If CBG is less than 70 mg/dl, repeat  treatment and recheck until hypoglycemia is resolved.   c.  Notify MD for further orders.             SPECIAL CONSIDERATIONS:  a.  If no IV access,                              w Start IV of D5W at [facility]                             w Give 25 ml of D50 IV.    b.  If unable to gain IV access                             w Give Glucagon IM:    i.  1 mg if patient weighs more than 45.5 kg    ii.  0.5 mg if patient weighs less than 45.5 kg   c.  Notify MD for further orders  B.  Treatment for Severe-- Patient unconscious or unable to take PO's safely    1.  Position patient on side   2.  Give 50 ml D50 IV   3.  Recheck CBG in 15 minutes.                    w If CBG is less than 70 mg/dl, repeat treatment and recheck until hypoglycemia is resolved.   4.  Notify MD for further orders.    SPECIAL CONSIDERATIONS:    a.  If no IV access                              w Give Glucagon IM                                        i.  1 mg if patient weighs more than 45.5 kg                                       ii.  0.5 mg if patient weighs less than 45.5 kg                              w Start IV of D5W at 50 ml/hr and give 50 ml D50 IV   b.  If no IV access and active seizure                               w Call Rapid Response   c.  If unable to gain IV access, give Glucagon IM:                              w 1 mg if patient weighs more than 45.5 kg                              w 0.5 mg if patient weighs less than 45.5 kg   d.  Notify MD for further orders.  C.  Complete smart text progress note to document intervention and follow-up CBG   1.  In [facility] patient chart, click on  Notes (left side of screen)   2.  Create Progress Note   3.  Click on [employer] Smart Text.  In the Match box type "hypo" and enter    4.  Double click on CHL IP HYPOGLYCEMIC EVENT and enter data   5.  MD must be notified if patient is NPO or experienced severe hypoglycemia Hypoglycemic Event  CBG:  43  Treatment: 15 GM carbohydrate snack  Symptoms: None  Follow-up CBG: Time:0820 CBG Result:99  Possible Reasons for Event: Inadequate meal intake  Comments/MD notified:Dr Jackquline DenmarkBuriev    Dixie Coppa, Peng-Sian  Remember to initiate Hypoglycemia Order Set & complete

## 2014-03-01 NOTE — Progress Notes (Signed)
03/01/2014 4:27 PM Hemodialysis Outpatient Note; the CLIP process has been initiated on this patient. Information has been forwarded to the [facility] intake coordinator. I have asked the physician to order a PPD to be placed [facility], they will not accept a patient without it. I have also left a message for the social worker Herbert SetaHeather pertaining to the patient needing  transportation to and from the center, I have not heard back from LortonHeather. I will update when I have further information. Thank you. Tilman NeatKrzywonos, Chirstopher Iovino H

## 2014-03-01 NOTE — Progress Notes (Addendum)
VASCULAR LAB PRELIMINARY  PRELIMINARY  PRELIMINARY  PRELIMINARY  Right  Upper Extremity Vein Map    Cephalic  Segment Diameter Depth Comment  1. Axilla 3.24 mm mm   2. Mid upper arm 3.74 mm mm   3. Above AC  mm mm   4. In AC  6.07 mm mm   5. Below AC 2.69 mm mm   6. Mid forearm 2.89 mm mm   7. Wrist 1.84 mm mm    mm mm    mm mm    mm mm    Basilic  Segment Diameter Depth Comment  1. Axilla mm mm   2. Mid upper arm 4.48 mm 20.2 mm   3. Above AC 1.94 mm 8 mm   4. In AC mm mm   5. Below AC mm mm   6. Mid forearm mm mm   7. Wrist mm mm    mm mm    mm mm    mm mm    Left Upper Extremity Vein Map    Cephalic  Segment Diameter Depth Comment  1. Axilla 2.78 mm mm   2. Mid upper arm 3.74 mm mm   3. Above AC mm mm   4. In AC  mm mm   5. Below AC 2.7 mm mm   6. Mid forearm  1.21 mm mm   7. Wrist mm mm    mm mm    mm mm    mm mm    Basilic  Segment Diameter Depth Comment  1. Axilla mm mm   2. Mid upper arm mm mm   3. Above AC mm mm   4. In AC mm mm   5. Below AC mm mm   6. Mid forearm mm mm   7. Wrist mm mm    mm mm    mm mm    mm mm     CESTONE, HELENE, RVT 03/01/2014, 10:28 AM   *PRELIMINARY RESULTS* Vascular Ultrasound Bilateral Upper Extremity Arterial Doppler has been completed.   Bilateral upper extremity arteries are patent with biphasic flow. Unable to evaluate the pressure of the left forearm due to IV placement. The right ulnar artery pressure is significantly lower than the brachial artery pressure. There is a significant difference between brachial artery pressures, however this may be due to technical error.   03/01/2014 5:36 PM Gertie FeyMichelle Armen Waring, RVT, RDCS, RDMS

## 2014-03-01 NOTE — Progress Notes (Signed)
Rehab Admissions Coordinator Note:  Patient was screened by Jadrien Narine M for apTrish Magepropriateness for an Inpatient Acute Rehab Consult.  At this time, we are recommending Inpatient Rehab consult.  Trish MageLogue, Clovia Reine M 03/01/2014, 9:46 AM  I can be reached at [phone].

## 2014-03-02 ENCOUNTER — Encounter (HOSPITAL_COMMUNITY): Payer: Self-pay | Admitting: Vascular Surgery

## 2014-03-02 DIAGNOSIS — E162 Hypoglycemia, unspecified: Secondary | ICD-10-CM

## 2014-03-02 LAB — CULTURE, BLOOD (ROUTINE X 2)
CULTURE: NO GROWTH
Culture: NO GROWTH

## 2014-03-02 LAB — GLUCOSE, CAPILLARY
GLUCOSE-CAPILLARY: 167 mg/dL — AB (ref 70–99)
GLUCOSE-CAPILLARY: 130 mg/dL — AB (ref 70–99)
Glucose-Capillary: 108 mg/dL — ABNORMAL HIGH (ref 70–99)
Glucose-Capillary: 141 mg/dL — ABNORMAL HIGH (ref 70–99)

## 2014-03-02 NOTE — Clinical Social Work Note (Deleted)
Documented in wrong patient chart.

## 2014-03-02 NOTE — Progress Notes (Signed)
Patient ID: [name], female    DOB: Jan 29, 1936, 78 y.o.   MRN: [id number]  S: no complaints today.  O:BP 126/51  Pulse 69  Temp(Src) 97.6 F (36.4 C) (Oral)  Resp 17  Ht 5' 4.96" (1.65 m)  Wt 171 lb 8.3 oz (77.8 kg)  BMI 28.58 kg/m2  SpO2 96%  Intake/Output Summary (Last 24 hours) at 03/02/14 0818 Last data filed at 03/02/14 0400  Gross per 24 hour  Intake    870 ml  Output   2000 ml  Net  -1130 ml   Intake/Output: I/O last 3 completed shifts: In: 870 [P.O.:240; I.V.:630] Out: 2000 [Other:2000]  Intake/Output this shift:    Weight change:  Gen: NAD CVS: RRR, no m/r/g. PC right chest Resp: CTAB Abd: soft, +BS Ext: trace edema, bandage right ankle. Skin: LE cellulitis   Recent Labs Lab 02/24/14 1105 02/24/14 1123 02/25/14 0443 02/26/14 0600 02/27/14 0338 02/28/14 0416 03/01/14 0730 03/01/14 1504  NA 126* 122* 126* 122* 130* 134* 133* 132*  K 4.3 4.1 4.3 4.3 3.3* 3.6* 3.8 3.5*  CL 87* 93* 90* 86* 92* 96 96 94*  CO2 15*  --  14* 13* 19 21 19 21   GLUCOSE 94 95 56* 286* 32* 98 49* 104*  BUN 126* 128* 123* 122* 81* 56* 61* 64*  CREATININE 4.35* 4.80* 4.33* 4.41* 3.19* 2.56* 2.96* 3.08*  ALBUMIN 2.0*  --   --   --   --   --   --  1.6*  CALCIUM 7.4*  --  7.1* 6.6* 7.0* 6.9* 6.9* 7.0*  PHOS  --   --   --   --   --   --   --  4.4  AST 48*  --   --   --   --   --   --   --   ALT 45*  --   --   --   --   --   --   --    Liver Function Tests:  Recent Labs Lab 02/24/14 1105 03/01/14 1504  AST 48*  --   ALT 45*  --   ALKPHOS 190*  --   BILITOT 0.7  --   PROT 5.3*  --   ALBUMIN 2.0* 1.6*   No results found for this basename: LIPASE, AMYLASE,  in the last 168 hours No results found for this basename: AMMONIA,  in the last 168 hours CBC:  Recent Labs Lab 02/24/14 1105  02/25/14 0443 02/27/14 0338 03/01/14 0730 03/01/14 1504  WBC 10.3  --  13.0* 15.2* 16.0* 16.9*  NEUTROABS 9.6*  --   --   --   --   --   HGB 11.6*  < > 10.4* 10.6* 10.2* 9.7*  HCT  32.8*  < > 29.5* 29.8* 29.4* 27.9*  MCV 77.2*  --  77.0* 74.3* 78.2 79.0  PLT 242  --  226 227 153 153  < > = values in this interval not displayed. Cardiac Enzymes:  Recent Labs Lab 02/24/14 1105  TROPONINI <0.30   CBG:  Recent Labs Lab 03/01/14 0739 03/01/14 0822 03/01/14 1138 03/01/14 1855 03/01/14 2214  GLUCAP 43* 99 112* 102* 108*    Iron Studies:   Recent Labs  03/01/14 1504  IRON 42  TIBC 140*  FERRITIN 341*   Studies/Results: No results found. Marland Kitchen. aspirin EC  81 mg Oral Daily  . collagenase   Topical Daily  . doxycycline  100 mg Oral Q12H  .  feeding supplement (PRO-STAT SUGAR FREE 64)  30 mL Oral TID WC  . heparin subcutaneous  5,000 Units Subcutaneous 3 times per day  . insulin aspart  0-9 Units Subcutaneous TID WC    BMET    Component Value Date/Time   NA 132* 03/01/2014 1504   K 3.5* 03/01/2014 1504   CL 94* 03/01/2014 1504   CO2 21 03/01/2014 1504   GLUCOSE 104* 03/01/2014 1504   BUN 64* 03/01/2014 1504   CREATININE 3.08* 03/01/2014 1504   CALCIUM 7.0* 03/01/2014 1504   GFRNONAA 14* 03/01/2014 1504   GFRAA 16* 03/01/2014 1504   CBC    Component Value Date/Time   WBC 16.9* 03/01/2014 1504   RBC 3.53* 03/01/2014 1504   HGB 9.7* 03/01/2014 1504   HCT 27.9* 03/01/2014 1504   PLT 153 03/01/2014 1504   MCV 79.0 03/01/2014 1504   MCH 27.5 03/01/2014 1504   MCHC 34.8 03/01/2014 1504   RDW 15.1 03/01/2014 1504   LYMPHSABS 0.4* 02/24/2014 1105   MONOABS 0.3 02/24/2014 1105   EOSABS 0.0 02/24/2014 1105   BASOSABS 0.0 02/24/2014 1105     Assessment/Plan:  1. ESRD: on HD with [facility], 3 sessions so far. Plan for HD tomorrow. 2. Hyponatremia: improved on HD. 3. Hypokalemia: added K bath to HD 4. Anemia: stable, likely needs epo as outpt. 5. 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  [facility]/AVG deferred because of cellulitis. 7. HTN: Stable 8. Cellulitis: on abx (vanc/zosyn) per primary 9. Severe protein  calorie malnutrition  Tawni CarnesWight, Andrew  Renal Attending:  She continues to improve with dialysis.  She reminds us of her desire [place].  This is being worked on.  Agreee with note of Dr. Delford FieldWright with modifications as needed Elnathan Fulford C

## 2014-03-02 NOTE — Progress Notes (Signed)
Rehab admissions - I met with pt in follow up to rehab MD consult and explained the possibility of inpatient rehab. I shared that rehab MD anticipated pt would need to be at CIR for 14-18 days and pt immediately responded, "Oh no, I will only stay 3-4 days. I want to go home." I explained that rehab MD made his length of stay recommendations based on her current functional level. I also explained that pt's do not typically come for 3-4 day intervals but follow the rehab MD recommended stay. Pt states she has lots of family support and wants to get therapy in home. I told pt we will follow her progress with therapies and take it one day at a time.  I spoke with Heather, case manager about pt's case and the concerns I had with pt wanting to go home. Heather then followed up with pt. Pt states she has good family support and that family can transport her to dialysis.  I spoke with Dr. Buriev about pt's case and MD stated pt is not medically ready for CIR yet. He also shared that pursuing inpatient rehab or SNF would be most appropriate as pt is a new dialysis pt.  I noted that pt is needing mod to max assist with limited transfers and has not ambulated yet with PT. Pt was asking to get out of bed during my discussion with her and I alerted the RN of her request.  We will follow pt's case and see how she progresses with therapies. Please call me with any questions. Thanks.  Janine Turnington, PT Rehabilitation Admissions Coordinator [phone]    

## 2014-03-02 NOTE — Progress Notes (Signed)
[facility] PROGRESS NOTE  [name] VOH:[id number] DOB: Jun 18, 1935 DOA: 02/24/2014 PCP: Sherrie Mustache, MD  Assessment/Plan: 78 y.o. female with PMH of CAD s/p CABG, S/p MVR, DM, PAF, PAD, CKD 4-5, Gout, Recent cellulitis(Rx with IV atx), recently discharged from the hospital on 10/10 after being treated for cellulitis and ARF, comes in for worsening pedal edema, and persistent cellulitis changes, and diarrhea, CHF, ESRD  1. Chronic cellulitis, dermatitis, skin ulcers due to chronic leg edema, PAD; sirs/sepsis on admisison -improved on atx; blood cultures NGTD; afebrile;  -leg wound look better; but ulcers, d/c IV atx changed to PO doxy (10/19); cont local wound care   -Pt completed IV atx on the last admission as well;   2. ESRD, Hyponatremia, AG met acidosis likely due CKD; US renal: Atrophic kidneys  -10/16: s/p right IJ Diatek HD cath; started HD had 3 dialysis  -appreciate vascular surgery, nephrology input; per vascular Pt is not a candidate for permanent access at this time  3. CAD s/p CABG, patient denies any chest pain; cont monitor; added ASA 4. PAF, patient declined anticoagulation; HR stable; not on BB; started ASA 5. Acute on chronic CHF (systolic HF); echo: LVEF 69-48%; clinically  -clinically improving' cont HD for volume control  6. DM, hypoglycemia likely due to PO glipizide; d/c glipizide; resume temporary on d10 drip; plan d/c d10 on 10/21 if stable;  monitor  7. Diarrhea probably atx associated, d/c atx;  c diff negative; d/c flexseal (10/20)   Code Status: full Family Communication: d/w patient, again alled family Henny, Strauch Other [phone] no answer; tried [id number] provided by patient no answer; try later; Pt says that family is up to date with the management plan   (indicate person spoken with, relationship, and if by phone, the number) Disposition Plan: pend clinical improvement; CIR    Consultants:  Nephrology    Procedures:  none  Antibiotics:  Zosyn 10/14<<  vanc 10/14<<  Metronidazole 10/15<<<   (indicate start date, and stop date if known)  HPI/Subjective: alert  Objective: Filed Vitals:   03/02/14 0620  BP: 126/51  Pulse: 69  Temp: 97.6 F (36.4 C)  Resp: 17    Intake/Output Summary (Last 24 hours) at 03/02/14 1054 Last data filed at 03/02/14 0952  Gross per 24 hour  Intake    870 ml  Output   2000 ml  Net  -1130 ml   Filed Weights   02/27/14 1727 03/01/14 1516 03/01/14 1821  Weight: 79 kg (174 lb 2.6 oz) 79.5 kg (175 lb 4.3 oz) 77.8 kg (171 lb 8.3 oz)    Exam:   General:  alert  Cardiovascular: s1,s2 +JVD  Respiratory: few crackles   Abdomen: soft, nt,dn   Musculoskeletal: no chronic dermatitis, edema, ulcers    Data Reviewed: Basic Metabolic Panel:  Recent Labs Lab 02/26/14 0600 02/27/14 0338 02/28/14 0416 03/01/14 0730 03/01/14 1504  NA 122* 130* 134* 133* 132*  K 4.3 3.3* 3.6* 3.8 3.5*  CL 86* 92* 96 96 94*  CO2 13* _0 GLUCOSE 286* 32* 98 49* 104*  BUN 122* 81* 56* 61* 64*  CREATININE 4.41* 3.19* 2.56* 2.96* 3.08*  CALCIUM 6.6* 7.0* 6.9* 6.9* 7.0*  PHOS  --   --   --   --  4.4   Liver Function Tests:  Recent Labs Lab 02/24/14 1105 03/01/14 1504  AST 48*  --   ALT 45*  --   ALKPHOS 190*  --   BILITOT 0.7  --  PROT 5.3*  --   ALBUMIN 2.0* 1.6*   No results found for this basename: LIPASE, AMYLASE,  in the last 168 hours No results found for this basename: AMMONIA,  in the last 168 hours CBC:  Recent Labs Lab 02/24/14 1105 02/24/14 1123 02/25/14 0443 02/27/14 0338 03/01/14 0730 03/01/14 1504  WBC 10.3  --  13.0* 15.2* 16.0* 16.9*  NEUTROABS 9.6*  --   --   --   --   --   HGB 11.6* 12.9 10.4* 10.6* 10.2* 9.7*  HCT 32.8* 38.0 29.5* 29.8* 29.4* 27.9*  MCV 77.2*  --  77.0* 74.3* 78.2 79.0  PLT 242  --  226 227 153 153   Cardiac Enzymes:  Recent Labs Lab 02/24/14 1105  TROPONINI <0.30   BNP (last 3  results) No results found for this basename: PROBNP,  in the last 8760 hours CBG:  Recent Labs Lab 03/01/14 0822 03/01/14 1138 03/01/14 1855 03/01/14 2214 03/02/14 0804  GLUCAP 99 112* 102* 108* 108*    Recent Results (from the past 240 hour(s))  URINE CULTURE     Status: None   Collection Time    02/24/14 10:51 AM      Result Value Ref Range Status   Specimen Description URINE, CATHETERIZED   Final   Special Requests NONE   Final   Culture  Setup Time     Final   Value: 02/24/2014 12:18     Performed at [facility]     Final   Value: NO GROWTH     Performed at [facility]   Culture     Final   Value: NO GROWTH     Performed at [facility]   Report Status 02/25/2014 FINAL   Final  CULTURE, BLOOD (ROUTINE X 2)     Status: None   Collection Time    02/24/14 11:05 AM      Result Value Ref Range Status   Specimen Description BLOOD RIGHT ARM   Final   Special Requests BOTTLES DRAWN AEROBIC AND ANAEROBIC 10ML   Final   Culture  Setup Time     Final   Value: 02/24/2014 17:27     Performed at [facility]   Culture     Final   Value: NO GROWTH 5 DAYS     Performed at [facility]   Report Status 03/02/2014 FINAL   Final  CULTURE, BLOOD (ROUTINE X 2)     Status: None   Collection Time    02/24/14 11:45 AM      Result Value Ref Range Status   Specimen Description BLOOD LEFT ARM   Final   Special Requests BOTTLES DRAWN AEROBIC AND ANAEROBIC 10ML   Final   Culture  Setup Time     Final   Value: 02/24/2014 17:29     Performed at [facility]   Culture     Final   Value: NO GROWTH 5 DAYS     Performed at [facility]   Report Status 03/02/2014 FINAL   Final  CLOSTRIDIUM DIFFICILE BY PCR     Status: None   Collection Time    02/25/14  4:56 AM      Result Value Ref Range Status   C difficile by pcr NEGATIVE  NEGATIVE Final     Studies: No results found.  Scheduled Meds: . aspirin EC  81 mg  Oral Daily  . collagenase  Topical Daily  . doxycycline  100 mg Oral Q12H  . feeding supplement (PRO-STAT SUGAR FREE 64)  30 mL Oral TID WC  . heparin subcutaneous  5,000 Units Subcutaneous 3 times per day  . insulin aspart  0-9 Units Subcutaneous TID WC   Continuous Infusions: . sodium chloride 10 mL/hr at 02/26/14 1340  . dextrose 30 mL/hr at 02/27/14 1906    Active Problems:   MITRAL REGURGITATION   Essential hypertension   Acute on chronic renal failure   DM type 2 causing renal disease   CKD (chronic kidney disease) stage 4, GFR 15-29 ml/min   SIRS (systemic inflammatory response syndrome)   Hypothermia    Time spent: >35 minutes     Kinnie Feil  [facility] Pager [phone]. If 7PM-7AM, please contact night-coverage at [email or web address], password [facility] 03/02/2014, 10:54 AM  LOS: 6 days

## 2014-03-03 LAB — BASIC METABOLIC PANEL
Anion gap: 14 (ref 5–15)
BUN: 45 mg/dL — AB (ref 6–23)
CO2: 24 mEq/L (ref 19–32)
Calcium: 7.2 mg/dL — ABNORMAL LOW (ref 8.4–10.5)
Chloride: 97 mEq/L (ref 96–112)
Creatinine, Ser: 2.52 mg/dL — ABNORMAL HIGH (ref 0.50–1.10)
GFR calc Af Amer: 20 mL/min — ABNORMAL LOW (ref >90)
GFR calc non Af Amer: 17 mL/min — ABNORMAL LOW (ref >90)
Glucose, Bld: 99 mg/dL (ref 70–99)
Potassium: 3.4 mEq/L — ABNORMAL LOW (ref 3.7–5.3)
Sodium: 135 mEq/L — ABNORMAL LOW (ref 137–147)

## 2014-03-03 LAB — CBC
HEMATOCRIT: 28.9 % — AB (ref 36.0–46.0)
HEMOGLOBIN: 9.8 g/dL — AB (ref 12.0–15.0)
MCH: 27.1 pg (ref 26.0–34.0)
MCHC: 33.9 g/dL (ref 30.0–36.0)
MCV: 79.8 fL (ref 78.0–100.0)
Platelets: 147 10*3/uL — ABNORMAL LOW (ref 150–400)
RBC: 3.62 MIL/uL — ABNORMAL LOW (ref 3.87–5.11)
RDW: 15.5 % (ref 11.5–15.5)
WBC: 13.5 10*3/uL — ABNORMAL HIGH (ref 4.0–10.5)

## 2014-03-03 LAB — QUANTIFERON TB GOLD ASSAY (BLOOD)
INTERFERON GAMMA RELEASE ASSAY: NEGATIVE
MITOGEN VALUE: 1.26 [IU]/mL
Quantiferon Nil Value: 0.02 IU/mL
TB AG VALUE: 0.02 [IU]/mL
TB Antigen Minus Nil Value: 0 IU/mL

## 2014-03-03 LAB — GLUCOSE, CAPILLARY
GLUCOSE-CAPILLARY: 171 mg/dL — AB (ref 70–99)
Glucose-Capillary: 101 mg/dL — ABNORMAL HIGH (ref 70–99)
Glucose-Capillary: 95 mg/dL (ref 70–99)
Glucose-Capillary: 128 mg/dL — ABNORMAL HIGH (ref 70–99)

## 2014-03-03 NOTE — Clinical Social Work Placement (Signed)
Clinical Social Work Department CLINICAL SOCIAL WORK PLACEMENT NOTE 03/03/2014  Patient:  [name],[name]  Account Number:  [id number] Admit date:  02/24/2014  Clinical Social Worker:  Merlyn LotJENNA HOLOMAN, CLINICAL SOCIAL WORKER  Date/time:  03/03/2014 03:18 PM  Clinical Social Work is seeking post-discharge placement for this patient at the following level of care:   SKILLED NURSING   (*CSW will update this form in Epic as items are completed)   03/03/2014  Patient/family provided with [facility] Department of Clinical Social Work's list of facilities offering this level of care within the geographic area requested by the patient (or if unable, by the patient's family).  03/03/2014  Patient/family informed of their freedom to choose among providers that offer the needed level of care, that participate in Medicare, Medicaid or managed care program needed by the patient, have an available bed and are willing to accept the patient.  03/03/2014  Patient/family informed of [facility]' ownership interest in [facility], as well as of the fact that they are under no obligation to receive care at this facility.  PASARR submitted to EDS on 03/03/2014 PASARR number received on 03/03/2014  FL2 transmitted to all facilities in geographic area requested by pt/family on  03/03/2014 FL2 transmitted to all facilities within larger geographic area on   Patient informed that his/her managed care company has contracts with or will negotiate with  certain facilities, including the following:     Patient/family informed of bed offers received:   Patient chooses bed at  Physician recommends and patient chooses bed at    Patient to be transferred to  on   Patient to be transferred to facility by  Patient and family notified of transfer on  Name of family member notified:    The following physician request were entered in Epic:   Additional Comments:   Merlyn LotJenna Holoman, [facility] Clinical Social  Worker [phone]

## 2014-03-03 NOTE — Progress Notes (Signed)
Physical Therapy Treatment Patient Details Name: Sierra PanderLorine E Stoke MRN: [id number] DOB: 01-10-1936 Today's Date: 03/03/2014    History of Present Illness 78 y.o. female [facility]14 with worsening pedal edema, and worsening cellulitic changes in bil leg.  Pt dx with SIRS due to chronic cellulits, dermatitis and skin ulcers.  Also dx with CKD stage 5 (s/p AVG graft placement in R IJ on 02/26/14), hyponatremia, and metabolic acidosis.  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       PT Comments    Pt progressing very slowly, fatigues with sitting there ex edge of bed and cannot maintain standing long enough today to ambulate safely. Worked on bed mobility and transfers. Pt also continues to be incontinent of bowel and bladder which is a challenge to mobility out of bed. Discharge plan updated to reflect SNF at d/c as per CSW in chart. PT will continue to follow.   Follow Up Recommendations  SNF;Supervision/Assistance - 24 hour     Equipment Recommendations  Wheelchair (measurements PT);Wheelchair cushion (measurements PT)    Recommendations for Other Services       Precautions / Restrictions Precautions Precautions: Fall Precaution Comments: h/o falls Required Braces or Orthoses: Other Brace/Splint Other Brace/Splint: brace on right ankle Restrictions Weight Bearing Restrictions: Yes RLE Weight Bearing: Weight bearing as tolerated    Mobility  Bed Mobility Overal bed mobility: Needs Assistance Bed Mobility: Sidelying to Sit;Rolling;Sit to Supine Rolling: Supervision Sidelying to sit: Mod assist   Sit to supine: +2 for physical assistance;Mod assist   General bed mobility comments: pt rolling well for bowel/ bladder clean up, can achieve full SL  position without physical assist, with rail. Mod A needed to elevate trunk for SL to sit as well as to assist legs off bed. +2 assist for return to supine, to get legs in bed and scoot to head  Transfers Overall transfer level: Needs assistance Equipment used: None Transfers: Sit to/from Stand Sit to Stand: Max assist         General transfer comment: Max A for sit to stand x2 with PT standing in front of therapist. Pt incontinent of bowel and bladder second time and had to return to bed for further clean up as could not maintain standing past 2 mins.   Ambulation/Gait             General Gait Details: NT due to inability to maintain standing and need for clean up   Stairs            Wheelchair Mobility    Modified Rankin (Stroke Patients Only)       Balance Overall balance assessment: Needs assistance Sitting-balance support: Single extremity supported;Feet supported Sitting balance-Leahy Scale: Fair     Standing balance support: Bilateral upper extremity supported;During functional activity Standing balance-Leahy Scale: Zero                      Cognition Arousal/Alertness: Awake/alert Behavior During Therapy: WFL for tasks assessed/performed Overall Cognitive Status: Within Functional Limits for tasks assessed                      Exercises General Exercises - Lower Extremity Ankle Circles/Pumps: AROM;Both;10 reps;Supine Long Arc Quad: Both;Supine;AROM;10 reps Heel Slides: AROM;Both;10 reps;Supine  Straight Leg Raises: Strengthening;Both;10 reps;Supine    General Comments General comments (skin integrity, edema, etc.): skin weeping from bilateral arms, RN present and changing dressings      Pertinent Vitals/Pain Pain Assessment: No/denies pain    Home Living                      Prior Function            PT Goals (current goals can now be found in the care plan section) Acute Rehab PT Goals Patient Stated Goal: not  stated PT Goal Formulation: With patient/family Time For Goal Achievement: 03/12/14 Potential to Achieve Goals: Fair Progress towards PT goals: Progressing toward goals    Frequency  Min 2X/week    PT Plan Discharge plan needs to be updated    Co-evaluation             End of Session Equipment Utilized During Treatment: Gait belt Activity Tolerance: Patient limited by fatigue Patient left: in bed;with call bell/phone within reach;with nursing/sitter in room     Time: [id number] PT Time Calculation (min): 30 min  Charges:  $Therapeutic Activity: 23-37 mins                    G Codes:     Lyanne CoVictoria Marcia Lepera, PT  Acute Rehab Services  [phone]  Lyanne CoManess, Kaylem Gidney 03/03/2014, 4:54 PM

## 2014-03-03 NOTE — Evaluation (Signed)
Occupational Therapy Evaluation Patient Details Name: [name] MRN: [id number] DOB: 1935/08/04 Today's Date: 03/03/2014    History of Present Illness 78 y.o. female [facility] with worsening pedal edema, and worsening cellulitic changes in bil leg.  Pt dx with SIRS due to chronic cellulits, dermatitis and skin ulcers.  Also dx with CKD stage 5 (s/p AVG graft placement in R IJ on 02/26/14), hyponatremia, and metabolic acidosis.  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      Clinical Impression   Pt admitted with above. Pt independent with ADLs, PTA. Feel pt will benefit from acute OT to increase strength and independence prior to d/c. Recommending CIR for rehab.    Follow Up Recommendations  CIR;Supervision/Assistance - 24 hour    Equipment Recommendations  Other (comment) (defer to next venue)    Recommendations for Other Services Rehab consult     Precautions / Restrictions Precautions Precautions: Fall Precaution Comments: h/o falls Other Brace/Splint: brace on right ankle Restrictions Weight Bearing Restrictions: Yes RLE Weight Bearing: Weight bearing as tolerated      Mobility Bed Mobility Overal bed mobility: Needs Assistance Bed Mobility: Sit to Supine;Rolling Rolling: Mod assist     Sit to supine: Max assist   General bed mobility comments: Assist with LE's when going to supine position. Assist to scoot to [facility] and [place] position used. Rolled to perform hygiene.   Transfers Overall transfer level: Needs assistance Equipment used: 2 person hand held assist Transfers: Sit to/from Stand Sit to Stand: +2 physical assistance;Max assist Stand pivot transfers: +2 physical assistance;Max assist       General transfer  comment: cues for hand placement. Walker initially in front and then moved and performed with 2 person assist to pivot to bed.     Balance                                            ADL Overall ADL's : Needs assistance/impaired     Grooming: Bed level;Set up           Upper Body Dressing : Set up;Bed level   Lower Body Dressing: Maximal assistance;Bed level   Toilet Transfer: +2 for physical assistance;Maximal assistance;Stand-pivot (from chair to bed)   Toileting- Clothing Manipulation and Hygiene: Moderate assistance;Bed level       Functional mobility during ADLs: +2 for physical assistance;Maximal assistance (stand pivot) General ADL Comments: Nursing asked OT to assist with getting pt back to bed. Pt stood with +2 assist and 3rd person assisted with hygiene. Pt rolled in bed and OT had her clean more thoroughly and explained that rolling or leaning is safer for LB ADLs versus standing now. discussed d/c options briefly.     Vision                     Perception     Praxis      Pertinent Vitals/Pain Pain Assessment: No apparent distress-no pain reported.     Hand Dominance Right   Extremity/Trunk Assessment Upper Extremity Assessment Upper Extremity Assessment: Generalized weakness   Lower Extremity Assessment Lower Extremity Assessment: Defer to PT evaluation  Communication Communication Communication: No difficulties   Cognition Arousal/Alertness: Awake/alert Behavior During Therapy: WFL for tasks assessed/performed Overall Cognitive Status: Within Functional Limits for tasks assessed                     General Comments       Exercises Exercises: Other exercises Other Exercises Other Exercises: instructed pt to be moving arms (will try to give theraband next session) and to be pumping ankles.    Shoulder Instructions      Home Living Family/patient expects to be discharged to:: Private  residence Living Arrangements: Alone Available Help at Discharge: Family;Available PRN/intermittently Type of Home: Mobile home Home Access: Stairs to enter Entrance Stairs-Number of Steps: 4 Entrance Stairs-Rails: Right;Left;Can reach both Home Layout: One level     Bathroom Shower/Tub: Walk-in shower (pt only sponge bathes)         Home Equipment: Walker - 2 wheels;Bedside commode;Walker - 4 wheels   Additional Comments: Per pt report she only sponge bathes now because she is scared to get into the shower for fear of falling.  Per brother and family they are having a ramp built now.       Prior Functioning/Environment Level of Independence: Independent with assistive device(s)        Comments: Per pt she uses her RW.  She was active, but has not been home long enough to recieve HHPT/OT/RN and an aide.     OT Diagnosis: Acute pain;Generalized weakness   OT Problem List: Decreased strength;Decreased activity tolerance;Decreased range of motion;Impaired balance (sitting and/or standing);Decreased knowledge of use of DME or AE;Decreased knowledge of precautions;Pain   OT Treatment/Interventions: Self-care/ADL training;DME and/or AE instruction;Therapeutic activities;Patient/family education;Balance training;Therapeutic exercise    OT Goals(Current goals can be found in the care plan section) Acute Rehab OT Goals Patient Stated Goal: not stated OT Goal Formulation: With patient Time For Goal Achievement: 03/10/14 Potential to Achieve Goals: Good ADL Goals Pt Will Perform Lower Body Bathing: with min assist;sitting/lateral leans;bed level Pt Will Perform Lower Body Dressing: sitting/lateral leans;with min assist;bed level Pt Will Transfer to Toilet: stand pivot transfer;with min assist;bedside commode Pt Will Perform Toileting - Clothing Manipulation and hygiene: with set-up;sitting/lateral leans;bed level Additional ADL Goal #1: Pt will independently perform HEP for UE's to  increase strength.  OT Frequency: Min 2X/week   Barriers to D/C:            Co-evaluation              End of Session Equipment Utilized During Treatment: Gait belt;Rolling walker Nurse Communication: Other (comment) (nurse assisted)  Activity Tolerance: Patient tolerated treatment well Patient left: in bed;with call bell/phone within reach;with bed alarm set   Time: [id number] OT Time Calculation (min): 12 min Charges:  OT General Charges $OT Visit: 1 Procedure OT Evaluation $Initial OT Evaluation Tier I: 1 Procedure G-CodesEarlie Raveling:    Marlyne Totaro L OTR/L [phone] 03/03/2014, 11:47 AM

## 2014-03-03 NOTE — Progress Notes (Signed)
Inpatient Rehabilitation  I spoke with [name] at the bedside and she is now indicating that she prefers to go to a nursing home rehab nearer to her home.  She mentions that she prefers [facility].  I made sure she understands that this is a skilled nursing facility/nursing home.  She states she is aware and wants to go in that direction so it would be easier on her family.  I have updated [name] SW .  I will sign off.    Weldon PickingSusan Tallyn Holroyd PT Inpatient Rehab Admissions Coordinator Cell [phone] Office [phone]]

## 2014-03-03 NOTE — Clinical Social Work Psychosocial (Addendum)
Clinical Social Work Department BRIEF PSYCHOSOCIAL ASSESSMENT 03/03/2014  Patient:  [name],[name]     Account Number:  [id number]     Admit date:  02/24/2014  Clinical Social Worker:  Merlyn LotHOLOMAN,Tamea Bai, CLINICAL SOCIAL WORKER  Date/Time:  03/03/2014 03:14 PM  Referred by:  Physician  Date Referred:  03/03/2014 Referred for  SNF Placement   Other Referral:   Interview type:  Patient Other interview type:    PSYCHOSOCIAL DATA Living Status:  ALONE Admitted from facility:   Level of care:   Primary support name:  Bruce DonathSylvia Roupp Primary support relationship to patient:  CHILD, ADULT Degree of support available:   Patient reports that [place] help [place] at [place].    CURRENT CONCERNS Current Concerns  Post-Acute Placement   Other Concerns:    SOCIAL WORK ASSESSMENT / PLAN CSW spoke to patient concerning SNF placement.  Patient was not agreeable to SNF at first.  After patient talked with CIR patient changed her disposition towards SNF and decided she wanted [place]  Now [place]. CSW will continue to follow.   Assessment/plan status:  Psychosocial Support/Ongoing Assessment of Needs Other assessment/ plan:   FL2  PASAR   Information[place]referral to [place] resources[place]    PATIENT'S/FAMILY'S RESPONSE TO PLAN OF CARE: Patient is agreeable to SNF placement as long as [place].       Merlyn LotJenna Holoman, LCSWA Clinical Social Worker [phone]

## 2014-03-03 NOTE — Progress Notes (Signed)
03/03/2014 9:35 AM Hemodialysis Outpatient Note; this [facility] on a Tuesday, Thursday and Saturday 2nd shift schedule. Her chair time will be 11:30 however for her first day at the center they will need patient to arrive at 10:00 to sign paperwork and consents. Please advise patient to have a family member with her if she is incapable of signing paperwork. The center will need patient to have on her person the following; all current medications,insurance card,drivers license and SS card. The center asks that patient begin treatments on a Tuesday or Thursday only. Thank you. Tilman NeatKrzywonos, Yaziel Brandon H

## 2014-03-03 NOTE — Progress Notes (Signed)
Patient ID: [name], female    DOB: February 14, 1936, 78 y.o.   MRN: [id number]  S: No complaints.  O:BP 107/46  Pulse 68  Temp(Src) 97.5 F (36.4 C) (Oral)  Resp 17  Ht 5' 4.96" (1.65 m)  Wt 171 lb 8.3 oz (77.8 kg)  BMI 28.58 kg/m2  SpO2 96%  Intake/Output Summary (Last 24 hours) at 03/03/14 0821 Last data filed at 03/03/14 0438  Gross per 24 hour  Intake    580 ml  Output      0 ml  Net    580 ml   Intake/Output: I/O last 3 completed shifts: In: 970 [P.O.:340; I.V.:630] Out: -   Intake/Output this shift:    Weight change:  Gen: NAD HEENT: left supraclavicular swelling, reducible CVS: RRR. PC right chest Resp: CTAB Abd: soft, +BS Ext: trace edema, bandage right ankle. Skin: LE cellulitis   Recent Labs Lab 02/24/14 1105  02/25/14 0443 02/26/14 0600 02/27/14 0338 02/28/14 0416 03/01/14 0730 03/01/14 1504 03/03/14 0320  NA 126*  < > 126* 122* 130* 134* 133* 132* 135*  K 4.3  < > 4.3 4.3 3.3* 3.6* 3.8 3.5* 3.4*  CL 87*  < > 90* 86* 92* 96 96 94* 97  CO2 15*  --  14* 13* 19 21 19 21 24   GLUCOSE 94  < > 56* 286* 32* 98 49* 104* 99  BUN 126*  < > 123* 122* 81* 56* 61* 64* 45*  CREATININE 4.35*  < > 4.33* 4.41* 3.19* 2.56* 2.96* 3.08* 2.52*  ALBUMIN 2.0*  --   --   --   --   --   --  1.6*  --   CALCIUM 7.4*  --  7.1* 6.6* 7.0* 6.9* 6.9* 7.0* 7.2*  PHOS  --   --   --   --   --   --   --  4.4  --   AST 48*  --   --   --   --   --   --   --   --   ALT 45*  --   --   --   --   --   --   --   --   < > = values in this interval not displayed. Liver Function Tests:  Recent Labs Lab 02/24/14 1105 03/01/14 1504  AST 48*  --   ALT 45*  --   ALKPHOS 190*  --   BILITOT 0.7  --   PROT 5.3*  --   ALBUMIN 2.0* 1.6*   No results found for this basename: LIPASE, AMYLASE,  in the last 168 hours No results found for this basename: AMMONIA,  in the last 168 hours CBC:  Recent Labs Lab 02/24/14 1105  02/25/14 0443 02/27/14 0338 03/01/14 0730 03/01/14 1504  03/03/14 0320  WBC 10.3  --  13.0* 15.2* 16.0* 16.9* 13.5*  NEUTROABS 9.6*  --   --   --   --   --   --   HGB 11.6*  < > 10.4* 10.6* 10.2* 9.7* 9.8*  HCT 32.8*  < > 29.5* 29.8* 29.4* 27.9* 28.9*  MCV 77.2*  --  77.0* 74.3* 78.2 79.0 79.8  PLT 242  --  226 227 153 153 147*  < > = values in this interval not displayed. Cardiac Enzymes:  Recent Labs Lab 02/24/14 1105  TROPONINI <0.30   CBG:  Recent Labs Lab 03/02/14 0804 03/02/14 1148 03/02/14 1706 03/02/14 2145 03/03/14  0803  GLUCAP 108* 167* 141* 130* 101*    Iron Studies:   Recent Labs  03/01/14 1504  IRON 42  TIBC 140*  FERRITIN 341*   Studies/Results: No results found. Marland Kitchen. aspirin EC  81 mg Oral Daily  . collagenase   Topical Daily  . doxycycline  100 mg Oral Q12H  . feeding supplement (PRO-STAT SUGAR FREE 64)  30 mL Oral TID WC  . heparin subcutaneous  5,000 Units Subcutaneous 3 times per day  . insulin aspart  0-9 Units Subcutaneous TID WC    BMET    Component Value Date/Time   NA 135* 03/03/2014 0320   K 3.4* 03/03/2014 0320   CL 97 03/03/2014 0320   CO2 24 03/03/2014 0320   GLUCOSE 99 03/03/2014 0320   BUN 45* 03/03/2014 0320   CREATININE 2.52* 03/03/2014 0320   CALCIUM 7.2* 03/03/2014 0320   GFRNONAA 17* 03/03/2014 0320   GFRAA 20* 03/03/2014 0320   CBC    Component Value Date/Time   WBC 13.5* 03/03/2014 0320   RBC 3.62* 03/03/2014 0320   HGB 9.8* 03/03/2014 0320   HCT 28.9* 03/03/2014 0320   PLT 147* 03/03/2014 0320   MCV 79.8 03/03/2014 0320   MCH 27.1 03/03/2014 0320   MCHC 33.9 03/03/2014 0320   RDW 15.5 03/03/2014 0320   LYMPHSABS 0.4* 02/24/2014 1105   MONOABS 0.3 02/24/2014 1105   EOSABS 0.0 02/24/2014 1105   BASOSABS 0.0 02/24/2014 1105     Assessment/Plan: 78 y.o. female with CKD, DM, PAF, PVD; admitted 10/14 for recurrent cellulitis and ARF. She is now ESRD and requiring HD.  1. ESRD: on HD with TDC, 3 sessions so far. Outpt HD setup with [facility], plan for HD  tomorrow. 2. Hyponatremia: improved on HD. 3. Hypokalemia: continue K bath with HD 4. Anemia: stable, likely needs epo as outpt. 5. 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  6. [facility] AVF/AVG deferred because of cellulitis. 7. HTN: Stable 8. Cellulitis: on abx (vanc/zosyn) per primary 9. Severe protein calorie malnutrition  Tawni CarnesWight, Andrew  Renal Attending: Apparently unable to go home due to mobility issues.  We will plan HD in AM and OP dialysis [place]. Ismeal Heider C

## 2014-03-03 NOTE — Progress Notes (Signed)
[facility] PROGRESS NOTE  [name] ZOX:[id number] DOB: 05/16/1935 DOA: 02/24/2014 PCP: Sherrie Mustache, MD   Assessment/Plan:  78 y.o. female with PMH of CAD s/p CABG, S/p MVR, DM, PAF, PAD, CKD 4-5, Gout, Recent cellulitis(Rx with IV atx), recently discharged from the hospital on 10/10 after being treated for cellulitis and ARF, comes in for worsening pedal edema, and persistent cellulitis changes, and diarrhea, CHF, ESRD.    1. Chronic cellulitis, dermatitis, skin ulcers due to chronic leg edema, PAD; sirs/sepsis on admisison -improved on atx; blood cultures NGTD; afebrile;  -leg wounds look better; but ulcers, d/c IV atx changed to PO doxy (10/19); cont local wound care   -Pt completed IV atx on the last admission as well;      2. ESRD, Hyponatremia, AG met acidosis likely due CKD; US renal: Atrophic kidneys  -10/16: s/p right IJ Diatek HD cath; started HD had 3 dialysis  -appreciate vascular surgery, nephrology input; per vascular Pt is not a candidate for permanent access at this time  - We await the CLIP process to finish for outpatient dialysis    3. CAD s/p CABG, patient denies any chest pain; cont monitor; added ASA     4. PAF, patient declined anticoagulation; HR stable; not on BB; started ASA    5. Acute on chronic CHF (systolic HF); echo: LVEF 81-19%; clinically improving' cont HD for volume control      6. DM, hypoglycemia likely due to PO glipizide; d/c glipizide; monitor off of the 10 drip  CBG (last 3)   Recent Labs  03/02/14 1706 03/02/14 2145 03/03/14 0803  GLUCAP 141* 130* 101*      7. Diarrhea probably atx associated, d/c atx;  c diff negative; d/c flexseal (10/20)      Code Status: full  Family Communication: d/w patient, again alled family [name], [name] Other [phone] no answer; tried [phone] provided by patient no answer; try later; Pt says that family is up to date with the management plan   (indicate person  spoken with, relationship, and if by phone, the number)  Disposition Plan: pend clinical improvement; CIR     Consultants:  Nephrology , Vascular Surg  Procedures:  none  Antibiotics:  Zosyn 10/14<<  vanc 10/14<<  Metronidazole 10/15<<<   (indicate start date, and stop date if known)    HPI/Subjective:  Awake in bed, no headache, no chest - abd pain, no SOB.   Objective: Filed Vitals:   03/03/14 0443  BP: 107/46  Pulse: 68  Temp: 97.5 F (36.4 C)  Resp: 17    Intake/Output Summary (Last 24 hours) at 03/03/14 1244 Last data filed at 03/03/14 0925  Gross per 24 hour  Intake    700 ml  Output      0 ml  Net    700 ml   Filed Weights   02/27/14 1727 03/01/14 1516 03/01/14 1821  Weight: 79 kg (174 lb 2.6 oz) 79.5 kg (175 lb 4.3 oz) 77.8 kg (171 lb 8.3 oz)    Exam:   General:  alert  Cardiovascular: s1,s2 +JVD  Respiratory: few crackles   Abdomen: soft, nt,dn   Musculoskeletal: no chronic dermatitis, edema, ulcers     Data Reviewed: Basic Metabolic Panel:   Recent Labs Lab 02/27/14 0338 02/28/14 0416 03/01/14 0730 03/01/14 1504 03/03/14 0320  NA 130* 134* 133* 132* 135*  K 3.3* 3.6* 3.8 3.5* 3.4*  CL 92* 96 96 94* 97  CO2 '19 21 19 21 ' 24  GLUCOSE 32* 98 49* 104* 99  BUN 81* 56* 61* 64* 45*  CREATININE 3.19* 2.56* 2.96* 3.08* 2.52*  CALCIUM 7.0* 6.9* 6.9* 7.0* 7.2*  PHOS  --   --   --  4.4  --    Liver Function Tests:  Recent Labs Lab 03/01/14 1504  ALBUMIN 1.6*   No results found for this basename: LIPASE, AMYLASE,  in the last 168 hours No results found for this basename: AMMONIA,  in the last 168 hours CBC:  Recent Labs Lab 02/25/14 0443 02/27/14 0338 03/01/14 0730 03/01/14 1504 03/03/14 0320  WBC 13.0* 15.2* 16.0* 16.9* 13.5*  HGB 10.4* 10.6* 10.2* 9.7* 9.8*  HCT 29.5* 29.8* 29.4* 27.9* 28.9*  MCV 77.0* 74.3* 78.2 79.0 79.8  PLT 226 227 153 153 147*   Cardiac Enzymes: No results found for this basename:  CKTOTAL, CKMB, CKMBINDEX, TROPONINI,  in the last 168 hours BNP (last 3 results) No results found for this basename: PROBNP,  in the last 8760 hours CBG:  Recent Labs Lab 03/02/14 0804 03/02/14 1148 03/02/14 1706 03/02/14 2145 03/03/14 0803  GLUCAP 108* 167* 141* 130* 101*    Recent Results (from the past 240 hour(s))  URINE CULTURE     Status: None   Collection Time    02/24/14 10:51 AM      Result Value Ref Range Status   Specimen Description URINE, CATHETERIZED   Final   Special Requests NONE   Final   Culture  Setup Time     Final   Value: 02/24/2014 12:18     Performed at [place]     Final   Value: NO GROWTH     Performed at [place]     Final   Value: NO GROWTH     Performed at [facility]   Report Status 02/25/2014 FINAL   Final  CULTURE, BLOOD (ROUTINE X 2)     Status: None   Collection Time    02/24/14 11:05 AM      Result Value Ref Range Status   Specimen Description BLOOD RIGHT ARM   Final   Special Requests BOTTLES DRAWN AEROBIC AND ANAEROBIC 10ML   Final   Culture  Setup Time     Final   Value: 02/24/2014 17:27     Performed at [place]     Final   Value: NO GROWTH 5 DAYS     Performed at [facility]   Report Status 03/02/2014 FINAL   Final  CULTURE, BLOOD (ROUTINE X 2)     Status: None   Collection Time    02/24/14 11:45 AM      Result Value Ref Range Status   Specimen Description BLOOD LEFT ARM   Final   Special Requests BOTTLES DRAWN AEROBIC AND ANAEROBIC 10ML   Final   Culture  Setup Time     Final   Value: 02/24/2014 17:29     Performed at [place]     Final   Value: NO GROWTH 5 DAYS     Performed at [facility]   Report Status 03/02/2014 FINAL   Final  CLOSTRIDIUM DIFFICILE BY PCR     Status: None   Collection Time    02/25/14  4:56 AM      Result Value Ref Range Status   C difficile by pcr NEGATIVE  NEGATIVE Final  Studies: No results found.  Scheduled Meds: . aspirin EC  81 mg Oral Daily  . collagenase   Topical Daily  . doxycycline  100 mg Oral Q12H  . feeding supplement (PRO-STAT SUGAR FREE 64)  30 mL Oral TID WC  . heparin subcutaneous  5,000 Units Subcutaneous 3 times per day  . insulin aspart  0-9 Units Subcutaneous TID WC   Continuous Infusions: . sodium chloride 10 mL/hr at 02/26/14 1340  . dextrose 30 mL/hr at 03/02/14 1807    Active Problems:   MITRAL REGURGITATION   Essential hypertension   Acute on chronic renal failure   DM type 2 causing renal disease   CKD (chronic kidney disease) stage 4, GFR 15-29 ml/min   SIRS (systemic inflammatory response syndrome)   Hypothermia    Time spent:  35 minutes     [facility] Pager [phone]. If 7PM-7AM, please contact night-coverage at [email or web address], password [facility] 03/03/2014, 12:44 PM  LOS: 7 days

## 2014-03-03 NOTE — Progress Notes (Signed)
NUTRITION FOLLOW UP  Intervention:   -Continue with 30 ml Prostat TID -RD educated on basics of renal diet; recommend education with caregivers and close follow-up with RD at [facility] Center  Nutrition Dx:   Inadequate oral intake related to altered GI function as evidenced by chronic diarrhea, PO: 50%.   Goal:   Pt will meet >90% of estimated nutritional needs  Monitor:   PO/supplement intake, labs, weight changes, I/O's  Assessment:   78 y.o. female with PMH of CAD s/p CABG, ? S/p MVR, DM, PAF, PAD, CKD 4-5, Gout, Recent cellulitis, recently discharged from the hospital on 10/10 after being treated for cellulitis and ARF, comes in for worsening pedal edema, and persistent cellulitis changes, and diarrhea   Pt reports that she has been feeling better since initiation of HD andthat treatments are going well.  Pt reveals appetite is improving. She reveals she ate one pancake and 1/2 cup of coffee this morning (PO: 25%). Pt has been ordered Prostat TID; noted some refusals. Pt reports that she does not particularly care for the taste of the supplement. Offered other renal friendly supplement options (Resource [place]ze or Nepro), but pt would like to continue with Prostat.  Educated pt on importance of good PO intake for healing. Also reviewed basics of renal diet and discussed examples of high potassium, phosphorus, and high protein foods, as pt was questioning why she couldn't eat bananas. Also discussed good sources of protein and importance of increased protein consumption for HD and healing. Also encouraged pt to follow-up with questions with RD at HD Center.  Discharge disposition is CIR vs home. CSW and RNCM following.  Labs reviewed. Na improved (135). K: 3.5. Phos WDL. Glucose WDL. BUN/Creat and Calcium improving (42/2.52, 7.2).   Height: Ht Readings from Last 1 Encounters:  02/24/14 5' 4.96" (1.65 m)    Weight Status:   Wt Readings from Last 1 Encounters:  03/01/14 171 lb 8.3 oz (77.8  kg)  02/24/14  163 lb 5.8 oz (74.1 kg)   Re-estimated needs:  Kcal: 1800-2000 Protein: 78-88 grams Fluid: 1-1.5 L  Skin: rt and lt groin abrasion, rt arm skin tear, weeping edema on rt and lt legs  Diet Order: Carb Control   Intake/Output Summary (Last 24 hours) at 03/03/14 0923 Last data filed at 03/03/14 0438  Gross per 24 hour  Intake    580 ml  Output      0 ml  Net    580 ml    Last BM: 03/01/14   Labs:   Recent Labs Lab 03/01/14 0730 03/01/14 1504 03/03/14 0320  NA 133* 132* 135*  K 3.8 3.5* 3.4*  CL 96 94* 97  CO2 19 21 24   BUN 61* 64* 45*  CREATININE 2.96* 3.08* 2.52*  CALCIUM 6.9* 7.0* 7.2*  PHOS  --  4.4  --   GLUCOSE 49* 104* 99    CBG (last 3)   Recent Labs  03/02/14 1706 03/02/14 2145 03/03/14 0803  GLUCAP 141* 130* 101*    Scheduled Meds: . aspirin EC  81 mg Oral Daily  . collagenase   Topical Daily  . doxycycline  100 mg Oral Q12H  . feeding supplement (PRO-STAT SUGAR FREE 64)  30 mL Oral TID WC  . heparin subcutaneous  5,000 Units Subcutaneous 3 times per day  . insulin aspart  0-9 Units Subcutaneous TID WC    Continuous Infusions: . sodium chloride 10 mL/hr at 02/26/14 1340  . dextrose 30 mL/hr at  03/02/14 1807    Kendrick Haapala A. Mayford KnifeWilliams, RD, LDN Pager: [phone] After hours Pager: [phone]

## 2014-03-04 LAB — GLUCOSE, CAPILLARY
GLUCOSE-CAPILLARY: 120 mg/dL — AB (ref 70–99)
GLUCOSE-CAPILLARY: 92 mg/dL (ref 70–99)
Glucose-Capillary: 71 mg/dL (ref 70–99)

## 2014-03-04 MED ORDER — ALTEPLASE 2 MG IJ SOLR: 2.0000 mg | Freq: Once | INTRAMUSCULAR | Status: DC | PRN

## 2014-03-04 MED ORDER — OXYMETAZOLINE HCL 0.05 % NA SOLN: 1.0000 | Freq: Two times a day (BID) | NASAL | Status: AC

## 2014-03-04 MED ORDER — LOPERAMIDE HCL 2 MG PO CAPS: 2.0000 mg | ORAL_CAPSULE | ORAL | Status: AC | PRN

## 2014-03-04 MED ORDER — NEPRO/CARBSTEADY PO LIQD
237.0000 mL | ORAL | Status: DC | PRN
  Filled 2014-03-04: qty 237

## 2014-03-04 MED ORDER — LIDOCAINE-PRILOCAINE 2.5-2.5 % EX CREA
1.0000 "application " | TOPICAL_CREAM | CUTANEOUS | Status: DC | PRN
  Filled 2014-03-04: qty 5

## 2014-03-04 MED ORDER — SODIUM CHLORIDE 0.9 % IV SOLN: 100.0000 mL | INTRAVENOUS | Status: DC | PRN

## 2014-03-04 MED ORDER — LIDOCAINE HCL (PF) 1 % IJ SOLN: 5.0000 mL | INTRAMUSCULAR | Status: DC | PRN

## 2014-03-04 MED ORDER — HEPARIN SODIUM (PORCINE) 5000 UNIT/ML IJ SOLN: 5000.0000 [IU] | Freq: Three times a day (TID) | INTRAMUSCULAR | Status: DC

## 2014-03-04 MED ORDER — INSULIN ASPART 100 UNIT/ML FLEXPEN: PEN_INJECTOR | SUBCUTANEOUS | Status: AC

## 2014-03-04 MED ORDER — PRO-STAT SUGAR FREE PO LIQD: 30.0000 mL | Freq: Three times a day (TID) | ORAL | Status: AC

## 2014-03-04 MED ORDER — ASPIRIN EC 81 MG PO TBEC: 81.0000 mg | DELAYED_RELEASE_TABLET | Freq: Every day | ORAL | Status: AC

## 2014-03-04 MED ORDER — OXYMETAZOLINE HCL 0.05 % NA SOLN
1.0000 | Freq: Two times a day (BID) | NASAL | Status: DC
  Administered 2014-03-04: 1 via NASAL
  Filled 2014-03-04: qty 15

## 2014-03-04 MED ORDER — PENTAFLUOROPROP-TETRAFLUOROETH EX AERO: 1.0000 "application " | INHALATION_SPRAY | CUTANEOUS | Status: DC | PRN

## 2014-03-04 MED ORDER — HEPARIN SODIUM (PORCINE) 1000 UNIT/ML DIALYSIS
1000.0000 [IU] | INTRAMUSCULAR | Status: DC | PRN
  Filled 2014-03-04: qty 1

## 2014-03-04 MED ORDER — HEPARIN SODIUM (PORCINE) 1000 UNIT/ML DIALYSIS
20.0000 [IU]/kg | INTRAMUSCULAR | Status: DC | PRN
  Filled 2014-03-04: qty 2

## 2014-03-04 NOTE — Clinical Social Work Note (Addendum)
11:00 CSW called patients family to try and arrange family transportation per patients wishes.  CSW spoke with [name] [phone]) who does not feel safe transporting patient since she is unable to stand safely at this time- CSW to arrange [facility] transport.    CSW was then asked to speak with patients grandaughter, [name], who works [facility] in [place] and would like patient to go there.  CSW informed grandaughter that [name] not have beds available when CSW called this morning- [name] stated that she will attempt to get [facility] but if she is unable to patient can still discharge to [facility]  10:00 CSW spoke with patient at bedside, patient is agreeable to SNF [facility] in [place].   CSW will continue to follow.  Merlyn LotJenna Holoman, LCSWA Clinical Social Worker [phone]

## 2014-03-04 NOTE — Progress Notes (Signed)
Patient returned from dialysis. Alert and in stable condition.  

## 2014-03-04 NOTE — Discharge Instructions (Signed)
Follow with Primary MD Josue HectorNYLAND,LEONARD ROBERT, MD or SNF MD in 7 days   Get CBC, CMP, 2 view Chest X ray checked  by Primary MD next visit.    Activity: As tolerated with Full fall precautions use walker/cane & assistance as needed   Disposition SNF   Diet: Renal - Check your Weight same time everyday, if you gain over 2 pounds, or you develop in leg swelling, experience more shortness of breath or chest pain, call your Primary MD immediately. Follow Cardiac Low Salt Diet and 1.5 lit/day fluid restriction.   On your next visit with your primary care physician please Get Medicines reviewed and adjusted.   Please request your Prim.MD to go over all Hospital Tests and Procedure/Radiological results at the follow up, please get all Hospital records sent to your Prim MD by signing hospital release before you go home.   If you experience worsening of your admission symptoms, develop shortness of breath, life threatening emergency, suicidal or homicidal thoughts you must seek medical attention immediately by calling 911 or calling your MD immediately  if symptoms less severe.  You Must read complete instructions/literature along with all the possible adverse reactions/side effects for all the Medicines you take and that have been prescribed to you. Take any new Medicines after you have completely understood and accpet all the possible adverse reactions/side effects.   Do not drive, operating heavy machinery, perform activities at heights, swimming or participation in water activities or provide baby sitting services if your were admitted for syncope or siezures until you have seen by Primary MD or a Neurologist and advised to do so again.  Do not drive when taking Pain medications.    Do not take more than prescribed Pain, Sleep and Anxiety Medications  Special Instructions: If you have smoked or chewed Tobacco  in the last 2 yrs please stop smoking, stop any regular Alcohol  and or any  Recreational drug use.  Wear Seat belts while driving.   Please note  You were cared for by a hospitalist during your hospital stay. If you have any questions about your discharge medications or the care you received while you were in the hospital after you are discharged, you can call the unit and asked to speak with the hospitalist on call if the hospitalist that took care of you is not available. Once you are discharged, your primary care physician will handle any further medical issues. Please note that NO REFILLS for any discharge medications will be authorized once you are discharged, as it is imperative that you return to your primary care physician (or establish a relationship with a primary care physician if you do not have one) for your aftercare needs so that they can reassess your need for medications and monitor your lab values.

## 2014-03-04 NOTE — Progress Notes (Signed)
Patient is discharged from room 6N16 at this time. Transported by [facility] via stretcher with all belongings.

## 2014-03-04 NOTE — Progress Notes (Signed)
Pt. IV came out.  On call dr. said it was ok to not put another IV in her. Site bled for a long time.  Controlled the bleeding.

## 2014-03-04 NOTE — Discharge Summary (Signed)
[name], is a 78 y.o. female  DOB June 15, 1935  MRN [id number].  Admission date:  02/24/2014  Admitting Physician  Hosie Poisson, MD  Discharge Date:  03/04/2014   Primary MD  Sherrie Mustache, MD  Recommendations for primary care physician for things to follow:   Monitor skin breakdown closely, needs outpatient vascular surgery and renal followup closely.   Admission Diagnosis  Hyponatremia [E87.1] Acute on chronic kidney failure [N17.9, N18.9] SIRS (systemic inflammatory response syndrome) [A41.9]   Discharge Diagnosis  Hyponatremia [E87.1] Acute on chronic kidney failure [N17.9, N18.9] SIRS (systemic inflammatory response syndrome) [A41.9]    Active Problems:   MITRAL REGURGITATION   Essential hypertension   Acute on chronic renal failure   DM type 2 causing renal disease   CKD (chronic kidney disease) stage 4, GFR 15-29 ml/min   SIRS (systemic inflammatory response syndrome)   Hypothermia      Past Medical History  Diagnosis Date  . Paroxysmal atrial fibrillation   . PVD (peripheral vascular disease)     status post fem-fem bypass in sept/2009  . Junctional bradycardia     in the setting of hyperkalemia in the past  . GI bleeding   . Coronary artery disease   . Mitral regurgitation     status post annuloplasty ring.  . Pulmonary hypertension     moderate  . Hypertension   . Diabetes mellitus   . Renal insufficiency     in feb. 2008, acute on chronic  . Dysrhythmia     Past Surgical History  Procedure Laterality Date  . Coronary artery bypass graft      LIMA to the LAD, SVG to PDA, SVG to posterolateral, SVG to diagonal 2000  . Cholecystectomy    . Amputation      partial of right thumb  . Insertion of dialysis catheter Right 02/26/2014    Procedure: INSERTION OF DIALYSIS CATHETER RIGHT  IJ;  Surgeon: Angelia Mould, MD;  Location: [facility];  Service: Vascular;  Laterality: Right;       History of present illness and  Hospital Course:     Kindly see H&P for history of present illness and admission details, please review complete Labs, Consult reports and Test reports for all details in brief  HPI 78 y.o. female with PMH of CAD s/p CABG, S/p MVR, DM, PAF, PAD, CKD 4-5, Gout, Recent cellulitis(Rx with IV atx), recently discharged from the hospital on 10/10 after being treated for cellulitis and ARF, comes in for worsening pedal edema, and persistent cellulitis changes, and diarrhea, CHF, ESRD.     Hospital Course   1. Chronic cellulitis, dermatitis, skin ulcers due to chronic leg edema, PAD; sirs/sepsis on admisison  -improved on atx; blood cultures NGTD; afebrile;  -leg wounds look better; but ulcers, d/c IV atx changed to PO doxy (10/19); cont local wound care  -Continue aggressive wound care in the outpatient setting. Will require one time outpatient vascular surgery follow as well   2. ESRD, Hyponatremia, AG met  acidosis likely due CKD; US renal: Atrophic kidneys  -10/16: s/p right IJ Diatek HD cath; started HD had 3 dialysis  -appreciate vascular surgery, nephrology input; per vascular Pt is not a candidate for permanent access at this time  - She has been accepted to outpatient dialysis center. We'll follow with nephrology outpatient along with her dialysis schedule. Last dialysis to be done 03/04/2014 in the hospital.   3. CAD s/p CABG, PAD. patient denies any chest pain; cont monitor; added ASA , outpatient vascular surgery followup recommended post discharge in one to 2 weeks.   4. PAF, patient declined anticoagulation; HR stable; not on BB; started ASA    5. Acute on chronic CHF (systolic HF); echo: LVEF 16-10%; clinically improving' cont HD for volume control    6. DM, hypoglycemia likely due to PO glipizide; d/c glipizide; monitor and low carb  diet. Sliding scale insulin as needed.   7. Diarrhea probably atx associated, d/c atx; c diff negative; d/c flexseal (10/20) as needed Imodium.        Discharge Condition: stable   Follow UP  Follow-up Information   Follow up with Sherrie Mustache, MD. Schedule an appointment as soon as possible for a visit in 1 week. (Renal MD locally )    Specialty:  Family Medicine   Contact information:   [place] [address] [phone]       Follow up with EARLY, TODD, MD. Schedule an appointment as soon as possible for a visit in 1 week.   Specialty:  Vascular Surgery   Contact information:   [address] [place] [address] [phone]         Discharge Instructions  and  Discharge Medications         Discharge Instructions   Discharge instructions    Complete by:  As directed   Follow with Primary MD Sherrie Mustache, MD or SNF MD in 7 days   Get CBC, CMP, 2 view Chest X ray checked  by Primary MD next visit.    Activity: As tolerated with Full fall precautions use walker/cane & assistance as needed   Disposition SNF   Diet: Renal - Check your Weight same time everyday, if you gain over 2 pounds, or you develop in leg swelling, experience more shortness of breath or chest pain, call your Primary MD immediately. Follow Cardiac Low Salt Diet and 1.5 lit/day fluid restriction.   On your next visit with your primary care physician please Get Medicines reviewed and adjusted.   Please request your Prim.MD to go over all Hospital Tests and Procedure/Radiological results at the follow up, please get all Hospital records sent to your Prim MD by signing hospital release before you go home.   If you experience worsening of your admission symptoms, develop shortness of breath, life threatening emergency, suicidal or homicidal thoughts you must seek medical attention immediately by calling 911 or calling your MD immediately  if symptoms less  severe.  You Must read complete instructions/literature along with all the possible adverse reactions/side effects for all the Medicines you take and that have been prescribed to you. Take any new Medicines after you have completely understood and accpet all the possible adverse reactions/side effects.   Do not drive, operating heavy machinery, perform activities at heights, swimming or participation in water activities or provide baby sitting services if your were admitted for syncope or siezures until you have seen by Primary MD or a Neurologist and advised to do so  again.  Do not drive when taking Pain medications.    Do not take more than prescribed Pain, Sleep and Anxiety Medications  Special Instructions: If you have smoked or chewed Tobacco  in the last 2 yrs please stop smoking, stop any regular Alcohol  and or any Recreational drug use.  Wear Seat belts while driving.   Please note  You were cared for by a hospitalist during your hospital stay. If you have any questions about your discharge medications or the care you received while you were in the hospital after you are discharged, you can call the unit and asked to speak with the hospitalist on call if the hospitalist that took care of you is not available. Once you are discharged, your primary care physician will handle any further medical issues. Please note that NO REFILLS for any discharge medications will be authorized once you are discharged, as it is imperative that you return to your primary care physician (or establish a relationship with a primary care physician if you do not have one) for your aftercare needs so that they can reassess your need for medications and monitor your lab values.     Increase activity slowly    Complete by:  As directed             Medication List    STOP taking these medications       furosemide 80 MG tablet  Commonly known as:  LASIX     glipiZIDE 10 MG 24 hr tablet  Commonly known  as:  GLUCOTROL XL      TAKE these medications       amLODipine 10 MG tablet  Commonly known as:  NORVASC  Take 10 mg by mouth every morning.     aspirin EC 81 MG tablet  Take 1 tablet (81 mg total) by mouth daily.     doxycycline 100 MG tablet  Commonly known as:  VIBRA-TABS  Take 1 tablet (100 mg total) by mouth every 12 (twelve) hours.     feeding supplement (PRO-STAT SUGAR FREE 64) Liqd  Take 30 mLs by mouth 3 (three) times daily with meals.     insulin aspart 100 UNIT/ML FlexPen  Commonly known as:  NOVOLOG FLEXPEN  - Before each meal 3 times a day, 140-199 - 2 units, 200-250 - 4 units, 251-299 - 6 units,  300-349 - 8 units,  350 or above 10 units.  - Insulin PEN if approved, provide syringes and needles if needed.     loperamide 2 MG capsule  Commonly known as:  IMODIUM  Take 1 capsule (2 mg total) by mouth as needed for diarrhea or loose stools.     mometasone 0.1 % cream  Commonly known as:  ELOCON  Apply 1 application topically 2 (two) times daily.     oxymetazoline 0.05 % nasal spray  Commonly known as:  AFRIN  Place 1 spray into both nostrils 2 (two) times daily.          Diet and Activity recommendation: See Discharge Instructions above   Consults obtained - Vascular, renal   Major procedures and Radiology Reports - PLEASE review detailed and final reports for all details, in brief -       Dg Ankle Complete Right  02/16/2014   CLINICAL DATA:  Patient fell while walking with a walker 4 days ago. Edema and erythema involving the right ankle. Initial encounter.  EXAM: RIGHT ANKLE - COMPLETE 3+ VIEW  COMPARISON:  None.  FINDINGS: Minimally displaced avulsion fracture involving the tip of the lateral malleolus. No other fractures. Ankle mortise intact with mild joint space narrowing. Generalized osseous demineralization. Small joint effusion. Small plantar calcaneal spur. Extensive calcification involving the tibioperoneal arteries and the dorsalis pedis  and plantar arteries.  IMPRESSION: Minimally displaced avulsion fracture involving the tip of the lateral malleolus. No other fractures. Osseous demineralization.   Electronically Signed   By: Evangeline Dakin M.D.   On: 02/16/2014 20:38   US Renal  02/24/2014   CLINICAL DATA:  Increased creatinine, diabetes, hypertension, chronic renal failure  EXAM: RENAL/URINARY TRACT ULTRASOUND COMPLETE  COMPARISON:  None.  FINDINGS: Right Kidney:  Length: 8.9 cm. Increased renal cortical echogenicity. Multiple shadowing echogenic foci consistent with nephrolithiasis with the largest measuring 5 mm. No mass or hydronephrosis visualized.  Left Kidney:  Length: 8.9 cm. Increased renal cortical echogenicity. There is a 2.3 x 2.1 x 2.5 cm anechoic left renal mass most consistent with a cyst. There are a few echogenic foci in the left kidney likely reflecting nephrolithiasis. No hydronephrosis visualized.  Bladder:  Appears normal for degree of bladder distention.  IMPRESSION: 1. Bilateral atrophic kidneys and increased renal cortical echogenicity as can be seen with medical renal disease. 2. Bilateral nephrolithiasis.   Electronically Signed   By: Kathreen Devoid   On: 02/24/2014 21:38      US Venous Img Lower Bilateral  02/17/2014   CLINICAL DATA:  Bilateral lower extremity pain and swelling.  EXAM: BILATERAL LOWER EXTREMITY VENOUS DOPPLER ULTRASOUND  TECHNIQUE: Gray-scale sonography with graded compression, as well as color Doppler and duplex ultrasound were performed to evaluate the lower extremity deep venous systems from the level of the common femoral vein and including the common femoral, femoral, profunda femoral, popliteal and calf veins including the posterior tibial, peroneal and gastrocnemius veins when visible. The superficial great saphenous vein was also interrogated. Spectral Doppler was utilized to evaluate flow at rest and with distal augmentation maneuvers in the common femoral, femoral and popliteal veins.   COMPARISON:  None.  FINDINGS: RIGHT LOWER EXTREMITY  Common Femoral Vein: No evidence of thrombus. Normal compressibility, respiratory phasicity and response to augmentation.  Saphenofemoral Junction: No evidence of thrombus. Normal compressibility and flow on color Doppler imaging.  Profunda Femoral Vein: No evidence of thrombus. Normal compressibility and flow on color Doppler imaging.  Femoral Vein: No evidence of thrombus. Normal compressibility, respiratory phasicity and response to augmentation.  Popliteal Vein: No evidence of thrombus. Normal compressibility, respiratory phasicity and response to augmentation.  Calf Veins: No evidence of thrombus. Normal compressibility and flow on color Doppler imaging.  Superficial Great Saphenous Vein: No evidence of thrombus. Normal compressibility and flow on color Doppler imaging.  Venous Reflux:  None.  Other Findings:  None.  LEFT LOWER EXTREMITY  Common Femoral Vein: No evidence of thrombus. Normal compressibility, respiratory phasicity and response to augmentation.  Saphenofemoral Junction: No evidence of thrombus. Normal compressibility and flow on color Doppler imaging.  Profunda Femoral Vein: No evidence of thrombus. Normal compressibility and flow on color Doppler imaging.  Femoral Vein: No evidence of thrombus. Normal compressibility, respiratory phasicity and response to augmentation.  Popliteal Vein: No evidence of thrombus. Normal compressibility, respiratory phasicity and response to augmentation.  Calf Veins: No evidence of thrombus. Normal compressibility and flow on color Doppler imaging.  Superficial Great Saphenous Vein: No evidence of thrombus. Normal compressibility and flow on color Doppler imaging.  Venous Reflux:  None.  Other Findings:  None.  IMPRESSION: No evidence of deep venous thrombosis seen in either lower extremity.   Electronically Signed   By: Sabino Dick M.D.   On: 02/17/2014 16:59   US Arterial Seg Single  02/17/2014   CLINICAL  DATA:  Lower extremity claudication and rest pain bilaterally.  EXAM: NONINVASIVE PHYSIOLOGIC VASCULAR STUDY OF BILATERAL LOWER EXTREMITIES  TECHNIQUE: Evaluation of both lower extremities were performed at rest, including calculation of ankle-brachial indices with single level Doppler, pressure and pulse volume recording.  COMPARISON:  None.  FINDINGS: Right ABI: Ankle pressures are markedly elevated. ABI measurements are spurious.  Left ABI: Ankle pressures are markedly elevated. ABI measurements or spurious.  Right Lower Extremity: Posterior tibial and dorsalis pedis Doppler waveforms are monophasic.  Left Lower Extremity: Posterior tibial and dorsalis pedis waveforms are monophasic.  IMPRESSION: Severe bilateral lower extremity arterial occlusive disease.   Electronically Signed   By: Maryclare Bean M.D.   On: 02/17/2014 17:02   Dg Chest Port 1 View  02/26/2014   CLINICAL DATA:  Status post insertion of a dialysis catheter on the right.  EXAM: PORTABLE CHEST - 1 VIEW  COMPARISON:  Single view of the chest 02/24/2014.  FINDINGS: Right IJ approach double lumen central venous catheter is in place. Tip of the distal lumen projects in the right atrium with the proximal lumen just below the superior cavoatrial junction. There is cardiomegaly and vascular congestion. No pneumothorax. Trace right pleural effusion is noted.  IMPRESSION: Dialysis catheter in place as above.  Negative for pneumothorax.  Cardiomegaly and vascular congestion.  Trace right pleural effusion.   Electronically Signed   By: Inge Rise M.D.   On: 02/26/2014 15:30   Dg Chest Portable 1 View  02/24/2014   CLINICAL DATA:  Fatigue, nausea, and vomiting.  EXAM: PORTABLE CHEST - 1 VIEW  COMPARISON:  Two-view chest 06/15/2008  FINDINGS: The heart is enlarged. The size is exaggerated by low lung volumes. Atherosclerotic changes are again noted at the aortic arch. Diffuse interstitial coarsening is chronic. This is exaggerated by the low lung  volumes. No focal airspace disease is evident. The visualized soft tissues and bony thorax are unremarkable.  IMPRESSION: 1. Low lung volumes. 2. Stable cardiomegaly without failure.   Electronically Signed   By: Lawrence Santiago M.D.   On: 02/24/2014 11:17    Micro Results      Recent Results (from the past 240 hour(s))  URINE CULTURE     Status: None   Collection Time    02/24/14 10:51 AM      Result Value Ref Range Status   Specimen Description URINE, CATHETERIZED   Final   Special Requests NONE   Final   Culture  Setup Time     Final   Value: 02/24/2014 12:18     Performed at [facility]     Final   Value: NO GROWTH     Performed at [facility]   Culture     Final   Value: NO GROWTH     Performed at [facility]   Report Status 02/25/2014 FINAL   Final  CULTURE, BLOOD (ROUTINE X 2)     Status: None   Collection Time    02/24/14 11:05 AM      Result Value Ref Range Status   Specimen Description BLOOD RIGHT ARM   Final   Special Requests BOTTLES DRAWN AEROBIC AND ANAEROBIC 10ML   Final   Culture  Setup Time     Final   Value: 02/24/2014 17:27     Performed at [facility]   Culture     Final   Value: NO GROWTH 5 DAYS     Performed at [facility]   Report Status 03/02/2014 FINAL   Final  CULTURE, BLOOD (ROUTINE X 2)     Status: None   Collection Time    02/24/14 11:45 AM      Result Value Ref Range Status   Specimen Description BLOOD LEFT ARM   Final   Special Requests BOTTLES DRAWN AEROBIC AND ANAEROBIC 10ML   Final   Culture  Setup Time     Final   Value: 02/24/2014 17:29     Performed at [facility]   Culture     Final   Value: NO GROWTH 5 DAYS     Performed at [facility]   Report Status 03/02/2014 FINAL   Final  CLOSTRIDIUM DIFFICILE BY PCR     Status: None   Collection Time    02/25/14  4:56 AM      Result Value Ref Range Status   C difficile by pcr NEGATIVE  NEGATIVE Final        Today   Subjective:   [name] today has no headache,no chest abdominal pain,no new weakness tingling or numbness, feels much better.  Objective:   Blood pressure 134/55, pulse 60, temperature 97.6 F (36.4 C), temperature source Oral, resp. rate 16, height 5' 4.96" (1.65 m), weight 77.8 kg (171 lb 8.3 oz), SpO2 99.00%.   Intake/Output Summary (Last 24 hours) at 03/04/14 1126 Last data filed at 03/03/14 1830  Gross per 24 hour  Intake    360 ml  Output      0 ml  Net    360 ml    Exam Awake Alert, Oriented x 3, No new F.N deficits, Normal affect [place].AT,PERRAL Supple Neck,No JVD, No cervical lymphadenopathy appriciated.  Symmetrical Chest wall movement, Good air movement bilaterally, CTAB RRR,No Gallops,Rubs or new Murmurs, No Parasternal Heave +ve B.Sounds, Abd Soft, Non tender, No organomegaly appriciated, No rebound -guarding or rigidity. No Cyanosis, Clubbing or edema, No new Rash or bruise, chronic multiple skin breakdown in all 4 extremities  Data Review   CBC w Diff: Lab Results  Component Value Date   WBC 13.5* 03/03/2014   HGB 9.8* 03/03/2014   HCT 28.9* 03/03/2014   PLT 147* 03/03/2014   LYMPHOPCT 4* 02/24/2014   MONOPCT 3 02/24/2014   EOSPCT 0 02/24/2014   BASOPCT 0 02/24/2014    CMP: Lab Results  Component Value Date   NA 135* 03/03/2014   K 3.4* 03/03/2014   CL 97 03/03/2014   CO2 24 03/03/2014   BUN 45* 03/03/2014   CREATININE 2.52* 03/03/2014   PROT 5.3* 02/24/2014   ALBUMIN 1.6* 03/01/2014   BILITOT 0.7 02/24/2014   ALKPHOS 190* 02/24/2014   AST 48* 02/24/2014   ALT 45* 02/24/2014  .   Total Time in preparing paper work, data evaluation and todays exam - 35 minutes  Thurnell Lose M.D on 03/04/2014 at 11:26 AM  [facility] Office  [phone]

## 2014-03-04 NOTE — Progress Notes (Signed)
Per Child psychotherapistsocial worker, Clinical research associatewriter called [facility] to transport patient and also called report to nurse Norwood LevoEvelyn Dodson [facility] [place]. Patient has no IV access and tele d/c'd. Awaiting transportation at this time.

## 2014-03-04 NOTE — Plan of Care (Signed)
Problem: Phase II Progression Outcomes Goal: Progress activity as tolerated unless otherwise ordered Outcome: Progressing Patient up to chair with 2 person assist and walker

## 2014-03-04 NOTE — Progress Notes (Signed)
Patient ID: [name], female    DOB: 1935-11-17, 78 y.o.   MRN: [id number]  S: No complaints this morning.  O:BP 134/55  Pulse 60  Temp(Src) 97.6 F (36.4 C) (Oral)  Resp 16  Ht 5' 4.96" (1.65 m)  Wt 171 lb 8.3 oz (77.8 kg)  BMI 28.58 kg/m2  SpO2 99%  Intake/Output Summary (Last 24 hours) at 03/04/14 1218 Last data filed at 03/03/14 1830  Gross per 24 hour  Intake    360 ml  Output      0 ml  Net    360 ml   Intake/Output: I/O last 3 completed shifts: In: 1060 [P.O.:820; I.V.:240] Out: -   Intake/Output this shift:    Weight change:  Gen: NAD HEENT: left supraclavicular swelling, reducible CVS: RRR. PC right chest Resp: CTAB Abd: soft, +BS Ext: trace edema, bandage right ankle. Skin: LE cellulitis   Recent Labs Lab 02/26/14 0600 02/27/14 0338 02/28/14 0416 03/01/14 0730 03/01/14 1504 03/03/14 0320  NA 122* 130* 134* 133* 132* 135*  K 4.3 3.3* 3.6* 3.8 3.5* 3.4*  CL 86* 92* 96 96 94* 97  CO2 13* 19 21 19 21 24   GLUCOSE 286* 32* 98 49* 104* 99  BUN 122* 81* 56* 61* 64* 45*  CREATININE 4.41* 3.19* 2.56* 2.96* 3.08* 2.52*  ALBUMIN  --   --   --   --  1.6*  --   CALCIUM 6.6* 7.0* 6.9* 6.9* 7.0* 7.2*  PHOS  --   --   --   --  4.4  --    Liver Function Tests:  Recent Labs Lab 03/01/14 1504  ALBUMIN 1.6*   No results found for this basename: LIPASE, AMYLASE,  in the last 168 hours No results found for this basename: AMMONIA,  in the last 168 hours CBC:  Recent Labs Lab 02/27/14 0338 03/01/14 0730 03/01/14 1504 03/03/14 0320  WBC 15.2* 16.0* 16.9* 13.5*  HGB 10.6* 10.2* 9.7* 9.8*  HCT 29.8* 29.4* 27.9* 28.9*  MCV 74.3* 78.2 79.0 79.8  PLT 227 153 153 147*   Cardiac Enzymes: No results found for this basename: CKTOTAL, CKMB, CKMBINDEX, TROPONINI,  in the last 168 hours CBG:  Recent Labs Lab 03/03/14 1258 03/03/14 1731 03/03/14 2215 03/04/14 0749 03/04/14 1157  GLUCAP 171* 95 128* 71 120*    Iron Studies:   Recent Labs   03/01/14 1504  IRON 42  TIBC 140*  FERRITIN 341*   Studies/Results: No results found. . collagenase   Topical Daily  . doxycycline  100 mg Oral Q12H  . feeding supplement (PRO-STAT SUGAR FREE 64)  30 mL Oral TID WC  . [START ON 03/05/2014] heparin subcutaneous  5,000 Units Subcutaneous 3 times per day  . insulin aspart  0-9 Units Subcutaneous TID WC  . oxymetazoline  1 spray Each Nare BID    BMET    Component Value Date/Time   NA 135* 03/03/2014 0320   K 3.4* 03/03/2014 0320   CL 97 03/03/2014 0320   CO2 24 03/03/2014 0320   GLUCOSE 99 03/03/2014 0320   BUN 45* 03/03/2014 0320   CREATININE 2.52* 03/03/2014 0320   CALCIUM 7.2* 03/03/2014 0320   GFRNONAA 17* 03/03/2014 0320   GFRAA 20* 03/03/2014 0320   CBC    Component Value Date/Time   WBC 13.5* 03/03/2014 0320   RBC 3.62* 03/03/2014 0320   HGB 9.8* 03/03/2014 0320   HCT 28.9* 03/03/2014 0320   PLT 147* 03/03/2014 0320  MCV 79.8 03/03/2014 0320   MCH 27.1 03/03/2014 0320   MCHC 33.9 03/03/2014 0320   RDW 15.5 03/03/2014 0320   LYMPHSABS 0.4* 02/24/2014 1105   MONOABS 0.3 02/24/2014 1105   EOSABS 0.0 02/24/2014 1105   BASOSABS 0.0 02/24/2014 1105     Assessment/Plan: 78 y.o. female with CKD, DM, PAF, PVD; admitted 10/14 for recurrent cellulitis and ARF. She is now ESRD and requiring HD.  1. ESRD: on HD with [facility], 3 sessions so far. Outpt HD setup with [facility], plan for HD today. Discharge pending bed placement. 2. Hyponatremia: improved on HD. 3. Hypokalemia: continue K bath with HD 4. Anemia: stable, likely needs epo as outpt. 5. 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  [facility]. [facility]F/AVG deferred because of cellulitis. 7. HTN: Stable 8. Cellulitis: on abx (vanc/zosyn) per primary 9. Severe protein calorie malnutrition  Tawni CarnesWight, Andrew  Renal Attending: She is weak requiring help with transfers. Dialysis planned for today [facility]  [facility].Marland Kitchen. Ilija Maxim C

## 2014-03-15 ENCOUNTER — Telehealth: Payer: Self-pay | Admitting: Vascular Surgery

## 2014-03-16 ENCOUNTER — Ambulatory Visit: Payer: Medicare Other | Admitting: Vascular Surgery

## 2014-04-13 NOTE — Telephone Encounter (Signed)
[facility] to let us know that Nanci PinaLorine Scalzo passed away on 04/12/2014. She has an appointment set up for tomorrow 03/16/14 that we need to cancel.  Thanks, Revonda StandardAllison

## 2014-04-13 DEATH — deceased
